# Patient Record
Sex: Male | Born: 1940 | Race: White | Hispanic: No | Marital: Married | Smoking: Never smoker
Health system: Southern US, Community
[De-identification: ages and names within clinical notes are randomized; demographics above are authoritative.]

## PROBLEM LIST (undated history)

## (undated) DIAGNOSIS — K5730 Diverticulosis of large intestine without perforation or abscess without bleeding: Secondary | ICD-10-CM

## (undated) DIAGNOSIS — I8390 Asymptomatic varicose veins of unspecified lower extremity: Secondary | ICD-10-CM

## (undated) DIAGNOSIS — Z8679 Personal history of other diseases of the circulatory system: Secondary | ICD-10-CM

## (undated) DIAGNOSIS — C61 Malignant neoplasm of prostate: Secondary | ICD-10-CM

## (undated) DIAGNOSIS — I739 Peripheral vascular disease, unspecified: Secondary | ICD-10-CM

## (undated) DIAGNOSIS — Z86010 Personal history of colonic polyps: Secondary | ICD-10-CM

## (undated) DIAGNOSIS — I498 Other specified cardiac arrhythmias: Secondary | ICD-10-CM

## (undated) DIAGNOSIS — T7840XA Allergy, unspecified, initial encounter: Secondary | ICD-10-CM

## (undated) DIAGNOSIS — I2510 Atherosclerotic heart disease of native coronary artery without angina pectoris: Secondary | ICD-10-CM

## (undated) DIAGNOSIS — E785 Hyperlipidemia, unspecified: Secondary | ICD-10-CM

## (undated) DIAGNOSIS — G459 Transient cerebral ischemic attack, unspecified: Secondary | ICD-10-CM

## (undated) HISTORY — DX: Transient cerebral ischemic attack, unspecified: G45.9

## (undated) HISTORY — DX: Diverticulosis of large intestine without perforation or abscess without bleeding: K57.30

## (undated) HISTORY — DX: Peripheral vascular disease, unspecified: I73.9

## (undated) HISTORY — DX: Malignant neoplasm of prostate: C61

## (undated) HISTORY — DX: Asymptomatic varicose veins of unspecified lower extremity: I83.90

## (undated) HISTORY — DX: Atherosclerotic heart disease of native coronary artery without angina pectoris: I25.10

## (undated) HISTORY — DX: Allergy, unspecified, initial encounter: T78.40XA

## (undated) HISTORY — DX: Hyperlipidemia, unspecified: E78.5

## (undated) HISTORY — DX: Other specified cardiac arrhythmias: I49.8

## (undated) HISTORY — DX: Personal history of other diseases of the circulatory system: Z86.79

## (undated) HISTORY — PX: POLYPECTOMY: SHX149

## (undated) HISTORY — DX: Personal history of colonic polyps: Z86.010

---

## 2003-04-08 ENCOUNTER — Ambulatory Visit (HOSPITAL_COMMUNITY): Admission: RE | Admit: 2003-04-08 | Discharge: 2003-04-08 | Payer: Self-pay | Admitting: Gastroenterology

## 2003-04-24 ENCOUNTER — Encounter: Admission: RE | Admit: 2003-04-24 | Discharge: 2003-04-24 | Payer: Self-pay | Admitting: General Surgery

## 2003-05-02 ENCOUNTER — Encounter (INDEPENDENT_AMBULATORY_CARE_PROVIDER_SITE_OTHER): Payer: Self-pay | Admitting: *Deleted

## 2003-05-02 ENCOUNTER — Ambulatory Visit (HOSPITAL_COMMUNITY): Admission: RE | Admit: 2003-05-02 | Discharge: 2003-05-02 | Payer: Self-pay | Admitting: General Surgery

## 2003-11-21 ENCOUNTER — Ambulatory Visit (HOSPITAL_COMMUNITY): Admission: RE | Admit: 2003-11-21 | Discharge: 2003-11-21 | Payer: Self-pay | Admitting: Internal Medicine

## 2003-11-21 ENCOUNTER — Ambulatory Visit: Payer: Self-pay | Admitting: Internal Medicine

## 2003-11-22 ENCOUNTER — Ambulatory Visit (HOSPITAL_COMMUNITY): Admission: RE | Admit: 2003-11-22 | Discharge: 2003-11-22 | Payer: Self-pay | Admitting: Internal Medicine

## 2003-11-26 ENCOUNTER — Ambulatory Visit: Payer: Self-pay

## 2003-11-28 ENCOUNTER — Ambulatory Visit: Payer: Self-pay | Admitting: Internal Medicine

## 2003-12-10 ENCOUNTER — Ambulatory Visit (HOSPITAL_COMMUNITY): Admission: RE | Admit: 2003-12-10 | Discharge: 2003-12-10 | Payer: Self-pay | Admitting: Neurology

## 2004-01-30 ENCOUNTER — Ambulatory Visit: Payer: Self-pay | Admitting: Internal Medicine

## 2005-11-04 ENCOUNTER — Ambulatory Visit: Payer: Self-pay | Admitting: Gastroenterology

## 2005-11-04 ENCOUNTER — Ambulatory Visit: Payer: Self-pay | Admitting: Internal Medicine

## 2005-11-04 LAB — CONVERTED CEMR LAB
ALT: 20 units/L (ref 0–40)
AST: 21 units/L (ref 0–37)
Albumin: 3.9 g/dL (ref 3.5–5.2)
Alkaline Phosphatase: 61 units/L (ref 39–117)
BUN: 7 mg/dL (ref 6–23)
Bacteria, U Microscopic: NEGATIVE /hpf
Basophils Absolute: 0 10*3/uL (ref 0.0–0.1)
Basophils Relative: 0.2 % (ref 0.0–1.0)
Bilirubin Urine: NEGATIVE
CO2: 30 meq/L (ref 19–32)
Calcium: 9.3 mg/dL (ref 8.4–10.5)
Chloride: 105 meq/L (ref 96–112)
Chol/HDL Ratio, serum: 7.1
Cholesterol: 258 mg/dL (ref 0–200)
Creatinine, Ser: 1.2 mg/dL (ref 0.4–1.5)
Crystals: NEGATIVE
Eosinophil percent: 3.1 % (ref 0.0–5.0)
GFR calc non Af Amer: 65 mL/min
Glomerular Filtration Rate, Af Am: 78 mL/min/{1.73_m2}
Glucose, Bld: 102 mg/dL — ABNORMAL HIGH (ref 70–99)
HCT: 47.8 % (ref 39.0–52.0)
HDL: 36.5 mg/dL — ABNORMAL LOW (ref >39.0)
Hemoglobin, Urine: NEGATIVE
Hemoglobin: 16.1 g/dL (ref 13.0–17.0)
Ketones, ur: NEGATIVE mg/dL
LDL DIRECT: 190.2 mg/dL
Lymphocytes Relative: 18.8 % (ref 12.0–46.0)
MCHC: 33.7 g/dL (ref 30.0–36.0)
MCV: 97.7 fL (ref 78.0–100.0)
Monocytes Absolute: 0.6 10*3/uL (ref 0.2–0.7)
Monocytes Relative: 13.1 % — ABNORMAL HIGH (ref 3.0–11.0)
Mucus, UA: NEGATIVE
Neutro Abs: 3.2 10*3/uL (ref 1.4–7.7)
Neutrophils Relative %: 64.8 % (ref 43.0–77.0)
Nitrite: NEGATIVE
PSA: 8.24 ng/mL — ABNORMAL HIGH (ref 0.10–4.00)
Platelets: 258 10*3/uL (ref 150–400)
Potassium: 4.2 meq/L (ref 3.5–5.1)
RBC / HPF: NONE SEEN
RBC: 4.89 M/uL (ref 4.22–5.81)
RDW: 11.7 % (ref 11.5–14.6)
Sodium: 140 meq/L (ref 135–145)
Specific Gravity, Urine: 1.01 (ref 1.000–1.03)
TSH: 1.99 microintl units/mL (ref 0.35–5.50)
Total Bilirubin: 0.8 mg/dL (ref 0.3–1.2)
Total Protein, Urine: NEGATIVE mg/dL
Total Protein: 6.7 g/dL (ref 6.0–8.3)
Triglyceride fasting, serum: 110 mg/dL (ref 0–149)
Urine Glucose: NEGATIVE mg/dL
Urobilinogen, UA: 0.2 (ref 0.0–1.0)
VLDL: 22 mg/dL (ref 0–40)
WBC: 4.8 10*3/uL (ref 4.5–10.5)
pH: 6 (ref 5.0–8.0)

## 2005-11-09 ENCOUNTER — Ambulatory Visit: Payer: Self-pay | Admitting: Internal Medicine

## 2005-11-16 ENCOUNTER — Ambulatory Visit: Payer: Self-pay | Admitting: Gastroenterology

## 2005-12-16 ENCOUNTER — Ambulatory Visit: Payer: Self-pay | Admitting: Internal Medicine

## 2005-12-16 LAB — CONVERTED CEMR LAB
ALT: 28 units/L (ref 0–40)
AST: 26 units/L (ref 0–37)
Chol/HDL Ratio, serum: 4.1
Cholesterol: 199 mg/dL (ref 0–200)
HDL: 48.4 mg/dL (ref >39.0)
LDL Cholesterol: 138 mg/dL — ABNORMAL HIGH (ref 0–99)
Triglyceride fasting, serum: 61 mg/dL (ref 0–149)
VLDL: 12 mg/dL (ref 0–40)

## 2006-11-29 ENCOUNTER — Encounter: Payer: Self-pay | Admitting: Internal Medicine

## 2006-12-18 DIAGNOSIS — Z860100 Personal history of colon polyps, unspecified: Secondary | ICD-10-CM

## 2006-12-18 DIAGNOSIS — E785 Hyperlipidemia, unspecified: Secondary | ICD-10-CM

## 2006-12-18 HISTORY — DX: Personal history of colon polyps, unspecified: Z86.0100

## 2006-12-18 HISTORY — DX: Personal history of colonic polyps: Z86.010

## 2006-12-18 HISTORY — DX: Hyperlipidemia, unspecified: E78.5

## 2007-03-13 ENCOUNTER — Encounter: Payer: Self-pay | Admitting: Internal Medicine

## 2007-07-24 ENCOUNTER — Encounter: Payer: Self-pay | Admitting: Internal Medicine

## 2007-08-01 ENCOUNTER — Ambulatory Visit: Admission: RE | Admit: 2007-08-01 | Discharge: 2007-09-12 | Payer: Self-pay | Admitting: Radiation Oncology

## 2007-08-01 ENCOUNTER — Encounter: Payer: Self-pay | Admitting: Internal Medicine

## 2007-09-20 ENCOUNTER — Ambulatory Visit: Payer: Self-pay | Admitting: Internal Medicine

## 2007-09-20 DIAGNOSIS — R5383 Other fatigue: Secondary | ICD-10-CM

## 2007-09-20 DIAGNOSIS — R5381 Other malaise: Secondary | ICD-10-CM

## 2007-09-20 DIAGNOSIS — I498 Other specified cardiac arrhythmias: Secondary | ICD-10-CM

## 2007-09-20 DIAGNOSIS — Z8679 Personal history of other diseases of the circulatory system: Secondary | ICD-10-CM

## 2007-09-20 DIAGNOSIS — K5730 Diverticulosis of large intestine without perforation or abscess without bleeding: Secondary | ICD-10-CM

## 2007-09-20 DIAGNOSIS — C61 Malignant neoplasm of prostate: Secondary | ICD-10-CM

## 2007-09-20 DIAGNOSIS — I739 Peripheral vascular disease, unspecified: Secondary | ICD-10-CM

## 2007-09-20 HISTORY — DX: Diverticulosis of large intestine without perforation or abscess without bleeding: K57.30

## 2007-09-20 HISTORY — DX: Malignant neoplasm of prostate: C61

## 2007-09-20 HISTORY — DX: Other specified cardiac arrhythmias: I49.8

## 2007-09-20 HISTORY — DX: Personal history of other diseases of the circulatory system: Z86.79

## 2007-09-20 HISTORY — DX: Peripheral vascular disease, unspecified: I73.9

## 2007-09-27 ENCOUNTER — Emergency Department (HOSPITAL_COMMUNITY): Admission: EM | Admit: 2007-09-27 | Discharge: 2007-09-27 | Payer: Self-pay | Admitting: Emergency Medicine

## 2007-11-16 ENCOUNTER — Ambulatory Visit: Payer: Self-pay | Admitting: Internal Medicine

## 2007-12-03 ENCOUNTER — Ambulatory Visit: Payer: Self-pay | Admitting: Internal Medicine

## 2007-12-03 DIAGNOSIS — G459 Transient cerebral ischemic attack, unspecified: Secondary | ICD-10-CM

## 2007-12-03 DIAGNOSIS — R209 Unspecified disturbances of skin sensation: Secondary | ICD-10-CM

## 2007-12-03 HISTORY — DX: Transient cerebral ischemic attack, unspecified: G45.9

## 2007-12-07 ENCOUNTER — Encounter: Payer: Self-pay | Admitting: Internal Medicine

## 2007-12-26 ENCOUNTER — Ambulatory Visit: Payer: Self-pay

## 2007-12-26 ENCOUNTER — Encounter: Payer: Self-pay | Admitting: Internal Medicine

## 2008-04-04 ENCOUNTER — Encounter: Payer: Self-pay | Admitting: Internal Medicine

## 2008-08-07 ENCOUNTER — Encounter: Payer: Self-pay | Admitting: Internal Medicine

## 2008-10-07 ENCOUNTER — Encounter (INDEPENDENT_AMBULATORY_CARE_PROVIDER_SITE_OTHER): Payer: Self-pay | Admitting: *Deleted

## 2008-12-08 ENCOUNTER — Encounter: Payer: Self-pay | Admitting: Internal Medicine

## 2009-04-15 ENCOUNTER — Ambulatory Visit: Payer: Self-pay | Admitting: Internal Medicine

## 2009-04-15 DIAGNOSIS — T783XXA Angioneurotic edema, initial encounter: Secondary | ICD-10-CM | POA: Insufficient documentation

## 2009-04-20 ENCOUNTER — Encounter (INDEPENDENT_AMBULATORY_CARE_PROVIDER_SITE_OTHER): Payer: Self-pay | Admitting: *Deleted

## 2009-05-20 ENCOUNTER — Encounter (INDEPENDENT_AMBULATORY_CARE_PROVIDER_SITE_OTHER): Payer: Self-pay | Admitting: *Deleted

## 2009-05-22 ENCOUNTER — Ambulatory Visit: Payer: Self-pay | Admitting: Gastroenterology

## 2009-06-01 ENCOUNTER — Encounter: Payer: Self-pay | Admitting: Internal Medicine

## 2009-06-05 ENCOUNTER — Ambulatory Visit: Payer: Self-pay | Admitting: Gastroenterology

## 2009-06-05 HISTORY — PX: COLONOSCOPY: SHX174

## 2009-06-09 ENCOUNTER — Encounter: Payer: Self-pay | Admitting: Gastroenterology

## 2009-10-02 ENCOUNTER — Encounter: Payer: Self-pay | Admitting: Internal Medicine

## 2009-10-23 ENCOUNTER — Encounter: Payer: Self-pay | Admitting: Internal Medicine

## 2010-01-28 ENCOUNTER — Encounter: Payer: Self-pay | Admitting: Internal Medicine

## 2010-02-06 ENCOUNTER — Encounter: Payer: Self-pay | Admitting: Neurology

## 2010-02-06 ENCOUNTER — Encounter: Payer: Self-pay | Admitting: Internal Medicine

## 2010-02-14 LAB — CONVERTED CEMR LAB
ALT: 20 units/L (ref 0–53)
ALT: 21 units/L (ref 0–53)
AST: 20 units/L (ref 0–37)
AST: 22 units/L (ref 0–37)
Albumin: 4 g/dL (ref 3.5–5.2)
Albumin: 4.2 g/dL (ref 3.5–5.2)
Alkaline Phosphatase: 58 units/L (ref 39–117)
Alkaline Phosphatase: 65 units/L (ref 39–117)
BUN: 7 mg/dL (ref 6–23)
BUN: 9 mg/dL (ref 6–23)
Basophils Absolute: 0 10*3/uL (ref 0.0–0.1)
Basophils Absolute: 0 10*3/uL (ref 0.0–0.1)
Basophils Relative: 0.2 % (ref 0.0–3.0)
Basophils Relative: 0.5 % (ref 0.0–3.0)
Bilirubin Urine: NEGATIVE
Bilirubin Urine: NEGATIVE
Bilirubin, Direct: 0.1 mg/dL (ref 0.0–0.3)
Bilirubin, Direct: 0.1 mg/dL (ref 0.0–0.3)
CO2: 29 meq/L (ref 19–32)
CO2: 31 meq/L (ref 19–32)
Calcium: 8.7 mg/dL (ref 8.4–10.5)
Calcium: 9 mg/dL (ref 8.4–10.5)
Chloride: 102 meq/L (ref 96–112)
Chloride: 104 meq/L (ref 96–112)
Cholesterol: 211 mg/dL — ABNORMAL HIGH (ref 0–200)
Cholesterol: 219 mg/dL (ref 0–200)
Creatinine, Ser: 1 mg/dL (ref 0.4–1.5)
Creatinine, Ser: 1.1 mg/dL (ref 0.4–1.5)
Direct LDL: 161.1 mg/dL
Direct LDL: 207.7 mg/dL
Eosinophils Absolute: 0.2 10*3/uL (ref 0.0–0.7)
Eosinophils Absolute: 0.2 10*3/uL (ref 0.0–0.7)
Eosinophils Relative: 3.5 % (ref 0.0–5.0)
Eosinophils Relative: 3.9 % (ref 0.0–5.0)
GFR calc Af Amer: 86 mL/min
GFR calc non Af Amer: 71 mL/min
GFR calc non Af Amer: 78.86 mL/min (ref >60)
Glucose, Bld: 91 mg/dL (ref 70–99)
Glucose, Bld: 93 mg/dL (ref 70–99)
HCT: 46.2 % (ref 39.0–52.0)
HCT: 49.5 % (ref 39.0–52.0)
HDL: 36.1 mg/dL — ABNORMAL LOW (ref >39.0)
HDL: 40.7 mg/dL (ref >39.00)
Hemoglobin, Urine: NEGATIVE
Hemoglobin, Urine: NEGATIVE
Hemoglobin: 15.4 g/dL (ref 13.0–17.0)
Hemoglobin: 17.1 g/dL — ABNORMAL HIGH (ref 13.0–17.0)
Ketones, ur: NEGATIVE mg/dL
Ketones, ur: NEGATIVE mg/dL
Leukocytes, UA: NEGATIVE
Leukocytes, UA: NEGATIVE
Lymphocytes Relative: 15.9 % (ref 12.0–46.0)
Lymphocytes Relative: 17.2 % (ref 12.0–46.0)
Lymphs Abs: 0.8 10*3/uL (ref 0.7–4.0)
MCHC: 33.4 g/dL (ref 30.0–36.0)
MCHC: 34.5 g/dL (ref 30.0–36.0)
MCV: 97.3 fL (ref 78.0–100.0)
MCV: 98.7 fL (ref 78.0–100.0)
Monocytes Absolute: 0.6 10*3/uL (ref 0.1–1.0)
Monocytes Absolute: 0.6 10*3/uL (ref 0.1–1.0)
Monocytes Relative: 11 % (ref 3.0–12.0)
Monocytes Relative: 11.2 % (ref 3.0–12.0)
Neutro Abs: 3.6 10*3/uL (ref 1.4–7.7)
Neutro Abs: 3.9 10*3/uL (ref 1.4–7.7)
Neutrophils Relative %: 67.6 % (ref 43.0–77.0)
Neutrophils Relative %: 69 % (ref 43.0–77.0)
Nitrite: NEGATIVE
Nitrite: NEGATIVE
PSA: 7.02 ng/mL — ABNORMAL HIGH (ref 0.10–4.00)
PSA: 8.7 ng/mL — ABNORMAL HIGH (ref 0.10–4.00)
Platelets: 249 10*3/uL (ref 150–400)
Platelets: 255 10*3/uL (ref 150.0–400.0)
Potassium: 4.2 meq/L (ref 3.5–5.1)
Potassium: 4.6 meq/L (ref 3.5–5.1)
RBC: 4.68 M/uL (ref 4.22–5.81)
RBC: 5.09 M/uL (ref 4.22–5.81)
RDW: 12 % (ref 11.5–14.6)
RDW: 12.2 % (ref 11.5–14.6)
Sodium: 138 meq/L (ref 135–145)
Sodium: 139 meq/L (ref 135–145)
Specific Gravity, Urine: <=1.005 (ref 1.000–1.03)
Specific Gravity, Urine: <=1.005 (ref 1.000–1.030)
TSH: 2.01 microintl units/mL (ref 0.35–5.50)
TSH: 2.72 microintl units/mL (ref 0.35–5.50)
Total Bilirubin: 0.7 mg/dL (ref 0.3–1.2)
Total Bilirubin: 1.1 mg/dL (ref 0.3–1.2)
Total CHOL/HDL Ratio: 5
Total CHOL/HDL Ratio: 6.1
Total Protein, Urine: NEGATIVE mg/dL
Total Protein, Urine: NEGATIVE mg/dL
Total Protein: 6.8 g/dL (ref 6.0–8.3)
Total Protein: 7.3 g/dL (ref 6.0–8.3)
Triglycerides: 76 mg/dL (ref 0–149)
Triglycerides: 86 mg/dL (ref 0.0–149.0)
Urine Glucose: NEGATIVE mg/dL
Urine Glucose: NEGATIVE mg/dL
Urobilinogen, UA: 0.2 (ref 0.0–1.0)
Urobilinogen, UA: 0.2 (ref 0.0–1.0)
VLDL: 15 mg/dL (ref 0–40)
VLDL: 17.2 mg/dL (ref 0.0–40.0)
WBC: 5.2 10*3/uL (ref 4.5–10.5)
WBC: 5.7 10*3/uL (ref 4.5–10.5)
pH: 6 (ref 5.0–8.0)
pH: 6.5 (ref 5.0–8.0)

## 2010-02-18 NOTE — Miscellaneous (Signed)
Summary: Immunization Entry   Immunization History:  Influenza Immunization History:    Influenza:  historical (10/23/2009)  [facility] [address] [place]  Vaccine, Fluvirin Site, Left Deltoid, IM Lot # [id number] Exp. Date, July 07, 2010 Date Administered, 10/23/2009

## 2010-02-18 NOTE — Procedures (Signed)
Summary: Colonoscopy  Patient: [name] [name] Note: All result statuses are Final unless otherwise noted.  Tests: (1) Colonoscopy (COL)   COL Colonoscopy           DONE     [facility]     [address]     [place], [place]  [address]           COLONOSCOPY PROCEDURE REPORT           PATIENT:  [name] [name], [name] [name]  MR#:  [id number]     BIRTHDATE:  11/16/1940, 68 yrs. old  GENDER:  male     ENDOSCOPIST:  Vania Rea. Jarold Motto, MD, [facility]     REF. BY:     PROCEDURE DATE:  06/05/2009     PROCEDURE:  Colonoscopy with biopsy and snare polypectomy     ASA CLASS:  Class II     INDICATIONS:  history of colon cancer, family history of colon     cancer, history of polyps     MEDICATIONS:   Fentanyl 50 mcg IV, Versed 7 mg IV           DESCRIPTION OF PROCEDURE:   After the risks benefits and     alternatives of the procedure were thoroughly explained, informed     consent was obtained.  Digital rectal exam was performed and     revealed no abnormalities.   The LB [id number] endoscope     was introduced through the anus and advanced to the cecum, which     was identified by both the appendix and ileocecal valve, without     limitations.  The quality of the prep was excellent, using     MoviPrep.  The instrument was then slowly withdrawn as the colon     was fully examined.     <<PROCEDUREIMAGES>>           FINDINGS:  A sessile polyp was found. 1.5 cm sessile serrated     adenoma in cecal base hot snare excised.  Nodular mucosa was     found. rectal nodularity with focal erosions biopsied.SEE PICTURE.     Retroflexed views in the rectum revealed no abnormalities.    The     scope was then withdrawn from the patient and the procedure     completed.           COMPLICATIONS:  None     ENDOSCOPIC IMPRESSION:     1) Sessile polyp     2) Nodular mucosa     R/O RECURRENCE IN RECTUM WHERE PRIOR TUMOR TRANS -ANALLY     REMOVED.     RECOMMENDATIONS:     1) Await biopsy results  REPEAT EXAM:  No           ______________________________     Vania Rea. Jarold Motto, MD, Clementeen Graham           CC:  Corwin Levins, MD           n.     Rosalie DoctorMarland Kitchen   Vania Rea. Candis Kabel at 06/05/2009 02:12 PM           Jetty Duhamel, [id number]  Note: An exclamation mark (!) indicates a result that was not dispersed into the flowsheet. Document Creation Date: 06/05/2009 2:13 PM _______________________________________________________________________  (1) Order result status: Final Collection or observation date-time: 06/05/2009 14:01 Requested date-time:  Receipt date-time:  Reported date-time:  Referring Physician:   Ordering Physician: Sheryn Bison [phone]) Specimen Source:  Source: Kem Parkinson  Filler Order Number: [id number] Lab site:   Appended Document: Colonoscopy     Procedures Next Due Date:    Colonoscopy: 05/2010

## 2010-02-18 NOTE — Letter (Signed)
Summary: [facility] Instructions  [facility]  [address] [place], [place] [address]   Phone: [phone]  Fax: [phone]       ONA RATHERT    12-19-1940    MRN: [id number]        Procedure Day /Date:  Friday 06/05/2009     Arrival Time: 12:30 pm      Procedure Time: 1:30 pm     Location of Procedure:                    _x _   Endoscopy Center (4th Floor)                        PREPARATION FOR COLONOSCOPY WITH MOVIPREP   Starting 5 days prior to your procedure Sunday 5/15 do not eat nuts, seeds, popcorn, corn, beans, peas,  salads, or any raw vegetables.  Do not take any fiber supplements (e.g. Metamucil, Citrucel, and Benefiber).  THE DAY BEFORE YOUR PROCEDURE         DATE: Thursday 5/19  1.  Drink clear liquids the entire day-NO SOLID FOOD  2.  Do not drink anything colored red or purple.  Avoid juices with pulp.  No orange juice.  3.  Drink at least 64 oz. (8 glasses) of fluid/clear liquids during the day to prevent dehydration and help the prep work efficiently.  CLEAR LIQUIDS INCLUDE: Water Jello Ice Popsicles Tea (sugar ok, no milk/cream) Powdered fruit flavored drinks Coffee (sugar ok, no milk/cream) Gatorade Juice: apple, white grape, white cranberry  Lemonade Clear bullion, consomm, broth Carbonated beverages (any kind) Strained chicken noodle soup Hard Candy                             4.  In the morning, mix first dose of MoviPrep solution:    Empty 1 Pouch A and 1 Pouch B into the disposable container    Add lukewarm drinking water to the top line of the container. Mix to dissolve    Refrigerate (mixed solution should be used within 24 hrs)  5.  Begin drinking the prep at 5:00 p.m. The MoviPrep container is divided by 4 marks.   Every 15 minutes drink the solution down to the next mark (approximately 8 oz) until the full liter is complete.   6.  Follow completed prep with 16 oz of clear liquid of your choice (Nothing  red or purple).  Continue to drink clear liquids until bedtime.  7.  Before going to bed, mix second dose of MoviPrep solution:    Empty 1 Pouch A and 1 Pouch B into the disposable container    Add lukewarm drinking water to the top line of the container. Mix to dissolve    Refrigerate  THE DAY OF YOUR PROCEDURE      DATE: Friday 5/20  Beginning at 8:30 a.m. (5 hours before procedure):         1. Every 15 minutes, drink the solution down to the next mark (approx 8 oz) until the full liter is complete.  2. Follow completed prep with 16 oz. of clear liquid of your choice.    3. You may drink clear liquids until 11:30 am (2 HOURS BEFORE PROCEDURE).   MEDICATION INSTRUCTIONS  Unless otherwise instructed, you should take regular prescription medications with a small sip of water   as early as possible the morning of  your procedure.           OTHER INSTRUCTIONS  You will need a responsible adult at least 70 years of age to accompany you and drive you home.   This person must remain in the waiting room during your procedure.  Wear loose fitting clothing that is easily removed.  Leave jewelry and other valuables at home.  However, you may wish to bring a book to read or  an iPod/MP3 player to listen to music as you wait for your procedure to start.  Remove all body piercing jewelry and leave at home.  Total time from sign-in until discharge is approximately 2-3 hours.  You should go home directly after your procedure and rest.  You can resume normal activities the  day after your procedure.  The day of your procedure you should not:   Drive   Make legal decisions   Operate machinery   Drink alcohol   Return to work  You will receive specific instructions about eating, activities and medications before you leave.    The above instructions have been reviewed and explained to me by  Ezra Sites RN  May 22, 2009 9:19 AM      I fully understand and can  verbalize these instructions _____________________________ Date _________

## 2010-02-18 NOTE — Assessment & Plan Note (Signed)
Summary: YEARLY-PER PT INSUR WILL PAY B4 LABS STC   Vital Signs:  Patient profile:   70 year old male Height:      70 inches Weight:      191.50 pounds BMI:     27.58 O2 Sat:      98 % on Room air Temp:     97 degrees F oral Pulse rate:   57 / minute BP sitting:   132 / 80  (left arm) Cuff size:   regular  Vitals Entered ByZella Ball Ewing (April 15, 2009 8:46 AM)  O2 Flow:  Room air  Preventive Care Screening  Last Tetanus Booster:    Date:  01/18/2004    Results:  Tdap   CC: Yearly/RE   CC:  Yearly/RE.  History of Present Illness: hs new  blistex new last night, today with asymmetric swelling to the the lower lip; Pt denies CP, sob, doe, wheezing, orthopnea, pnd, worsening LE edema, palps, dizziness or syncope   Pt denies new neuro symptoms such as headache, facial or extremity weakness   overall good med compliance.  good tolerance.    Preventive Screening-Counseling & Management      Drug Use:  no.    Problems Prior to Update: 1)  Preventive Health Care  (ICD-V70.0) 2)  Angioedema  (ICD-995.1) 3)  Tia  (ICD-435.9) 4)  Paresthesia  (ICD-782.0) 5)  Bradycardia  (ICD-427.89) 6)  Special Screening Malig Neoplasms Other Sites  (ICD-V76.49) 7)  Fatigue  (ICD-780.79) 8)  Peripheral Vascular Disease  (ICD-443.9) 9)  Diverticulosis, Colon  (ICD-562.10) 10)  Cerebrovascular Accident, Hx of  (ICD-V12.50) 11)  Adenocarcinoma, Prostate  (ICD-185) 12)  Hyperlipidemia  (ICD-272.4) 13)  Colonic Polyps, Hx of  (ICD-V12.72)  Medications Prior to Update: 1)  Ecotrin 325 Mg Tbec (Aspirin) .Marland Kitchen.. 1po Once Daily 2)  Simvastatin 40 Mg Tabs (Simvastatin) .Marland Kitchen.. 1 By Mouth Once Daily  Current Medications (verified): 1)  Ecotrin 325 Mg Tbec (Aspirin) .Marland Kitchen.. 1po Once Daily 2)  Simvastatin 40 Mg Tabs (Simvastatin) .Marland Kitchen.. 1 By Mouth Once Daily  Allergies (verified): 1)  ! 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 Diverticulosis, colon prostate cancer hx of left ulnar neuropathy  Past Surgical History: Last updated: 09/20/2007 Colonoscopy-11/16/2005 EUS-04/08/2003   Family History: Last updated: 09/20/2007 brother with colon cancer father with MI, died 72 yo mother with stroke  Social History: Last updated: 04/15/2009 Never Smoked Alcohol use-no Married 4 children work - Community education officer Drug use-no  Risk Factors: Smoking Status: never (09/20/2007)  Social History: Never Smoked Alcohol use-no Married 4 children work - Brewing technologist use-no Drug Use:  no  Review of Systems  The patient denies anorexia, fever, weight gain, vision loss, decreased hearing, hoarseness, chest pain, syncope, dyspnea on exertion, peripheral edema, prolonged cough, headaches, hemoptysis, abdominal pain, melena, hematochezia, severe indigestion/heartburn, hematuria, muscle weakness, suspicious skin lesions, transient blindness, difficulty walking, depression, unusual weight change, abnormal bleeding, enlarged lymph nodes, and angioedema.         all otherwise negative per pt -    Physical Exam  General:  alert and overweight-appearing.   Head:  normocephalic and atraumatic.   Eyes:  vision grossly intact, pupils equal, and pupils round.   Ears:  R ear normal and L ear normal.   Nose:  no external deformity and  no nasal discharge.   Mouth:  no gingival abnormalities and pharynx pink and moist.   Neck:  supple and no masses.   Lungs:  normal respiratory effort and normal breath sounds.   Heart:  normal rate and regular rhythm.   Abdomen:  soft, non-tender, and normal bowel sounds.   Msk:  no joint tenderness and no joint swelling.   Extremities:  no edema, no erythema  Neurologic:  cranial nerves II-XII intact, strength normal in all extremities, and sensation intact  to light touch.   Skin:  lower right lip 1+ swollen, nontender Psych:  not anxious appearing and not depressed appearing.     Impression & Recommendations:  Problem # 1:  Preventive Health Care (ICD-V70.0)  Overall doing well, age appropriate education and counseling updated and referral for appropriate preventive services done unless declined, immunizations up to date or declined, diet counseling done if overweight, urged to quit smoking if smokes , most recent labs reviewed and current ordered if appropriate, ecg reviewed or declined (interpretation per ECG scanned in the EMR if done); information regarding Medicare Prevention requirements given if appropriate   Orders: EKG w/ Interpretation (93000) TLB-BMP (Basic Metabolic Panel-BMET) (80048-METABOL) TLB-CBC Platelet - w/Differential (85025-CBCD) TLB-Hepatic/Liver Function Pnl (80076-HEPATIC) TLB-Lipid Panel (80061-LIPID) TLB-TSH (Thyroid Stimulating Hormone) (84443-TSH) TLB-PSA (Prostate Specific Antigen) (84153-PSA) TLB-Udip ONLY (81003-UDIP)  Problem # 2:  HYPERLIPIDEMIA (ICD-272.4)  His updated medication list for this problem includes:    Simvastatin 40 Mg Tabs (Simvastatin) .Marland Kitchen... 1 by mouth once daily  Labs Reviewed: SGOT: 20 (09/20/2007)   SGPT: 20 (09/20/2007)   HDL:36.1 (09/20/2007), 48.4 (12/16/2005)  LDL:DEL (09/20/2007), 138 (12/16/2005)  Chol:219 (09/20/2007), 199 (12/16/2005)  Trig:76 (09/20/2007), 61 (12/16/2005) not taking med;  to re-start med, f/u lab in 4 wks  Problem # 3:  COLONIC POLYPS, HX OF (ICD-V12.72)  due for f/u - will refer for colonoscopy  Orders: Gastroenterology Referral (GI)  Problem # 4:  ADENOCARCINOMA, PROSTATE (ICD-185) due for  f/u PSA now, and plans to see urology in May 2011 for f/u biopsy  Problem # 5:  ANGIOEDEMA (ICD-995.1) mild, for OTC antihist, no further bistex  Complete Medication List: 1)  Ecotrin 325 Mg Tbec (Aspirin) .Marland Kitchen.. 1po once daily 2)  Simvastatin 40 Mg Tabs  (Simvastatin) .Marland Kitchen.. 1 by mouth once daily  Patient Instructions: 1)  Please take all new medications as prescribed 2)  Continue all previous medications as before this visit 3)  please return in 4 wks for LAB only: 4)  Hepatic Panel prior to visit, ICD-9: v58.69 5)  Lipid Panel prior to visit, ICD-9: 272.0 6)  You will be contacted about the referral(s) to: colononscopy 7)  please do not use the blistex or chapstick in the future 8)  please take claritin for the lip swelling as needed  9)  Please schedule a follow-up appointment in 1 year or sooner if needed Prescriptions: SIMVASTATIN 40 MG TABS (SIMVASTATIN) 1 by mouth once daily  #90 x 3   Entered and Authorized by:   Corwin Levins MD   Signed by:   Corwin Levins MD on 04/15/2009   Method used:   Print then Give to Patient   RxID:   [id number] SIMVASTATIN 40 MG TABS (SIMVASTATIN) 1 by mouth once daily  #90 x 3   Entered and Authorized by:   Corwin Levins MD   Signed by:   Corwin Levins MD on 04/15/2009   Method used:   Electronically to  [facility]  [address] # [id number]* (retail)       [address]       [place], [place]  [address]       Ph: [phone] or [phone]       Fax: [phone]   RxID:   [id number]

## 2010-02-18 NOTE — Miscellaneous (Signed)
Summary: [facility] PV  Clinical Lists Changes  Medications: Added new medication of MOVIPREP 100 GM  SOLR (PEG-KCL-NACL-NASULF-NA ASC-C) As per prep instructions. - Signed Rx of MOVIPREP 100 GM  SOLR (PEG-KCL-NACL-NASULF-NA ASC-C) As per prep instructions.;  #1 x 0;  Signed;  Entered by: Ezra Sites RN;  Authorized by: Mardella Layman MD [facility];  Method used: Electronically to [facility] [address] # [id number]*, [address] [place], [place], [place]  [address], Ph: [phone] or [phone], Fax: [phone] Allergies: Changed allergy or adverse reaction from NIACIN to NIACIN    Prescriptions: MOVIPREP 100 GM  SOLR (PEG-KCL-NACL-NASULF-NA ASC-C) As per prep instructions.  #1 x 0   Entered by:   Ezra Sites RN   Authorized by:   Mardella Layman MD [facility]   Signed by:   Ezra Sites RN on 05/22/2009   Method used:   Electronically to        [facility] [address] [id number]* (retail)       [address]       [place], [place]  [address]       Ph: [phone] or [phone]       Fax: [phone]   RxID:   [id number]   Appended Document: [facility] PV Pt called back after PV to say that he was scheduled for a prostate bx 5 days before colonoscopy.  I talked with Dr. Jarold Motto, ok to proceed with colonoscopy.  Pt. notified.

## 2010-02-18 NOTE — Letter (Signed)
[facility]: [facility]   Imported By: Sherian Rein 06/05/2009 15:13:03  _____________________________________________________________________  External Attachment:    Type:   Image     Comment:   External Document

## 2010-02-18 NOTE — Letter (Signed)
Summary: [facility]   Imported By: Sherian Rein 10/08/2009 09:23:05  _____________________________________________________________________  External Attachment:    Type:   Image     Comment:   External Document

## 2010-02-18 NOTE — Letter (Signed)
Summary: Patient Notice- Polyp Results  [name] [name]  [address] [place], [place] [address]   Phone: [phone]  Fax: [phone]        Jun 09, 2009 MRN: [id number]    [name] [name] [address] [place], [place]  [address]    Dear [name],  I am pleased to inform you that the colon polyp(s) removed during your recent colonoscopy was (were) found to be benign (no cancer detected) upon pathologic examination.  I recommend you have a repeat colonoscopy examination in 1_ years to look for recurrent polyps, as having colon polyps increases your risk for having recurrent polyps or even colon cancer in the future.  Should you develop new or worsening symptoms of abdominal pain, bowel habit changes or bleeding from the rectum or bowels, please schedule an evaluation with either your primary care physician or with me.  Additional information/recommendations:  x__ No further action with [name] is needed at this time. Please      follow-up with your primary care physician for your other healthcare      needs.  __ Please call [phone] to schedule a return visit to review your      situation.  __ Please keep your follow-up visit as already scheduled.  __ Continue treatment plan as outlined the day of your exam.  Please call us if you are having persistent problems or have questions about your condition that have not been fully answered at this time.  Sincerely,  Mardella Layman MD [facility]  This letter has been electronically signed by your physician.  Appended Document: Patient Notice- Polyp Results letter mailed

## 2010-02-18 NOTE — Letter (Signed)
Summary: Previsit letter  [facility]  [address] [place], [place] [address]   Phone: [phone]  Fax: [phone]       04/20/2009 MRN: [id number]  [name] [name] [address] [place], [place]  [address]  Dear [name] [name],  Welcome to the Gastroenterology Division at [facility].    You are scheduled to see a nurse for your pre-procedure visit on 05-22-09 at 9:00a.m. on the 3rd floor at [facility], [address]  We ask that you try to arrive at our office 15 minutes prior to your appointment time to allow for check-in.  Your nurse visit will consist of discussing your medical and surgical history, your immediate family medical history, and your medications.    Please bring a complete list of all your medications or, if you prefer, bring the medication bottles and we will list them.  We will need to be aware of both prescribed and over the counter drugs.  We will need to know exact dosage information as well.  If you are on blood thinners (Coumadin, Plavix, Aggrenox, Ticlid, etc.) please call our office today/prior to your appointment, as we need to consult with your physician about holding your medication.   Please be prepared to read and sign documents such as consent forms, a financial agreement, and acknowledgement forms.  If necessary, and with your consent, a friend or relative is welcome to sit-in on the nurse visit with you.  Please bring your insurance card so that we may make a copy of it.  If your insurance requires a referral to see a specialist, please bring your referral form from your primary care physician.  No co-pay is required for this nurse visit.     If you cannot keep your appointment, please call [phone] to cancel or reschedule prior to your appointment date.  This allows [place] the opportunity to schedule an appointment for another patient in need of care.    Thank you for choosing [facility] for your medical  needs.  We appreciate the opportunity to care for you.  Please visit [place] at our website  to learn more about our practice.                     Sincerely.                                                                                                                   The Gastroenterology Division

## 2010-02-22 ENCOUNTER — Encounter (INDEPENDENT_AMBULATORY_CARE_PROVIDER_SITE_OTHER): Payer: Medicare Other | Admitting: Vascular Surgery

## 2010-02-22 DIAGNOSIS — R609 Edema, unspecified: Secondary | ICD-10-CM

## 2010-02-22 DIAGNOSIS — I83893 Varicose veins of bilateral lower extremities with other complications: Secondary | ICD-10-CM

## 2010-02-26 NOTE — Consult Note (Signed)
NEW PATIENT CONSULTATION  [name] [name], [name] [name] DOB:  March 04, 1940                                       02/22/2010 BJYNW#:[id number]  The patient is a 70 year old male patient referred for severe venous insufficiency of both legs, left greater than right.  He has noted bulging varicosities in the left lateral calf and medial calf area for the last several years, which have become more prominent and are becoming increasingly symptomatic.  He develops aching throbbing and burning discomfort, particularly in the lateral calf area, as the day progresses.  He has no history of deep vein thrombosis, thrombophlebitis, bleeding or ulceration.  His right leg has increasing symptoms as well, which involve a large patch of prominent veins in the anterior thigh and some varicosities in the medial thigh.  He has not worn elastic compression stockings nor tried elevation of his legs on a regular basis nor take pain medication.  It is beginning to affect his daily living with the symptoms becoming more prominent.  CHRONIC MEDICAL PROBLEMS: 1. Hyperlipidemia. 2. A history of remote TIAs, not recently. 3. Negative for diabetes, hypertension, coronary artery disease, COPD     or stroke.  SOCIAL HISTORY:  He is married.  He is an Nutritional therapist.  Does not use tobacco or alcohol.  FAMILY HISTORY:  Positive for coronary artery disease as in his father. Negative for diabetes and stroke.  REVIEW OF SYSTEMS:  Entirely negative, a complete review of systems. Denies any chest pain, dyspnea on exertion, PND, orthopnea, chronic bronchitis or pneumonia.  Did have the one episode of TIAs in the past with blurred vision, but that has resolved.  All other systems in complete review of systems are negative.  PHYSICAL EXAMINATION:  Blood pressure 149/83, heart rate 65, respirations 20.  General:  He is a well-developed, well-nourished male who is no apparent distress.  He is alert and  oriented x3.  HEENT:  Exam is normal for age.  EOMs intact.  Lungs:  Clear to auscultation.  No rhonchi or wheezing.  Cardiovascular:  Regular rhythm.  No murmurs. Carotid pulses 3-plus.  No audible bruits.  Abdomen:  Soft, nontender, with no masses.  Musculoskeletal:  Free of major deformities. Neurologic:  Normal.  Skin:  Free of rashes.  Lower extremity exam reveals 3+ femoral popliteal, dorsalis pedis pulses bilaterally.  His left leg has bulging varicosities in the lateral calf and in the medial calf at the knee level over the greater saphenous system with prominent veins throughout.  He has no hyperpigmentation or ulceration.  Right leg has large patch of reticular veins in the anterior thigh communicating with the great saphenous system with some varicosities in the medial thigh and no hyperpigmentation or ulceration.  Today I ordered bilateral venous duplex studies, which I reviewed and interpreted.  There is no DVT.  He does have gross reflux throughout both great saphenous systems from the knee to the saphenofemoral junction supplying these varicosities.  There is reflux in the small saphenous system.  I think this patient is having increasing symptoms related to his venous disease which are beginning to affect his daily living.  We will treat him with long-leg elastic compression stockings (20 mm-30 mm gradient) as well as elevation daily and ibuprofen.  He will return in 3 months and if there has been no improvement in his symptoms, I think  he should have the following: 1. Laser ablation of the left great saphenous vein. 2. Laser ablation of the right great saphenous vein, to be followed by     2 courses of sclerotherapy in the right leg.  He will return in 3 months.    Quita Skye Hart [place], M.D. Electronically Signed  JDL/MEDQ  D:  02/22/2010  T:  02/23/2010  Job:  [id number]

## 2010-02-26 NOTE — Procedures (Unsigned)
LOWER EXTREMITY VENOUS REFLUX EXAM  INDICATION:  Bilateral edema and varicose veins.  EXAM:  Using color-flow imaging and pulse Doppler spectral analysis, the right and left common femoral, superficial femoral, popliteal, posterior tibial, greater and lesser saphenous veins are evaluated.  There is evidence suggesting deep venous insufficiency in the right and left lower extremities.  The right and left saphenofemoral junction is not competent with Reflux of >556milliseconds. The right and left GSV is not competent with Reflux of >566milliseconds with the caliber as described below.  The right and left proximal short saphenous vein demonstrates competency.  GSV Diameter (used if found to be incompetent only)                                           Right          Left Proximal Greater Saphenous Vein           0.42 cm        1.30 cm Proximal-to-mid-thigh                     0.30 cm        0.51 cm Mid thigh                                 0.40 cm        0.49 cm Mid-distal thigh                          cm             cm Distal thigh                              0.35 cm        0.43 cm Knee                                      0.35 cm        0.26 cm  IMPRESSION: 1. The right and left greater saphenous veins are not competent with     reflux >545milliseconds. 2. The left greater saphenous vein is tortuous in the distal thigh and     calf. 3. The right greater saphenous vein is not tortuous. 4. The deep venous system is not competent with Reflux of     >591milliseconds. 5. The right and left short saphenous vein is competent.  ___________________________________________ Quita Skye. Hart [place], M.D.  EM/MEDQ  D:  02/22/2010  T:  02/22/2010  Job:  [id number]

## 2010-03-10 NOTE — Letter (Signed)
Summary: [facility] MD  [facility] MD   Imported By: Lester Ladera 03/01/2010 10:50:34  _____________________________________________________________________  External Attachment:    Type:   Image     Comment:   External Document

## 2010-05-31 ENCOUNTER — Ambulatory Visit (INDEPENDENT_AMBULATORY_CARE_PROVIDER_SITE_OTHER): Payer: Medicare Other | Admitting: Vascular Surgery

## 2010-05-31 DIAGNOSIS — I83893 Varicose veins of bilateral lower extremities with other complications: Secondary | ICD-10-CM

## 2010-06-01 NOTE — Assessment & Plan Note (Signed)
OFFICE VISIT  [name] [name], [name] [name] DOB:  1940-08-15                                       05/31/2010 [id number]#:[id number]  Patient returns for further follow-up regarding his severe venous insufficiency of both lower extremities.  He has painful varicosities in both legs, which we have treated for last 3 months with long-leg elastic compression stockings, elevation, and ibuprofen.  He has continued to have throbbing and burning discomfort, particularly in the lateral calf area as the day progresses.  He has had no history of DVT, thrombophlebitis, bleeding, or ulceration.  He is having increasing patches of superficial varicosities in the right medial anterior thigh area.  He states that the bulges are actually worsened by the long-leg stockings, and he removes them.  On examination today, he has 3+ femoral, popliteal, and dorsalis pedis pulses palpable bilaterally.  The extensive superficial reticular pattern on the right medial thigh area is unchanged, and the prominent bulges in the left leg at the distal thigh and calf are unchanged.  He has no distal edema.  He does have gross reflux in both great saphenous systems from the knee to the saphenofemoral junction.  I think the best plan would be:  1. Laser ablation of the left great saphenous vein.  2.  Laser ablation of the right great saphenous vein number.  3.  Two courses of sclerotherapy on the right leg.  We will proceed with precertification to perform this in the near future to this nice gentleman.    Quita Skye Hart [place], M.D. Electronically Signed  JDL/MEDQ  D:  05/31/2010  T:  06/01/2010  Job:  [id number]

## 2010-06-04 NOTE — Op Note (Signed)
NAMENOVA, [name] [name]                          ACCOUNT NO.:  [id number]   MEDICAL RECORD NO.:  [id number]                   PATIENT TYPE:  AMB   LOCATION:  DAY                                  FACILITY:  [facility]   PHYSICIAN:  Timothy E. Earlene Plater, M.D.              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   OPERATION/PROCEDURE:  Rectal ultrasound and transanal excision of tumor.   SURGEON:  Timothy E. Earlene Plater, M.D.   ANESTHESIA:  General.   INDICATIONS:  Mr. Decesare is a perfectly healthy 70 year old male.  His only  medical diagnosis history of colon polyps.  Recently because of bright red  bleeding, he was seen by Dr. Cheryln Manly and colonoscopy carried out.  A  villous adenoma was found in the rectum.  Fragments were removed and  pathological report showed high-grade dysplasia and focal adenocarcinoma.  The CT scan has been done and is negative.  His laboratory data are all  normal.  He has been carefully counseled in the office and wishes to proceed  with this procedure.   The patient was seen and evaluated by anesthesia, identified and permit  signed.   DESCRIPTION OF PROCEDURE:  He was taken to the operating room, placed  supine, general endotracheal anesthesia was administered and he was  carefully placed in the lithotomy position.  The table was adjusted.  The  perianal area was prepped.  The rectal ultrasound probe was introduced with  the balloon and various techniques and distances and volumes of bulb fluid  were used and various pictures made.  This is a T1, N0 lesion, being within  the mucosa and submucosa.  Copies are included in the chart.  The ultrasound  probe was removed.  The perianal layer was then again  prepped and draped in  the usual fashion.  I used 4 mL of Marcaine, epinephrine around the  anorectum, massaging that in, gently dilated the anorectum to accept the  operating anoscope.   The tumor was located in the left anterior position at 6 cm and was clearly  visible.  This area of the rectal mucosa was prepped with Betadine and  irrigated.  Stay sutures of 0 Vicryl were placed proximal and distal to the  tumor and then the mucosa around the tumor was marked with the cautery.  I  tried to allow a total of 1 cm of normal mucosa from the edge of the tumor.  This included the cauterized rim.  This was carried out until we reached the  perirectal fat and then beneath the tumor, I used sharp dissection, not  cautery, to completely remove the disc of tissue including the  tumor.  This  was removed from the rectum, taken out, put on a cork board and carefully  marked for pathology.   We then returned our attention to the rectal area.  Bleeding was minor.  A  few areas were cauterized and then the defect was closed with interrupted 2-  0 and 0 Vicryl sutures.  This was accomplished without difficulty or  complications and bleeding was minimal.  The wound was inspected on three  different occasions to be sure there was no bleeding and none was found.  Irrigation was clear.  A soft pack of Gelfoam wrapped with Surgicel was  placed in the rectal vault.  The patient tolerated it well and was awakened,  extubated and taken to the recovery room in good condition.  Ultrasound  pictures are on the chart.  The pathology will be pending and the patient  will be followed closely next week with the final pathology report.                                               Timothy E. Earlene Plater, M.D.    TED/MEDQ  D:  05/02/2003  T:  05/02/2003  Job:  [id number]   cc:   Vania Rea. Jarold Motto, M.D. [facility]

## 2010-07-01 ENCOUNTER — Encounter: Payer: Self-pay | Admitting: *Deleted

## 2010-07-05 ENCOUNTER — Other Ambulatory Visit (INDEPENDENT_AMBULATORY_CARE_PROVIDER_SITE_OTHER): Payer: Medicare Other | Admitting: Vascular Surgery

## 2010-07-05 DIAGNOSIS — I83893 Varicose veins of bilateral lower extremities with other complications: Secondary | ICD-10-CM

## 2010-07-05 NOTE — Assessment & Plan Note (Signed)
OFFICE VISIT  [name] [name], [name] [name] DOB:  02-18-1940                                       07/05/2010 [id number]#:[id number]  This is an office visit.  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  He tolerated the procedure well.  It was done under local tumescent anesthesia.  He will return on June 25 for venous duplex exam to confirm closure of the left great saphenous vein.    Quita Skye Hart Rochester, M.D. Electronically Signed  JDL/MEDQ  D:  07/05/2010  T:  07/05/2010  Job:  [id number]

## 2010-07-12 ENCOUNTER — Encounter (INDEPENDENT_AMBULATORY_CARE_PROVIDER_SITE_OTHER): Payer: Medicare Other

## 2010-07-12 ENCOUNTER — Ambulatory Visit (INDEPENDENT_AMBULATORY_CARE_PROVIDER_SITE_OTHER): Payer: Medicare Other | Admitting: Vascular Surgery

## 2010-07-12 DIAGNOSIS — M79609 Pain in unspecified limb: Secondary | ICD-10-CM

## 2010-07-12 DIAGNOSIS — I83893 Varicose veins of bilateral lower extremities with other complications: Secondary | ICD-10-CM

## 2010-07-12 NOTE — Assessment & Plan Note (Signed)
OFFICE VISIT  [name] [name], [name] [name] DOB:  06-18-1940                                       07/12/2010 [id number]#:[id number]  The patient had laser ablation of the left great saphenous vein for venous hypertension and painful varicosities performed 1 week ago.  He tolerated the procedure well and returns today with some mild to moderate discomfort along the course of the great saphenous vein from the mid thigh to the saphenofemoral junction.  He has had no distal edema and has had no change in the symptomatology in the left leg.  He has been wearing his long-leg elastic compression stockings and taking ibuprofen as instructed and elevating the legs when not ambulating.  He continues to have aching and throbbing discomfort in the contralateral right leg secondary to a similar problem.  PHYSICAL EXAMINATION:  On examination today blood pressure 152/94, heart rate 63, respirations 24.  There is mild tenderness along the course of the great saphenous vein with no erythema or evidence of distal thrombophlebitis.  He has 3+ dorsalis pedis pulse in the left leg with no ankle edema.  Venous duplex exam was ordered by me today, reviewed and interpreted.  There is no DVT and the great saphenous vein in the left leg is totally occluded from the saphenofemoral junction to the distal thigh.  I reassured him regarding these findings.  He will return on July 9 for similar procedure on the contralateral right leg.    Quita Skye Hart Rochester, M.D. Electronically Signed  JDL/MEDQ  D:  07/12/2010  T:  07/12/2010  Job:  [id number]

## 2010-07-22 NOTE — Procedures (Unsigned)
DUPLEX DEEP VENOUS EXAM - LOWER EXTREMITY  INDICATION:  Followup varicose veins, pain of the left lower extremity.  HISTORY:  Edema:  Yes. Trauma/Surgery:  Left lower extremity endovenous laser ablation 07/05/2010. Pain:  Yes. PE:  No. Previous DVT:  No. Anticoagulants:  No. Other:  DUPLEX EXAM:               CFV   SFV   PopV  PTV    GSV               R  L  R  L  R  L  R   L  R  L Thrombosis    o  o     o     o      o     + Spontaneous   +  +     +     +      +     o Phasic        +  +     +     +      +     o Augmentation  +  +     +     +      +     o Compressible  +  +     +     +      +     o Competent     +  +     o     +      +  Legend:  + - yes  o - no  p - partial  D - decreased   IMPRESSION: 1. No evidence of deep vein thrombosis of the left lower extremity is     identified. 2. Good post ablation results the length of the left greater saphenous     vein ablated. 3. An incompetent perforator is noted in the mid calf measuring 0.24     cm in diameter. 4. Contralateral common femoral vein appears patent with normal,     spontaneous, and phasic flow.        _____________________________ Quita Skye Hart Rochester, M.D.  SH/MEDQ  D:  07/12/2010  T:  07/12/2010  Job:  [id number]

## 2010-07-26 ENCOUNTER — Other Ambulatory Visit: Payer: Medicare Other | Admitting: Vascular Surgery

## 2010-08-02 ENCOUNTER — Ambulatory Visit: Payer: Medicare Other | Admitting: Vascular Surgery

## 2010-08-04 ENCOUNTER — Encounter: Payer: Self-pay | Admitting: Gastroenterology

## 2010-08-30 ENCOUNTER — Encounter: Payer: Self-pay | Admitting: Vascular Surgery

## 2010-09-02 ENCOUNTER — Encounter: Payer: Self-pay | Admitting: Vascular Surgery

## 2010-09-24 ENCOUNTER — Encounter: Payer: Self-pay | Admitting: Vascular Surgery

## 2010-09-27 ENCOUNTER — Ambulatory Visit (INDEPENDENT_AMBULATORY_CARE_PROVIDER_SITE_OTHER): Payer: Medicare Other | Admitting: Vascular Surgery

## 2010-09-27 ENCOUNTER — Encounter: Payer: Self-pay | Admitting: Vascular Surgery

## 2010-09-27 ENCOUNTER — Other Ambulatory Visit: Payer: Medicare Other | Admitting: Vascular Surgery

## 2010-09-27 VITALS — BP 142/71 | HR 60 | Resp 20 | Ht 70.0 in | Wt 185.0 lb

## 2010-09-27 DIAGNOSIS — I83893 Varicose veins of bilateral lower extremities with other complications: Secondary | ICD-10-CM

## 2010-09-27 NOTE — Progress Notes (Signed)
Subjective:     Patient ID: [name] [name], male   DOB: 1940-10-11, 70 y.o.   MRN: [id number]  HPI  Laser Ablation Procedure      Date: 09/27/2010    [name] [name] DOB:1940-10-04  Consent signed: yes}  Surgeon:James D. Hart Rochester  Procedure: Laser Ablation: Right Great Saphenous Vein  BP 142/71  Pulse 60  Resp 20  Ht 5\' 10"  (1.778 m)  Wt 185 lb (83.915 kg)  BMI 26.54 kg/m2  Start time: 3:00   End time:4:10  Tumescent Anesthesia: 400 cc 0.9% NaCl with 50 cc Lidocaine HCL with 1% Epi and 15 cc 8.4% NaHCO3  Local Anesthesia: 6 cc Lidocaine HCL and NaHCO3 (ratio 2:1)  Pulsed mode: 15 watts, 1 sec, 1 pulse   Sclerotherapy: .3% %Sotradecol. Patient received a total of 6 cc foam    Patient tolerated procedure well: yes}    Description of Procedure:  After marking the course of the saphenous vein and the secondary varicosities in the standing position, the patient was placed on the operating table in the supine position, and  The right leg was prepped and draped in sterile fashion. Local anesthetic was administered, and under ultrasound guidance the saphenous vein was accessed with a micro needle and guide wire; then the micro puncture sheath was placed. A guide wire was inserted to the saphenofemoral junction, followed by a 5 french sheath.  The position of the sheath and then the laser fiber below the junction was confirmed using the ultrasound and visualization of the aiming beam.  Tumescent anesthesia was administered along the course of the saphenous vein using ultrasound guidance. Protective laser glasses were placed on the patient, and the laser was fired at 15 watts.  for a total of 2476 joules.  A steri strip was applied to the puncture site.  The patient was then put into Trendelenburg position.  Sclerotherapy was performed to several perforator vessels using 6  cc .3% Sotradecol foam via a 27g butterfly needle.  ABD pads and thigh high compression stockings were applied as  well as ace wraps.  Blood loss was less than 15 cc.  The patient ambulated out of the operating room having tolerated the procedure well.   Review of Systems     Objective:   Physical Exam     Assessment:         Plan:

## 2010-09-27 NOTE — Progress Notes (Signed)
Subjective:     Patient ID: [name] [name], male   DOB: 05-21-1940, 70 y.o.   MRN: [id number]  HPI this a 70 year old male patient had laser ablation right great saphenous vein from the proximal calf to the saphenofemoral junction performed under local tumescent anesthesia. He tolerated the procedure well. This was done for venous insufficiency due to valvular incompetence and reflux in the right great saphenous system.   Review of Systems     Objective:   Physical Exam Blood pressure 142/71 heart rate 60 respirations 20    Assessment:     Well tolerated laser ablation right great saphenous vein for venous insufficiency and valvular incompetence    Plan:     Return in one week for venous duplex exam of right leg to confirm closure of right great saphenous vein

## 2010-09-28 ENCOUNTER — Telehealth: Payer: Self-pay | Admitting: *Deleted

## 2010-09-28 NOTE — Telephone Encounter (Signed)
Post op call made the morning after his laser ablation of his Right GSV and sclero to right thigh.

## 2010-10-04 ENCOUNTER — Encounter: Payer: Self-pay | Admitting: Vascular Surgery

## 2010-10-05 ENCOUNTER — Encounter (INDEPENDENT_AMBULATORY_CARE_PROVIDER_SITE_OTHER): Payer: Medicare Other | Admitting: *Deleted

## 2010-10-05 ENCOUNTER — Ambulatory Visit (INDEPENDENT_AMBULATORY_CARE_PROVIDER_SITE_OTHER): Payer: Medicare Other | Admitting: Vascular Surgery

## 2010-10-05 ENCOUNTER — Ambulatory Visit: Payer: Medicare Other | Admitting: Vascular Surgery

## 2010-10-05 VITALS — BP 181/81 | HR 60 | Resp 20 | Ht 70.0 in | Wt 185.0 lb

## 2010-10-05 DIAGNOSIS — I8310 Varicose veins of unspecified lower extremity with inflammation: Secondary | ICD-10-CM

## 2010-10-05 DIAGNOSIS — I83893 Varicose veins of bilateral lower extremities with other complications: Secondary | ICD-10-CM

## 2010-10-05 NOTE — Progress Notes (Signed)
One week fu laser ablation and sclero.

## 2010-10-05 NOTE — Progress Notes (Signed)
Subjective:     Patient ID: [name] [name], male   DOB: Mar 20, 1940, 70 y.o.   MRN: [id number]  HPI this 70 year old male patient returns 1 week post laser ablation of right great saphenous vein for venous hypertension secondary to valvular reflux in the right great saphenous system. He has worn elastic compression stockings and taking ibuprofen as instructed. He has had no distal edema and has had only mild discomfort in the right thigh area. He continues to get along well in regards to his left leg which was done in June of this year.  No past medical history on file.  History  Substance Use Topics  . Smoking status: Not on file  . Smokeless tobacco: Not on file  . Alcohol Use: Not on file    No family history on file.  Allergies  Allergen Reactions  . Niacin     REACTION: hives    No current outpatient prescriptions on file.  BP 181/81  Pulse 60  Resp 20  Ht 5\' 10"  (1.778 m)  Wt 185 lb (83.915 kg)  BMI 26.54 kg/m2  Body mass index is 26.54 kg/(m^2).         Review of Systems denies chest pain dyspnea on exertion PND orthopnea or claudication symptoms    Objective:   Physical Exam blood pressure 181/81 heart rate 60 respirations 20 General he is well-developed well-nourished male no apparent stress Lower skin exam reveals 3+ femoral dorsalis pedis pulses bilaterally. There is mild discomfort along the course of the right great saphenous vein. There is no erythema or blistering. No distal edema is noted. There is no active ulceration next  Today I ordered a venous duplex exam of the right leg which are viewed and interpreted. There is no DVT. The greater saphenous vein is totally occluded from the distal insertion site to the saphenofemoral junction     Assessment:    doing well post laser ablation right great saphenous vein    Plan:     #1 lasted compression stockings for one more week #2 resume normal activities in one week #3 return to see [place] on when necessary  basis

## 2010-10-13 NOTE — Procedures (Unsigned)
DUPLEX DEEP VENOUS EXAM - LOWER EXTREMITY  INDICATION:  Right GSV ablation.  HISTORY:  Edema:  No. Trauma/Surgery:  Right GSV ablation 09/27/2010. Pain:  Tenderness. PE:  No. Previous DVT:  No. Anticoagulants:  No. Other:  DUPLEX EXAM:               CFV   SFV   PopV  PTV    GSV               R  L  R  L  R  L  R   L  R  L Thrombosis    o  o  o     o     o      + Spontaneous   +  +  +     +     +      o Phasic        +  +  +     +     +      o Augmentation  +  +  +     +     +      o Compressible  +  +  +     +     +      o Competent     o  +  o     +     +      +  Legend:  + - yes  o - no  p - partial  D - decreased  IMPRESSION: 1. No evidence of right lower extremity deep venous thrombosis was     noted. 2. The great saphenous vein appears thrombosed from the distal     insertion site to the saphenofemoral junction.   _____________________________ Quita Skye Hart Rochester, M.D.  EM/MEDQ  D:  10/05/2010  T:  10/05/2010  Job:  [id number]

## 2010-12-18 ENCOUNTER — Encounter: Payer: Self-pay | Admitting: Internal Medicine

## 2010-12-18 DIAGNOSIS — Z0001 Encounter for general adult medical examination with abnormal findings: Secondary | ICD-10-CM | POA: Insufficient documentation

## 2010-12-18 DIAGNOSIS — Z0000 Encounter for general adult medical examination without abnormal findings: Secondary | ICD-10-CM | POA: Insufficient documentation

## 2010-12-23 ENCOUNTER — Other Ambulatory Visit (INDEPENDENT_AMBULATORY_CARE_PROVIDER_SITE_OTHER): Payer: Medicare Other

## 2010-12-23 ENCOUNTER — Ambulatory Visit (INDEPENDENT_AMBULATORY_CARE_PROVIDER_SITE_OTHER): Payer: Medicare Other | Admitting: Internal Medicine

## 2010-12-23 ENCOUNTER — Encounter: Payer: Self-pay | Admitting: Internal Medicine

## 2010-12-23 ENCOUNTER — Other Ambulatory Visit: Payer: Self-pay | Admitting: Internal Medicine

## 2010-12-23 VITALS — BP 132/70 | HR 51 | Temp 97.4°F | Ht 70.0 in | Wt 188.0 lb

## 2010-12-23 DIAGNOSIS — Z23 Encounter for immunization: Secondary | ICD-10-CM

## 2010-12-23 DIAGNOSIS — Z0000 Encounter for general adult medical examination without abnormal findings: Secondary | ICD-10-CM

## 2010-12-23 DIAGNOSIS — Z125 Encounter for screening for malignant neoplasm of prostate: Secondary | ICD-10-CM

## 2010-12-23 DIAGNOSIS — Z79899 Other long term (current) drug therapy: Secondary | ICD-10-CM

## 2010-12-23 LAB — HEPATIC FUNCTION PANEL
ALT: 22 U/L (ref 0–53)
AST: 29 U/L (ref 0–37)
Albumin: 4.1 g/dL (ref 3.5–5.2)
Alkaline Phosphatase: 57 U/L (ref 39–117)
Bilirubin, Direct: 0.1 mg/dL (ref 0.0–0.3)
Total Bilirubin: 0.7 mg/dL (ref 0.3–1.2)
Total Protein: 6.8 g/dL (ref 6.0–8.3)

## 2010-12-23 LAB — CBC WITH DIFFERENTIAL/PLATELET
Basophils Absolute: 0 10*3/uL (ref 0.0–0.1)
Basophils Relative: 0.2 % (ref 0.0–3.0)
Eosinophils Absolute: 0.1 10*3/uL (ref 0.0–0.7)
Eosinophils Relative: 2.4 % (ref 0.0–5.0)
HCT: 47.9 % (ref 39.0–52.0)
Hemoglobin: 16.5 g/dL (ref 13.0–17.0)
Lymphocytes Relative: 13.6 % (ref 12.0–46.0)
Lymphs Abs: 0.8 10*3/uL (ref 0.7–4.0)
MCHC: 34.5 g/dL (ref 30.0–36.0)
MCV: 97.5 fl (ref 78.0–100.0)
Monocytes Absolute: 0.7 10*3/uL (ref 0.1–1.0)
Monocytes Relative: 12.3 % — ABNORMAL HIGH (ref 3.0–12.0)
Neutro Abs: 4.2 10*3/uL (ref 1.4–7.7)
Neutrophils Relative %: 71.5 % (ref 43.0–77.0)
Platelets: 234 10*3/uL (ref 150.0–400.0)
RBC: 4.91 Mil/uL (ref 4.22–5.81)
RDW: 12.4 % (ref 11.5–14.6)
WBC: 5.9 10*3/uL (ref 4.5–10.5)

## 2010-12-23 LAB — URINALYSIS, ROUTINE W REFLEX MICROSCOPIC
Bilirubin Urine: NEGATIVE
Hgb urine dipstick: NEGATIVE
Ketones, ur: NEGATIVE
Leukocytes, UA: NEGATIVE
Nitrite: NEGATIVE
Specific Gravity, Urine: <=1.005 (ref 1.000–1.030)
Total Protein, Urine: NEGATIVE
Urine Glucose: NEGATIVE
Urobilinogen, UA: 0.2 (ref 0.0–1.0)
pH: 6.5 (ref 5.0–8.0)

## 2010-12-23 LAB — LIPID PANEL
Cholesterol: 242 mg/dL — ABNORMAL HIGH (ref 0–200)
HDL: 45.1 mg/dL (ref >39.00)
Total CHOL/HDL Ratio: 5
Triglycerides: 72 mg/dL (ref 0.0–149.0)
VLDL: 14.4 mg/dL (ref 0.0–40.0)

## 2010-12-23 LAB — TSH: TSH: 1.99 u[IU]/mL (ref 0.35–5.50)

## 2010-12-23 LAB — BASIC METABOLIC PANEL
BUN: 11 mg/dL (ref 6–23)
CO2: 30 mEq/L (ref 19–32)
Calcium: 8.9 mg/dL (ref 8.4–10.5)
Chloride: 101 mEq/L (ref 96–112)
Creatinine, Ser: 1.2 mg/dL (ref 0.4–1.5)
GFR: 65.47 mL/min (ref >60.00)
Glucose, Bld: 91 mg/dL (ref 70–99)
Potassium: 4.6 mEq/L (ref 3.5–5.1)
Sodium: 137 mEq/L (ref 135–145)

## 2010-12-23 LAB — PSA: PSA: 8.61 ng/mL — ABNORMAL HIGH (ref 0.10–4.00)

## 2010-12-23 LAB — LDL CHOLESTEROL, DIRECT: Direct LDL: 179.4 mg/dL

## 2010-12-23 MED ORDER — ATORVASTATIN CALCIUM 20 MG PO TABS: 20.0000 mg | ORAL_TABLET | Freq: Every day | ORAL | Status: DC

## 2010-12-23 NOTE — Patient Instructions (Signed)
You had the flu shot today Continue all other medications as before Please go to LAB in the Basement for the blood and/or urine tests to be done today Please call the phone number [phone] (the PhoneTree System) for results of testing in 2-3 days;  When calling, simply dial the number, and when prompted enter the MRN number above (the Medical Record Number) and the # key, then the message should start. Please return in 1 year for your yearly visit, or sooner if needed, with Lab testing done 3-5 days before

## 2010-12-23 NOTE — Assessment & Plan Note (Addendum)
Overall doing well, age appropriate education and counseling updated, referrals for preventative services and immunizations addressed, dietary and smoking counseling addressed, most recent labs and ECG reviewed.  I have personally reviewed and have noted: 1) the patient's medical and social history 2) The pt's use of alcohol, tobacco, and illicit drugs 3) The patient's current medications and supplements 4) Functional ability including ADL's, fall risk, home safety risk, hearing and visual impairment 5) Diet and physical activities 6) Evidence for depression or mood disorder 7) The patient's height, weight, and BMI have been recorded in the chart I have made referrals, and provided counseling and education based on review of the above ECG reviewed as per emr pneumovasx declined today

## 2010-12-23 NOTE — Progress Notes (Signed)
Subjective:    Patient ID: [name] [name], male    DOB: 05-27-1940, 70 y.o.   MRN: [id number]  HPI  Here for wellness and f/u;  Overall doing ok;  Pt denies CP, worsening SOB, DOE, wheezing, orthopnea, PND, worsening LE edema, palpitations, dizziness or syncope.  Pt denies neurological change such as new Headache, facial or extremity weakness.  Pt denies polydipsia, polyuria, or low sugar symptoms. Pt states overall good compliance with treatment and medications, good tolerability, and trying to follow lower cholesterol diet.  Pt denies worsening depressive symptoms, suicidal ideation or panic. No fever, wt loss, night sweats, loss of appetite, or other constitutional symptoms.  Pt states good ability with ADL's, low fall risk, home safety reviewed and adequate, no significant changes in hearing or vision, and occasionally active with exercise.  No acute complaints Past Medical History  Diagnosis Date  . 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 TIA 12/03/2007  . CEREBROVASCULAR ACCIDENT, HX OF 09/20/2007  . HYPERLIPIDEMIA 12/18/2006  . PERIPHERAL VASCULAR DISEASE 09/20/2007  . BRADYCARDIA 09/20/2007  . DIVERTICULOSIS, COLON 09/20/2007  . COLONIC POLYPS, HX OF 12/18/2006   No past surgical history on file.  reports that he has never smoked. He does not have any smokeless tobacco history on file. He reports that he does not drink alcohol or use illicit drugs. family history is not on file. Allergies  Allergen Reactions  . Niacin     REACTION: hives  . Simvastatin Other (See Comments)    myalgias   No current outpatient prescriptions on file prior to visit.   Review of Systems Review of Systems  Constitutional: Negative for diaphoresis, activity change, appetite change and unexpected weight change.  HENT: Negative for hearing loss, ear pain, facial swelling, mouth sores and neck stiffness.   Eyes: Negative for pain, redness and visual disturbance.  Respiratory: Negative for shortness of breath and  wheezing.   Cardiovascular: Negative for chest pain and palpitations.  Gastrointestinal: Negative for diarrhea, blood in stool, abdominal distention and rectal pain.  Genitourinary: Negative for hematuria, flank pain and decreased urine volume.  Musculoskeletal: Negative for myalgias and joint swelling.  Skin: Negative for color change and wound.  Neurological: Negative for syncope and numbness.  Hematological: Negative for adenopathy.  Psychiatric/Behavioral: Negative for hallucinations, self-injury, decreased concentration and agitation.      Objective:   Physical Exam BP 132/70  Pulse 51  Temp(Src) 97.4 F (36.3 C) (Oral)  Ht 5\' 10"  (1.778 m)  Wt 188 lb (85.276 kg)  BMI 26.98 kg/m2  SpO2 97% Physical Exam  VS noted Constitutional: Pt is oriented to person, place, and time. Appears well-developed and well-nourished.  HENT:  Head: Normocephalic and atraumatic.  Right Ear: External ear normal.  Left Ear: External ear normal.  Nose: Nose normal.  Mouth/Throat: Oropharynx is clear and moist.  Eyes: Conjunctivae and EOM are normal. Pupils are equal, round, and reactive to light.  Neck: Normal range of motion. Neck supple. No JVD present. No tracheal deviation present.  Cardiovascular: Normal rate, regular rhythm, normal heart sounds and intact distal pulses.   Pulmonary/Chest: Effort normal and breath sounds normal.  Abdominal: Soft. Bowel sounds are normal. There is no tenderness.  Musculoskeletal: Normal range of motion. Exhibits no edema.  Lymphadenopathy:  Has no cervical adenopathy.  Neurological: Pt is alert and oriented to person, place, and time. Pt has normal reflexes. No cranial nerve deficit.  Skin: Skin is warm and dry. No rash noted.  Psychiatric:  Has  normal mood  and affect. Behavior is normal.     Assessment & Plan:

## 2010-12-24 ENCOUNTER — Encounter: Payer: Self-pay | Admitting: Vascular Surgery

## 2010-12-26 ENCOUNTER — Encounter: Payer: Self-pay | Admitting: Internal Medicine

## 2010-12-27 ENCOUNTER — Encounter: Payer: Self-pay | Admitting: Vascular Surgery

## 2012-02-03 ENCOUNTER — Ambulatory Visit (INDEPENDENT_AMBULATORY_CARE_PROVIDER_SITE_OTHER): Payer: Medicare Other | Admitting: Internal Medicine

## 2012-02-03 ENCOUNTER — Other Ambulatory Visit (INDEPENDENT_AMBULATORY_CARE_PROVIDER_SITE_OTHER): Payer: Medicare Other

## 2012-02-03 ENCOUNTER — Encounter: Payer: Self-pay | Admitting: Internal Medicine

## 2012-02-03 ENCOUNTER — Ambulatory Visit (INDEPENDENT_AMBULATORY_CARE_PROVIDER_SITE_OTHER)
Admission: RE | Admit: 2012-02-03 | Discharge: 2012-02-03 | Disposition: A | Discharge status: Home / Self Care | Payer: Medicare Other | Source: Ambulatory Visit | Attending: Internal Medicine | Admitting: Internal Medicine

## 2012-02-03 VITALS — BP 160/82 | HR 52 | Wt 192.0 lb

## 2012-02-03 DIAGNOSIS — Z0000 Encounter for general adult medical examination without abnormal findings: Secondary | ICD-10-CM

## 2012-02-03 DIAGNOSIS — R030 Elevated blood-pressure reading, without diagnosis of hypertension: Secondary | ICD-10-CM | POA: Insufficient documentation

## 2012-02-03 DIAGNOSIS — E785 Hyperlipidemia, unspecified: Secondary | ICD-10-CM

## 2012-02-03 DIAGNOSIS — R0602 Shortness of breath: Secondary | ICD-10-CM

## 2012-02-03 DIAGNOSIS — R079 Chest pain, unspecified: Secondary | ICD-10-CM | POA: Insufficient documentation

## 2012-02-03 LAB — URINALYSIS, ROUTINE W REFLEX MICROSCOPIC
Bilirubin Urine: NEGATIVE
Hgb urine dipstick: NEGATIVE
Leukocytes, UA: NEGATIVE
Nitrite: NEGATIVE
Total Protein, Urine: NEGATIVE
Urobilinogen, UA: 0.2 (ref 0.0–1.0)

## 2012-02-03 LAB — CBC WITH DIFFERENTIAL/PLATELET
Basophils Absolute: 0 10*3/uL (ref 0.0–0.1)
Eosinophils Absolute: 0.1 10*3/uL (ref 0.0–0.7)
Lymphocytes Relative: 12.3 % (ref 12.0–46.0)
MCHC: 33.4 g/dL (ref 30.0–36.0)
MCV: 97.8 fl (ref 78.0–100.0)
Monocytes Absolute: 0.8 10*3/uL (ref 0.1–1.0)
Neutrophils Relative %: 77 % (ref 43.0–77.0)
Platelets: 227 10*3/uL (ref 150.0–400.0)
RDW: 12.5 % (ref 11.5–14.6)

## 2012-02-03 LAB — HEPATIC FUNCTION PANEL
ALT: 29 U/L (ref 0–53)
AST: 30 U/L (ref 0–37)
Bilirubin, Direct: 0.1 mg/dL (ref 0.0–0.3)
Total Bilirubin: 0.7 mg/dL (ref 0.3–1.2)
Total Protein: 7.3 g/dL (ref 6.0–8.3)

## 2012-02-03 LAB — LIPID PANEL
Cholesterol: 189 mg/dL (ref 0–200)
LDL Cholesterol: 123 mg/dL — ABNORMAL HIGH (ref 0–99)
Total CHOL/HDL Ratio: 4
Triglycerides: 97 mg/dL (ref 0.0–149.0)

## 2012-02-03 LAB — BASIC METABOLIC PANEL
CO2: 33 mEq/L — ABNORMAL HIGH (ref 19–32)
Chloride: 98 mEq/L (ref 96–112)
Creatinine, Ser: 1 mg/dL (ref 0.4–1.5)
Potassium: 4.3 mEq/L (ref 3.5–5.1)

## 2012-02-03 LAB — TSH: TSH: 1.8 u[IU]/mL (ref 0.35–5.50)

## 2012-02-03 MED ORDER — ASPIRIN 81 MG PO TBEC: 81.0000 mg | DELAYED_RELEASE_TABLET | Freq: Every day | ORAL | Status: DC

## 2012-02-03 NOTE — Patient Instructions (Addendum)
Take the 81 mg aspirin every day Please continue all other medications as before, and refills have been done if requested. Please go to the XRAY Department in the Basement (go straight as you get off the elevator) for the x-ray testing Please go to the LAB in the Basement (turn left off the elevator) for the tests to be done today You will be contacted by phone if any changes need to be made immediately.  Otherwise, you will receive a letter about your results with an explanation Please do not do heavier exertion over the weekend We will try to call cardiology, and you should expect a call about the referral very soon

## 2012-02-03 NOTE — Assessment & Plan Note (Signed)
Not clear from hx that pt has essential HTN, cont to follow

## 2012-02-03 NOTE — Assessment & Plan Note (Addendum)
ECG reviewed as per emr, o/w see CP discussion

## 2012-02-03 NOTE — Assessment & Plan Note (Signed)

## 2012-02-03 NOTE — Assessment & Plan Note (Signed)
ECG reviewed as per emr, By hx c/w typical new exertional angina, no CP now, to start ASA 81 mg regular daily use, advised to avoid any exertion that would tend to cause it to recur, will hold on beta blocker for now or other anti-anginal, to cont statin regular daily use, have d/w Dr Antoine Poche oncall tonight/[place] cardiology who agrees pt needs OV with cardiology, possibly Monday jan 20 and will arrange;  I think pt may be candidate for direct scheduling for cath

## 2012-02-03 NOTE — Progress Notes (Signed)
Subjective:    Patient ID: [name] [name], male    DOB: 03/15/1940, 72 y.o.   MRN: [id number]  HPI  Here for wellness and f/u; pt considers himself an active retiree, and Pt denies wheezing, orthopnea, PND, worsening LE edema, palpitations, dizziness or syncope, but has noticed unusual DOE with mowing the grass last fall without pain and was able to finish, and has been less active with his usual walks in the colder weather of the past 2-3 mo, but has noticed with a few cold weather walks the same DOEassoc with burning SSCP (places right hand to center mid chest to demonstrate) that improves with rest.  Became concerned as is reproducible.   No radiation, diaphoresis, n/v, palp's.  Has occas reflux symptoms but the exertional discomfort seems different to him.  Takes ASA occasionally, and may have missed a few lipitor doses.  Does check BP occasionally at drug store and states SBP in the 120's and 130's.  Only seems high in the office like today    Pt denies neurological change such as new headache, facial or extremity weakness.  Pt denies polydipsia, polyuria, or low sugar symptoms. Pt states overall good compliance with treatment and medications, good tolerability, and has been trying to follow lower cholesterol diet.  Pt denies worsening depressive symptoms, suicidal ideation or panic. No fever, night sweats, wt loss, loss of appetite, or other constitutional symptoms.  Pt has good ability with ADL's, has low fall risk, home safety reviewed and adequate, no other significant changes in hearing or vision. Past Medical History  Diagnosis Date  . 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 TIA 12/03/2007  . CEREBROVASCULAR ACCIDENT, HX OF 09/20/2007  . HYPERLIPIDEMIA 12/18/2006  . PERIPHERAL VASCULAR DISEASE 09/20/2007  . BRADYCARDIA 09/20/2007  . DIVERTICULOSIS, COLON 09/20/2007  . COLONIC POLYPS, HX OF 12/18/2006   No past surgical history on file.  reports that he has never smoked. He has never used smokeless  tobacco. He reports that he does not drink alcohol or use illicit drugs. family history includes Cancer in his brother and Heart disease in his father. Allergies  Allergen Reactions  . Niacin     REACTION: hives  . Simvastatin Other (See Comments)    myalgias   Current Outpatient Prescriptions on File Prior to Visit  Medication Sig Dispense Refill  . atorvastatin (LIPITOR) 20 MG tablet Take 20 mg by mouth daily.       Review of Systems Constitutional: Negative for appetite change or unexpected weight change.  HENT: Negative for hearing loss, ear pain, facial swelling, mouth sores and neck stiffness.   Eyes: Negative for pain, redness and visual disturbance.  Respiratory: Negative for wheezing.   Cardiovascular: Negative for palpitations.  Gastrointestinal: Negative for diarrhea, blood in stool, abdominal distention or other pain Genitourinary: Negative for hematuria, flank pain or change in urine volume.  Musculoskeletal: Negative for myalgias and joint swelling.  Skin: Negative for color change and wound.  Neurological: Negative for syncope and numbness. other than noted Hematological: Negative for adenopathy.  Psychiatric/Behavioral: Negative for hallucinations, self-injury, decreased concentration and agitation.     Objective:   Physical Exam BP 160/82  Pulse 52  Wt 192 lb (87.091 kg) VS noted,  Constitutional: Pt is oriented to person, place, and time. Appears well-developed and well-nourished.  Head: Normocephalic and atraumatic.  Right Ear: External ear normal.  Left Ear: External ear normal.  Nose: Nose normal.  Mouth/Throat: Oropharynx is clear and moist.  Eyes: Conjunctivae and EOM are normal.  Pupils are equal, round, and reactive to light.  Neck: Normal range of motion. Neck supple. No JVD present. No tracheal deviation present.  Cardiovascular: Normal rate, regular rhythm, normal heart sounds and intact distal pulses.   Pulmonary/Chest: Effort normal and breath  sounds normal.  Abdominal: Soft. Bowel sounds are normal. There is no tenderness. No HSM  Musculoskeletal: Normal range of motion. Exhibits no edema.  Lymphadenopathy:  Has no cervical adenopathy.  Neurological: Pt is alert and oriented to person, place, and time. Pt has normal reflexes. No cranial nerve deficit.  Skin: Skin is warm and dry. No rash noted.  Psychiatric:  Has  normal mood and affect. Behavior is normal.     Assessment & Plan:

## 2012-02-06 ENCOUNTER — Ambulatory Visit (INDEPENDENT_AMBULATORY_CARE_PROVIDER_SITE_OTHER): Payer: Medicare Other | Admitting: Cardiology

## 2012-02-06 ENCOUNTER — Other Ambulatory Visit: Payer: Self-pay

## 2012-02-06 ENCOUNTER — Encounter: Payer: Self-pay | Admitting: Cardiology

## 2012-02-06 VITALS — BP 178/81 | HR 61 | Ht 70.0 in | Wt 190.0 lb

## 2012-02-06 DIAGNOSIS — C61 Malignant neoplasm of prostate: Secondary | ICD-10-CM

## 2012-02-06 DIAGNOSIS — E785 Hyperlipidemia, unspecified: Secondary | ICD-10-CM

## 2012-02-06 DIAGNOSIS — R0602 Shortness of breath: Secondary | ICD-10-CM

## 2012-02-06 MED ORDER — ATORVASTATIN CALCIUM 20 MG PO TABS: 20.0000 mg | ORAL_TABLET | Freq: Every day | ORAL | Status: DC

## 2012-02-06 NOTE — Progress Notes (Addendum)
   HPI:  This very nice gentleman is referred through the courtesy of Dr. Jonny Ruiz. 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 He has noticed since the early fall some shortness of breath with exertion. It seems to be more prominent when it is warm and cold when he is walking  he will notice a little bit of burning in the chest. He says now he stops walking in cold weather. He did walk 2 flights of stairs up yesterday, and did not have a problem. He denies any rest symptoms. There is no significant radiation of the complaints. His blood pressure is elevated in the office today, but he says this is unusual, and is normal.  The patient has no prior cardiac history, although his father did die of a myocardial infarction at age 47.  He has had a prior stroke----had workup in 2005 with MRA MRI and some vertebral stenosis.    Current Outpatient Prescriptions  Medication Sig Dispense Refill  . aspirin 81 MG EC tablet Take 1 tablet (81 mg total) by mouth daily. Swallow whole.  30 tablet  12  . atorvastatin (LIPITOR) 20 MG tablet Take 20 mg by mouth daily.        Allergies  Allergen Reactions  . Niacin     REACTION: hives  . Simvastatin Other (See Comments)    myalgias    Past Medical History  Diagnosis Date  . 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 TIA 12/03/2007  . CEREBROVASCULAR ACCIDENT, HX OF 09/20/2007  . HYPERLIPIDEMIA 12/18/2006  . PERIPHERAL VASCULAR DISEASE 09/20/2007  . BRADYCARDIA 09/20/2007  . DIVERTICULOSIS, COLON 09/20/2007  . COLONIC POLYPS, HX OF 12/18/2006    No past surgical history on file.  Family History  Problem Relation Age of Onset  . Heart disease Father   . Cancer Brother     History   Social History  . Marital Status: Married    Spouse Name: N/A    Number of Children: N/A  . Years of Education: N/A   Occupational History  . 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   Social History Main Topics  . Smoking status: Never Smoker   . Smokeless tobacco: Never Used  .  Alcohol Use: No  . Drug Use: No  . Sexually Active: Not on file   Other Topics Concern  . Not on file   Social History Narrative  . Owns a Hydrologist.      ROS: Please see the HPI.  All other systems reviewed and negative.  No history of bleeding or other issues.    PHYSICAL EXAM:  BP 178/81  Pulse 61  Ht 5\' 10"  (1.778 m)  Wt 190 lb (86.183 kg)  BMI 27.26 kg/m2  SpO2 99%  General: Well developed, well nourished, in no acute distress. Head:  Normocephalic and atraumatic. Neck: no JVD Lungs: Clear to auscultation and percussion. Heart: Normal S1 and S2.  No murmur, rubs or gallops.  Abdomen:  Normal bowel sounds; soft; non tender; no organomegaly Pulses: Pulses normal in all 4 extremities. Extremities: No clubbing or cyanosis. No edema. Neurologic: Alert and oriented x 3.  EKG:  See prior recording  ASSESSMENT AND PLAN:

## 2012-02-06 NOTE — Assessment & Plan Note (Signed)
PSA is elevated, so he will be seeing the urologist.

## 2012-02-06 NOTE — Patient Instructions (Addendum)
Your physician has requested that you have an exercise tolerance test. For further information please visit [email or web address]. Please also follow instruction sheet, as given.   

## 2012-02-06 NOTE — Assessment & Plan Note (Signed)
The patient has moderate shortness of breath with exertion which is new in onset, and also associated with some tightness. The findings suggest the possibility of exertional angina. We will recommend an exercise tolerance test to further evaluate his symptomatology. The patient has had a prior stroke, and does have evidence for an MRI of possible vertebral disease. Placing all of this in the context, his pretest probability of disease is high.  We would have low threshold for recommending cardiac catheterization.

## 2012-02-06 NOTE — Assessment & Plan Note (Signed)
Currently on a statin. 

## 2012-02-08 ENCOUNTER — Ambulatory Visit (INDEPENDENT_AMBULATORY_CARE_PROVIDER_SITE_OTHER): Payer: Medicare Other | Admitting: Cardiology

## 2012-02-08 DIAGNOSIS — Z8679 Personal history of other diseases of the circulatory system: Secondary | ICD-10-CM

## 2012-02-08 DIAGNOSIS — R079 Chest pain, unspecified: Secondary | ICD-10-CM

## 2012-02-08 DIAGNOSIS — R9439 Abnormal result of other cardiovascular function study: Secondary | ICD-10-CM

## 2012-02-08 NOTE — Progress Notes (Signed)
Exercise Treadmill Test  Pre-Exercise Testing Evaluation Rhythm: normal sinus  Rate: 64   PR:  .18 QRS:  .09  QT:  .40 QTc: .41            Test  Exercise Tolerance Test Ordering MD: Shawnie Pons, MD  Interpreting MD: Shawnie Pons, MD  Unique Test No: 1  Treadmill:  1  Indication for ETT: chest pain - rule out ischemia  Contraindication to ETT: Yes   Stress Modality: exercise - treadmill  Cardiac Imaging Performed: non   Protocol: standard Bruce - maximal  Max BP:  159//82  Max MPHR (bpm):  149 85% MPR (bpm):  115  MPHR obtained (bpm):  123 % MPHR obtained:  89%  Reached 85% MPHR (min:sec):  3:00 Total Exercise Time (min-sec):  3:50  Workload in METS:  6 Borg Scale: 14  Reason ETT Terminated:  angina chest pain    ST Segment Analysis At Rest: normal ST segments - no evidence of significant ST depression With Exercise: significant ischemic ST depression  Other Information Arrhythmia:  No Angina during ETT:  test stopped due to (2) Quality of ETT:  diagnostic  ETT Interpretation:  abnormal - evidence of ST depression consistent with ischemia  Comments: The patient exercised on the Bruce protocol for just over three minutes.  He had onset of typical substernal chest pain associated with horizontal ST depression, the T inversion.  The ST depression was between 2-9mm of characteristic change.  Interpreted as early positive stress.   Recommendations: I reviewed the findings with the patient and recommended cardiac catheterization.  Risks, benefits and alternatives were discussed in detail with the patient.  I suggested he be done this week although he wanted to defer to next week, but did agree to Friday.  Study will be set up for Dr. Clifton James.  Patient agreeble. Risks discussed.

## 2012-02-08 NOTE — Patient Instructions (Signed)
Your physician has requested that you have a cardiac catheterization. Cardiac catheterization is used to diagnose and/or treat various heart conditions. Doctors may recommend this procedure for a number of different reasons. The most common reason is to evaluate chest pain. Chest pain can be a symptom of coronary artery disease (CAD), and cardiac catheterization can show whether plaque is narrowing or blocking your heart's arteries. This procedure is also used to evaluate the valves, as well as measure the blood flow and oxygen levels in different parts of your heart. For further information please visit [email or web address]. Please follow instruction sheet, as given.  Your physician recommends that you continue on your current medications as directed. Please refer to the Current Medication list given to you today.  Your physician recommends that you have lab work today: BMP, CBC and PT/INR  

## 2012-02-09 ENCOUNTER — Encounter (HOSPITAL_COMMUNITY): Payer: Self-pay | Admitting: Pharmacy Technician

## 2012-02-09 LAB — BASIC METABOLIC PANEL
CO2: 29 mEq/L (ref 19–32)
Calcium: 9.1 mg/dL (ref 8.4–10.5)
Chloride: 102 mEq/L (ref 96–112)
Creatinine, Ser: 1 mg/dL (ref 0.4–1.5)
Glucose, Bld: 100 mg/dL — ABNORMAL HIGH (ref 70–99)

## 2012-02-09 LAB — PROTIME-INR: INR: 1.1 ratio — ABNORMAL HIGH (ref 0.8–1.0)

## 2012-02-09 LAB — CBC WITH DIFFERENTIAL/PLATELET
Basophils Absolute: 0 10*3/uL (ref 0.0–0.1)
Basophils Relative: 0.1 % (ref 0.0–3.0)
Eosinophils Absolute: 0.1 10*3/uL (ref 0.0–0.7)
Lymphocytes Relative: 12.5 % (ref 12.0–46.0)
MCHC: 34.1 g/dL (ref 30.0–36.0)
MCV: 98.3 fl (ref 78.0–100.0)
Monocytes Absolute: 0.6 10*3/uL (ref 0.1–1.0)
Neutro Abs: 5.9 10*3/uL (ref 1.4–7.7)
Neutrophils Relative %: 78.7 % — ABNORMAL HIGH (ref 43.0–77.0)
RBC: 4.93 Mil/uL (ref 4.22–5.81)
RDW: 12.3 % (ref 11.5–14.6)

## 2012-02-10 ENCOUNTER — Encounter (HOSPITAL_COMMUNITY): Payer: Self-pay | Admitting: *Deleted

## 2012-02-10 ENCOUNTER — Inpatient Hospital Stay (HOSPITAL_COMMUNITY): Payer: Medicare Other | Admitting: *Deleted

## 2012-02-10 ENCOUNTER — Other Ambulatory Visit: Payer: Self-pay | Admitting: *Deleted

## 2012-02-10 ENCOUNTER — Encounter (HOSPITAL_COMMUNITY)
Admission: RE | Disposition: A | Discharge status: Home / Self Care | Payer: Self-pay | Source: Ambulatory Visit | Attending: Cardiovascular Disease

## 2012-02-10 ENCOUNTER — Inpatient Hospital Stay (HOSPITAL_COMMUNITY)
Admission: RE | Admit: 2012-02-10 | Discharge: 2012-02-15 | DRG: 234 | Disposition: A | Discharge status: Home / Self Care | Payer: Medicare Other | Source: Ambulatory Visit | Attending: Cardiovascular Disease | Admitting: Cardiovascular Disease

## 2012-02-10 ENCOUNTER — Ambulatory Visit (HOSPITAL_COMMUNITY): Payer: Medicare Other

## 2012-02-10 ENCOUNTER — Inpatient Hospital Stay (HOSPITAL_COMMUNITY): Payer: Medicare Other

## 2012-02-10 DIAGNOSIS — J9819 Other pulmonary collapse: Secondary | ICD-10-CM | POA: Diagnosis not present

## 2012-02-10 DIAGNOSIS — I2510 Atherosclerotic heart disease of native coronary artery without angina pectoris: Principal | ICD-10-CM

## 2012-02-10 DIAGNOSIS — Z01810 Encounter for preprocedural cardiovascular examination: Secondary | ICD-10-CM

## 2012-02-10 DIAGNOSIS — Z8673 Personal history of transient ischemic attack (TIA), and cerebral infarction without residual deficits: Secondary | ICD-10-CM

## 2012-02-10 DIAGNOSIS — Z7982 Long term (current) use of aspirin: Secondary | ICD-10-CM

## 2012-02-10 DIAGNOSIS — D62 Acute posthemorrhagic anemia: Secondary | ICD-10-CM | POA: Diagnosis not present

## 2012-02-10 DIAGNOSIS — Y832 Surgical operation with anastomosis, bypass or graft as the cause of abnormal reaction of the patient, or of later complication, without mention of misadventure at the time of the procedure: Secondary | ICD-10-CM | POA: Diagnosis present

## 2012-02-10 DIAGNOSIS — I480 Paroxysmal atrial fibrillation: Secondary | ICD-10-CM

## 2012-02-10 DIAGNOSIS — Z951 Presence of aortocoronary bypass graft: Secondary | ICD-10-CM

## 2012-02-10 DIAGNOSIS — I519 Heart disease, unspecified: Secondary | ICD-10-CM | POA: Diagnosis not present

## 2012-02-10 DIAGNOSIS — I359 Nonrheumatic aortic valve disorder, unspecified: Secondary | ICD-10-CM

## 2012-02-10 DIAGNOSIS — I739 Peripheral vascular disease, unspecified: Secondary | ICD-10-CM | POA: Diagnosis present

## 2012-02-10 DIAGNOSIS — Y9210 Unspecified residential institution as the place of occurrence of the external cause: Secondary | ICD-10-CM | POA: Diagnosis present

## 2012-02-10 DIAGNOSIS — D696 Thrombocytopenia, unspecified: Secondary | ICD-10-CM | POA: Diagnosis not present

## 2012-02-10 DIAGNOSIS — I4891 Unspecified atrial fibrillation: Secondary | ICD-10-CM | POA: Diagnosis not present

## 2012-02-10 DIAGNOSIS — Z8546 Personal history of malignant neoplasm of prostate: Secondary | ICD-10-CM

## 2012-02-10 DIAGNOSIS — E785 Hyperlipidemia, unspecified: Secondary | ICD-10-CM | POA: Diagnosis present

## 2012-02-10 DIAGNOSIS — Z79899 Other long term (current) drug therapy: Secondary | ICD-10-CM

## 2012-02-10 DIAGNOSIS — Z8249 Family history of ischemic heart disease and other diseases of the circulatory system: Secondary | ICD-10-CM

## 2012-02-10 DIAGNOSIS — E8779 Other fluid overload: Secondary | ICD-10-CM | POA: Diagnosis not present

## 2012-02-10 DIAGNOSIS — Z23 Encounter for immunization: Secondary | ICD-10-CM

## 2012-02-10 HISTORY — PX: LEFT HEART CATHETERIZATION WITH CORONARY ANGIOGRAM: SHX5451

## 2012-02-10 HISTORY — PX: RADIAL ARTERY HARVEST: SHX5067

## 2012-02-10 HISTORY — PX: CORONARY ARTERY BYPASS GRAFT: SHX141

## 2012-02-10 HISTORY — PX: INTRAOPERATIVE TRANSESOPHAGEAL ECHOCARDIOGRAM: SHX5062

## 2012-02-10 LAB — PLATELET COUNT: Platelets: 159 10*3/uL (ref 150–400)

## 2012-02-10 LAB — HEMOGLOBIN AND HEMATOCRIT, BLOOD
HCT: 37 % — ABNORMAL LOW (ref 39.0–52.0)
Hemoglobin: 13.2 g/dL (ref 13.0–17.0)

## 2012-02-10 LAB — MRSA PCR SCREENING: MRSA by PCR: NEGATIVE

## 2012-02-10 LAB — ABO/RH: ABO/RH(D): A POS

## 2012-02-10 SURGERY — CORONARY ARTERY BYPASS GRAFTING (CABG)
Anesthesia: General | Site: Chest | Wound class: Clean

## 2012-02-10 SURGERY — LEFT HEART CATHETERIZATION WITH CORONARY ANGIOGRAM
Anesthesia: LOCAL

## 2012-02-10 MED ORDER — MIDAZOLAM HCL 5 MG/5ML IJ SOLN
INTRAMUSCULAR | Status: DC | PRN
  Administered 2012-02-10 (×2): 2 mg via INTRAVENOUS
  Administered 2012-02-10 (×2): 3 mg via INTRAVENOUS
  Administered 2012-02-11: 2 mg via INTRAVENOUS

## 2012-02-10 MED ORDER — LACTATED RINGERS IV SOLN
INTRAVENOUS | Status: DC | PRN
  Administered 2012-02-10: 17:00:00 via INTRAVENOUS

## 2012-02-10 MED ORDER — VERAPAMIL HCL 2.5 MG/ML IV SOLN
INTRAVENOUS | Status: AC
  Filled 2012-02-10: qty 2

## 2012-02-10 MED ORDER — SODIUM CHLORIDE 0.9 % IJ SOLN: 3.0000 mL | INTRAMUSCULAR | Status: DC | PRN

## 2012-02-10 MED ORDER — OXYCODONE-ACETAMINOPHEN 5-325 MG PO TABS: 1.0000 | ORAL_TABLET | ORAL | Status: DC | PRN

## 2012-02-10 MED ORDER — NITROGLYCERIN IN D5W 200-5 MCG/ML-% IV SOLN
2.0000 ug/min | INTRAVENOUS | Status: AC
  Administered 2012-02-10: 5 ug/min via INTRAVENOUS
  Filled 2012-02-10: qty 250

## 2012-02-10 MED ORDER — LIDOCAINE HCL (PF) 1 % IJ SOLN
INTRAMUSCULAR | Status: AC
  Filled 2012-02-10: qty 30

## 2012-02-10 MED ORDER — SODIUM CHLORIDE 0.9 % IV SOLN
INTRAVENOUS | Status: DC
  Administered 2012-02-10: 1000 mL via INTRAVENOUS

## 2012-02-10 MED ORDER — DEXTROSE 5 % IV SOLN
750.0000 mg | INTRAVENOUS | Status: DC
  Filled 2012-02-10: qty 750

## 2012-02-10 MED ORDER — BISACODYL 5 MG PO TBEC: 5.0000 mg | DELAYED_RELEASE_TABLET | Freq: Once | ORAL | Status: DC

## 2012-02-10 MED ORDER — SODIUM CHLORIDE 0.9 % IV SOLN
INTRAVENOUS | Status: AC
  Administered 2012-02-10: 69.8 mL/h via INTRAVENOUS
  Filled 2012-02-10: qty 40

## 2012-02-10 MED ORDER — DEXMEDETOMIDINE HCL IN NACL 400 MCG/100ML IV SOLN
0.1000 ug/kg/h | INTRAVENOUS | Status: AC
  Administered 2012-02-10: 0.2 ug/kg/h via INTRAVENOUS
  Filled 2012-02-10: qty 100

## 2012-02-10 MED ORDER — HEPARIN SODIUM (PORCINE) 1000 UNIT/ML IJ SOLN
INTRAMUSCULAR | Status: DC | PRN
  Administered 2012-02-10: 20000 [IU] via INTRAVENOUS
  Administered 2012-02-10: 3000 [IU] via INTRAVENOUS
  Administered 2012-02-10: 4000 [IU] via INTRAVENOUS
  Administered 2012-02-10: 3000 [IU] via INTRAVENOUS

## 2012-02-10 MED ORDER — POTASSIUM CHLORIDE 2 MEQ/ML IV SOLN
80.0000 meq | INTRAVENOUS | Status: DC
  Filled 2012-02-10: qty 40

## 2012-02-10 MED ORDER — FENTANYL CITRATE 0.05 MG/ML IJ SOLN
INTRAMUSCULAR | Status: DC | PRN
  Administered 2012-02-10: 250 ug via INTRAVENOUS
  Administered 2012-02-10: 850 ug via INTRAVENOUS
  Administered 2012-02-10: 50 ug via INTRAVENOUS
  Administered 2012-02-10: 100 ug via INTRAVENOUS
  Administered 2012-02-10 (×2): 250 ug via INTRAVENOUS

## 2012-02-10 MED ORDER — PHENYLEPHRINE HCL 10 MG/ML IJ SOLN
30.0000 ug/min | INTRAVENOUS | Status: AC
  Administered 2012-02-10: 20 ug/min via INTRAVENOUS
  Filled 2012-02-10: qty 2

## 2012-02-10 MED ORDER — DEXTROSE 5 % IV SOLN
1.5000 g | INTRAVENOUS | Status: AC
  Administered 2012-02-10: 1.5 g via INTRAVENOUS
  Administered 2012-02-10: .75 g via INTRAVENOUS
  Filled 2012-02-10: qty 1.5

## 2012-02-10 MED ORDER — SODIUM CHLORIDE 0.9 % IJ SOLN: 3.0000 mL | Freq: Two times a day (BID) | INTRAMUSCULAR | Status: DC

## 2012-02-10 MED ORDER — ONDANSETRON HCL 4 MG/2ML IJ SOLN: 4.0000 mg | Freq: Four times a day (QID) | INTRAMUSCULAR | Status: DC | PRN

## 2012-02-10 MED ORDER — ATORVASTATIN CALCIUM 20 MG PO TABS
20.0000 mg | ORAL_TABLET | Freq: Every day | ORAL | Status: DC
  Administered 2012-02-11 – 2012-02-14 (×4): 20 mg via ORAL
  Filled 2012-02-10 (×6): qty 1

## 2012-02-10 MED ORDER — SODIUM CHLORIDE 0.9 % IV SOLN: 250.0000 mL | INTRAVENOUS | Status: DC | PRN

## 2012-02-10 MED ORDER — HEPARIN SODIUM (PORCINE) 1000 UNIT/ML IJ SOLN
INTRAMUSCULAR | Status: AC
  Filled 2012-02-10: qty 1

## 2012-02-10 MED ORDER — VANCOMYCIN HCL 10 G IV SOLR
1250.0000 mg | INTRAVENOUS | Status: AC
  Administered 2012-02-10: 1250 mg via INTRAVENOUS
  Filled 2012-02-10: qty 1250

## 2012-02-10 MED ORDER — ASPIRIN EC 81 MG PO TBEC
81.0000 mg | DELAYED_RELEASE_TABLET | Freq: Every day | ORAL | Status: DC
  Filled 2012-02-10: qty 1

## 2012-02-10 MED ORDER — ARTIFICIAL TEARS OP OINT
TOPICAL_OINTMENT | OPHTHALMIC | Status: DC | PRN
  Administered 2012-02-10: 1 via OPHTHALMIC

## 2012-02-10 MED ORDER — NITROGLYCERIN 0.2 MG/ML ON CALL CATH LAB
INTRAVENOUS | Status: AC
  Filled 2012-02-10: qty 1

## 2012-02-10 MED ORDER — FENTANYL CITRATE 0.05 MG/ML IJ SOLN
INTRAMUSCULAR | Status: AC
  Filled 2012-02-10: qty 2

## 2012-02-10 MED ORDER — HEPARIN (PORCINE) IN NACL 2-0.9 UNIT/ML-% IJ SOLN
INTRAMUSCULAR | Status: AC
  Filled 2012-02-10: qty 1000

## 2012-02-10 MED ORDER — MORPHINE SULFATE 2 MG/ML IJ SOLN: 2.0000 mg | INTRAMUSCULAR | Status: DC | PRN

## 2012-02-10 MED ORDER — ALBUMIN HUMAN 5 % IV SOLN
INTRAVENOUS | Status: DC | PRN
  Administered 2012-02-10 – 2012-02-11 (×2): via INTRAVENOUS

## 2012-02-10 MED ORDER — METOPROLOL TARTRATE 12.5 MG HALF TABLET
12.5000 mg | ORAL_TABLET | Freq: Once | ORAL | Status: AC
  Administered 2012-02-10: 12.5 mg via ORAL
  Filled 2012-02-10: qty 1

## 2012-02-10 MED ORDER — HEMOSTATIC AGENTS (NO CHARGE) OPTIME
TOPICAL | Status: DC | PRN
  Administered 2012-02-10: 1 via TOPICAL

## 2012-02-10 MED ORDER — TEMAZEPAM 15 MG PO CAPS: 15.0000 mg | ORAL_CAPSULE | Freq: Once | ORAL | Status: AC | PRN

## 2012-02-10 MED ORDER — VERAPAMIL HCL 2.5 MG/ML IV SOLN
INTRAVENOUS | Status: AC
  Administered 2012-02-10: 22:00:00
  Filled 2012-02-10: qty 2.5

## 2012-02-10 MED ORDER — DIAZEPAM 5 MG PO TABS: 5.0000 mg | ORAL_TABLET | ORAL | Status: DC | PRN

## 2012-02-10 MED ORDER — PROPOFOL 10 MG/ML IV BOLUS
INTRAVENOUS | Status: DC | PRN
  Administered 2012-02-10: 50 mg via INTRAVENOUS

## 2012-02-10 MED ORDER — MIDAZOLAM HCL 2 MG/2ML IJ SOLN
INTRAMUSCULAR | Status: AC
  Administered 2012-02-11: 2 mg via INTRAVENOUS
  Filled 2012-02-10: qty 2

## 2012-02-10 MED ORDER — PROTAMINE SULFATE 10 MG/ML IV SOLN
INTRAVENOUS | Status: DC | PRN
  Administered 2012-02-10: 250 mg via INTRAVENOUS

## 2012-02-10 MED ORDER — ACETAMINOPHEN 325 MG PO TABS: 650.0000 mg | ORAL_TABLET | ORAL | Status: DC | PRN

## 2012-02-10 MED ORDER — CHLORHEXIDINE GLUCONATE 4 % EX LIQD
60.0000 mL | Freq: Once | CUTANEOUS | Status: DC
  Filled 2012-02-10: qty 60

## 2012-02-10 MED ORDER — ASPIRIN 81 MG PO CHEW
324.0000 mg | CHEWABLE_TABLET | ORAL | Status: AC
  Administered 2012-02-10: 324 mg via ORAL

## 2012-02-10 MED ORDER — DOPAMINE-DEXTROSE 3.2-5 MG/ML-% IV SOLN
2.0000 ug/kg/min | INTRAVENOUS | Status: AC
  Administered 2012-02-10: 3 ug/kg/min via INTRAVENOUS
  Filled 2012-02-10: qty 250

## 2012-02-10 MED ORDER — MAGNESIUM SULFATE 50 % IJ SOLN
40.0000 meq | INTRAMUSCULAR | Status: DC
  Filled 2012-02-10: qty 10

## 2012-02-10 MED ORDER — VECURONIUM BROMIDE 10 MG IV SOLR
INTRAVENOUS | Status: DC | PRN
  Administered 2012-02-10 (×3): 5 mg via INTRAVENOUS

## 2012-02-10 MED ORDER — SODIUM CHLORIDE 0.9 % IV SOLN
5.0000 g | INTRAVENOUS | Status: DC
  Filled 2012-02-10: qty 20

## 2012-02-10 MED ORDER — 0.9 % SODIUM CHLORIDE (POUR BTL) OPTIME
TOPICAL | Status: DC | PRN
  Administered 2012-02-10: 6000 mL

## 2012-02-10 MED ORDER — SODIUM CHLORIDE 0.9 % IJ SOLN
OROMUCOSAL | Status: DC | PRN
  Administered 2012-02-10 (×3): via TOPICAL

## 2012-02-10 MED ORDER — ROCURONIUM BROMIDE 100 MG/10ML IV SOLN
INTRAVENOUS | Status: DC | PRN
  Administered 2012-02-10: 100 mg via INTRAVENOUS

## 2012-02-10 MED ORDER — SODIUM CHLORIDE 0.9 % IV SOLN
INTRAVENOUS | Status: AC
  Administered 2012-02-10: .5 [IU]/h via INTRAVENOUS
  Filled 2012-02-10: qty 1

## 2012-02-10 MED ORDER — ASPIRIN 81 MG PO CHEW
CHEWABLE_TABLET | ORAL | Status: AC
  Filled 2012-02-10: qty 4

## 2012-02-10 MED ORDER — EPINEPHRINE HCL 1 MG/ML IJ SOLN
0.5000 ug/min | INTRAVENOUS | Status: DC
  Filled 2012-02-10: qty 4

## 2012-02-10 MED ORDER — SODIUM CHLORIDE 0.9 % IV SOLN: INTRAVENOUS | Status: AC

## 2012-02-10 SURGICAL SUPPLY — 123 items
ADAPTER CARDIO PERF ANTE/RETRO (ADAPTER) ×5 IMPLANT
ADH SKN CLS APL DERMABOND .7 (GAUZE/BANDAGES/DRESSINGS) ×3
ADPR PRFSN 84XANTGRD RTRGD (ADAPTER) ×3
ATTRACTOMAT 16X20 MAGNETIC DRP (DRAPES) ×5 IMPLANT
BAG DECANTER FOR FLEXI CONT (MISCELLANEOUS) ×5 IMPLANT
BANDAGE ELASTIC 4 VELCRO ST LF (GAUZE/BANDAGES/DRESSINGS) ×5 IMPLANT
BANDAGE ELASTIC 6 VELCRO ST LF (GAUZE/BANDAGES/DRESSINGS) ×5 IMPLANT
BANDAGE GAUZE ELAST BULKY 4 IN (GAUZE/BANDAGES/DRESSINGS) ×5 IMPLANT
BASKET HEART  (ORDER IN 25'S) (MISCELLANEOUS) ×1
BASKET HEART (ORDER IN 25'S) (MISCELLANEOUS) ×1
BASKET HEART (ORDER IN 25S) (MISCELLANEOUS) ×3 IMPLANT
BLADE STERNUM SYSTEM 6 (BLADE) ×5 IMPLANT
BLADE SURG 11 STRL SS (BLADE) ×2 IMPLANT
BLADE SURG 12 STRL SS (BLADE) ×5 IMPLANT
BLADE SURG ROTATE 9660 (MISCELLANEOUS) IMPLANT
CANISTER SUCTION 2500CC (MISCELLANEOUS) ×5 IMPLANT
CANNULA AORTIC HI-FLOW 6.5M20F (CANNULA) ×5 IMPLANT
CANNULA GUNDRY RCSP 15FR (MISCELLANEOUS) ×5 IMPLANT
CANNULA VENOUS MAL SGL STG 40 (MISCELLANEOUS) IMPLANT
CANNULAE VENOUS MAL SGL STG 40 (MISCELLANEOUS)
CATH CPB KIT VANTRIGT (MISCELLANEOUS) ×5 IMPLANT
CATH ROBINSON RED A/P 18FR (CATHETERS) ×15 IMPLANT
CATH THORACIC 28FR (CATHETERS) IMPLANT
CATH THORACIC 28FR RT ANG (CATHETERS) IMPLANT
CATH THORACIC 36FR (CATHETERS) ×2 IMPLANT
CATH THORACIC 36FR RT ANG (CATHETERS) ×10 IMPLANT
CLIP RETRACTION 3.0MM CORONARY (MISCELLANEOUS) ×2 IMPLANT
CLIP TI WIDE RED SMALL 24 (CLIP) IMPLANT
CLOTH BEACON ORANGE TIMEOUT ST (SAFETY) ×5 IMPLANT
COVER MAYO STAND STRL (DRAPES) ×2 IMPLANT
COVER SURGICAL LIGHT HANDLE (MISCELLANEOUS) ×5 IMPLANT
CRADLE DONUT ADULT HEAD (MISCELLANEOUS) ×5 IMPLANT
DERMABOND ADVANCED (GAUZE/BANDAGES/DRESSINGS) ×2
DERMABOND ADVANCED .7 DNX12 (GAUZE/BANDAGES/DRESSINGS) IMPLANT
DRAIN CHANNEL 32F RND 10.7 FF (WOUND CARE) ×7 IMPLANT
DRAPE CARDIOVASCULAR INCISE (DRAPES) ×5
DRAPE EXTREMITY T 121X128X90 (DRAPE) ×2 IMPLANT
DRAPE PROXIMA HALF (DRAPES) ×2 IMPLANT
DRAPE SLUSH MACHINE 52X66 (DRAPES) IMPLANT
DRAPE SLUSH/WARMER DISC (DRAPES) ×2 IMPLANT
DRAPE SRG 135X102X78XABS (DRAPES) ×3 IMPLANT
DRSG COVADERM 4X14 (GAUZE/BANDAGES/DRESSINGS) ×5 IMPLANT
ELECT BLADE 4.0 EZ CLEAN MEGAD (MISCELLANEOUS) ×5
ELECT BLADE 6.5 EXT (BLADE) ×5 IMPLANT
ELECT CAUTERY BLADE 6.4 (BLADE) ×7 IMPLANT
ELECT REM PT RETURN 9FT ADLT (ELECTROSURGICAL) ×10
ELECTRODE BLDE 4.0 EZ CLN MEGD (MISCELLANEOUS) ×3 IMPLANT
ELECTRODE REM PT RTRN 9FT ADLT (ELECTROSURGICAL) ×6 IMPLANT
GAUZE XEROFORM 5X9 LF (GAUZE/BANDAGES/DRESSINGS) ×2 IMPLANT
GEL ULTRASOUND 20GR AQUASONIC (MISCELLANEOUS) ×2 IMPLANT
GLOVE BIO SURGEON STRL SZ 6.5 (GLOVE) ×3 IMPLANT
GLOVE BIO SURGEON STRL SZ7 (GLOVE) ×4 IMPLANT
GLOVE BIO SURGEON STRL SZ7.5 (GLOVE) ×16 IMPLANT
GLOVE BIO SURGEONS STRL SZ 6.5 (GLOVE) ×3
GLOVE BIOGEL PI IND STRL 7.0 (GLOVE) IMPLANT
GLOVE BIOGEL PI INDICATOR 7.0 (GLOVE) ×16
GOWN STRL NON-REIN LRG LVL3 (GOWN DISPOSABLE) ×30 IMPLANT
HARMONIC SHEARS 14CM COAG (MISCELLANEOUS) ×2 IMPLANT
HEMOSTAT POWDER SURGIFOAM 1G (HEMOSTASIS) ×15 IMPLANT
HEMOSTAT SURGICEL 2X14 (HEMOSTASIS) ×5 IMPLANT
INSERT FOGARTY XLG (MISCELLANEOUS) IMPLANT
KIT BASIN OR (CUSTOM PROCEDURE TRAY) ×5 IMPLANT
KIT ROOM TURNOVER OR (KITS) ×5 IMPLANT
KIT SUCTION CATH 14FR (SUCTIONS) ×7 IMPLANT
KIT VASOVIEW W/TROCAR VH 2000 (KITS) ×5 IMPLANT
LEAD PACING MYOCARDI (MISCELLANEOUS) ×5 IMPLANT
MARKER GRAFT CORONARY BYPASS (MISCELLANEOUS) ×15 IMPLANT
NS IRRIG 1000ML POUR BTL (IV SOLUTION) ×27 IMPLANT
PACK OPEN HEART (CUSTOM PROCEDURE TRAY) ×5 IMPLANT
PAD ARMBOARD 7.5X6 YLW CONV (MISCELLANEOUS) ×10 IMPLANT
PENCIL BUTTON HOLSTER BLD 10FT (ELECTRODE) ×9 IMPLANT
PUNCH AORTIC ROTATE 4.0MM (MISCELLANEOUS) ×2 IMPLANT
PUNCH AORTIC ROTATE 4.5MM 8IN (MISCELLANEOUS) IMPLANT
PUNCH AORTIC ROTATE 5MM 8IN (MISCELLANEOUS) IMPLANT
SET CARDIOPLEGIA MPS 5001102 (MISCELLANEOUS) ×2 IMPLANT
SPONGE GAUZE 4X4 12PLY (GAUZE/BANDAGES/DRESSINGS) ×10 IMPLANT
SPONGE LAP 18X18 X RAY DECT (DISPOSABLE) ×4 IMPLANT
STAPLER VISISTAT 35W (STAPLE) ×2 IMPLANT
STOPCOCK MORSE 400PSI 3WAY (MISCELLANEOUS) ×2 IMPLANT
SURGIFLO TRUKIT (HEMOSTASIS) ×2 IMPLANT
SURGILUBE 3G PEEL PACK STRL (MISCELLANEOUS) ×2 IMPLANT
SUT BONE WAX W31G (SUTURE) ×5 IMPLANT
SUT MNCRL AB 4-0 PS2 18 (SUTURE) ×2 IMPLANT
SUT PROLENE 3 0 SH DA (SUTURE) IMPLANT
SUT PROLENE 3 0 SH1 36 (SUTURE) IMPLANT
SUT PROLENE 4 0 RB 1 (SUTURE) ×5
SUT PROLENE 4 0 SH DA (SUTURE) ×5 IMPLANT
SUT PROLENE 4-0 RB1 .5 CRCL 36 (SUTURE) ×3 IMPLANT
SUT PROLENE 5 0 C 1 36 (SUTURE) IMPLANT
SUT PROLENE 6 0 C 1 30 (SUTURE) ×4 IMPLANT
SUT PROLENE 6 0 CC (SUTURE) ×4 IMPLANT
SUT PROLENE 7 0 DA (SUTURE) IMPLANT
SUT PROLENE 7.0 RB 3 (SUTURE) ×15 IMPLANT
SUT PROLENE 8 0 BV175 6 (SUTURE) ×8 IMPLANT
SUT PROLENE BLUE 7 0 (SUTURE) ×5 IMPLANT
SUT SILK  1 MH (SUTURE)
SUT SILK 1 MH (SUTURE) IMPLANT
SUT SILK 2 0 SH CR/8 (SUTURE) ×2 IMPLANT
SUT SILK 3 0 SH CR/8 (SUTURE) IMPLANT
SUT STEEL 6MS V (SUTURE) ×10 IMPLANT
SUT STEEL STERNAL CCS#1 18IN (SUTURE) IMPLANT
SUT STEEL SZ 6 DBL 3X14 BALL (SUTURE) ×5 IMPLANT
SUT VIC AB 1 CTX 36 (SUTURE) ×10
SUT VIC AB 1 CTX36XBRD ANBCTR (SUTURE) ×6 IMPLANT
SUT VIC AB 2-0 CT1 27 (SUTURE) ×5
SUT VIC AB 2-0 CT1 TAPERPNT 27 (SUTURE) IMPLANT
SUT VIC AB 2-0 CTX 27 (SUTURE) IMPLANT
SUT VIC AB 3-0 SH 27 (SUTURE)
SUT VIC AB 3-0 SH 27X BRD (SUTURE) IMPLANT
SUT VIC AB 3-0 X1 27 (SUTURE) IMPLANT
SUT VICRYL 4-0 PS2 18IN ABS (SUTURE) IMPLANT
SUTURE E-PAK OPEN HEART (SUTURE) ×5 IMPLANT
SYSTEM SAHARA CHEST DRAIN ATS (WOUND CARE) ×5 IMPLANT
TAPE CLOTH SURG 4X10 WHT LF (GAUZE/BANDAGES/DRESSINGS) ×2 IMPLANT
TAPE PAPER 2X10 WHT MICROPORE (GAUZE/BANDAGES/DRESSINGS) ×2 IMPLANT
TOWEL OR 17X24 6PK STRL BLUE (TOWEL DISPOSABLE) ×12 IMPLANT
TOWEL OR 17X26 10 PK STRL BLUE (TOWEL DISPOSABLE) ×10 IMPLANT
TRAY CATH LUMEN 1 20CM STRL (SET/KITS/TRAYS/PACK) ×2 IMPLANT
TRAY FOLEY IC TEMP SENS 14FR (CATHETERS) ×5 IMPLANT
TUBING ART PRESS 48 MALE/FEM (TUBING) ×4 IMPLANT
TUBING INSUFFLATION 10FT LAP (TUBING) ×5 IMPLANT
UNDERPAD 30X30 INCONTINENT (UNDERPADS AND DIAPERS) ×7 IMPLANT
WATER STERILE IRR 1000ML POUR (IV SOLUTION) ×10 IMPLANT

## 2012-02-10 NOTE — OR Nursing (Signed)
23:45pm - called SICU charge nurse and ask that the patient family be informed that we are off pump.

## 2012-02-10 NOTE — Progress Notes (Addendum)
VASCULAR LAB PRELIMINARY  PRELIMINARY  PRELIMINARY  PRELIMINARY  Pre-op Cardiac Surgery  Carotid Findings:  Bilateral:  No evidence of hemodynamically significant internal carotid artery stenosis.   Vertebral artery flow is antegrade.      Upper Extremity Right Left  Brachial Pressures 143-Triphasic Triphasic  Radial Waveforms Triphasic Triphasic  Ulnar Waveforms Triphasic Triphasic  Palmar Arch (Allen's Test)     Findings:  Unable to obtain left brachial pressure, and left palmar arch due to restricted limb. Unable to obtain right palmar arch due to recent cath.   Lower  Extremity Right Left  Dorsalis Pedis    Anterior Tibial    Posterior Tibial    Ankle/Brachial Indices      Findings:  Bilateral palpable pedal pulses.   CESTONE, HELENE, RVT 02/10/2012, 9:56 AM    02/10/2012 12:49 PM Gertie Fey, RDMS, RDCS

## 2012-02-10 NOTE — Interval H&P Note (Signed)
History and Physical Interval Note:  02/10/2012 7:23 AM  [name] [name]  has presented today for cardiac cath with positive stress test, chest pain c/w unstable angina.   The various methods of treatment have been discussed with the patient and family. After consideration of risks, benefits and other options for treatment, the patient has consented to  Procedure(s) (LRB) with comments: LEFT HEART CATHETERIZATION WITH CORONARY ANGIOGRAM (N/A) as a surgical intervention .  The patient's history has been reviewed, patient examined, no change in status, stable for surgery.  I have reviewed the patient's chart and labs.  Questions were answered to the patient's satisfaction.     MCALHANY,CHRISTOPHER

## 2012-02-10 NOTE — Progress Notes (Signed)
The patient was examined and preop studies reviewed. There has been no change from the prior exam and the patient is ready for surgery.  Plan urgent CABG today on [name] [name] will plan on using L radial artery if conduit a problem- discussed with patient

## 2012-02-10 NOTE — Progress Notes (Signed)
Utilization Review Completed.   Jelani Trueba, RN, BSN Nurse Case Manager  [phone]  

## 2012-02-10 NOTE — Preoperative (Addendum)
Beta Blockers   Reason not to administer Beta Blockers:Not Applicable, last dose 02/10/12 at 16:40

## 2012-02-10 NOTE — Brief Op Note (Signed)
02/10/2012  11:16 PM  PATIENT:  [name] [name]  72 y.o. male  PRE-OPERATIVE DIAGNOSIS:  CAD  POST-OPERATIVE DIAGNOSIS:  CAD  PROCEDURE:  INTRAOPERATIVE TRANSESOPHAGEAL ECHOCARDIOGRAM, URGENT MEDIAN STERNOTOMY for CORONARY ARTERY BYPASS GRAFTING (CABG) x 3 (LIMA to LAD, LEFT RADIAL ARTERY to CIRCUMFLEX, and RIMA to RCA)  using Bilateral mammary artery harvest and left open radial  harvest   SURGEON:  Surgeon(s) and Role:    * Kerin Perna, MD - Primary  PHYSICIAN ASSISTANT: Doree Fudge PA-C   ANESTHESIA:   general  EBL:  Total I/O In: 1050 [I.V.:1050] Out: 1080 [Urine:1080]   DRAINS:  Chest Tube(s) in the Mediastinal and pleural spaces    COUNTS CORRECT:  YES  DICTATION: .Dragon Dictation  PLAN OF CARE: Admit to inpatient   PATIENT DISPOSITION:  ICU - intubated and hemodynamically stable.   Delay start of Pharmacological VTE agent (>24hrs) due to surgical blood loss or risk of bleeding: yes  PRE OP WEIGHT: 84 kg

## 2012-02-10 NOTE — CV Procedure (Signed)
    Cardiac Catheterization Operative Report  Ramello Cordial [id number] 1/24/20148:06 AM Oliver Barre, MD  Procedure Performed:  1. Left Heart Catheterization 2. Selective Coronary Angiography 3. Left ventricular angiogram  Operator: Verne Carrow, MD  Arterial access site:  Right radial artery.   Indication:  72 yo male with history of  Hyperlipidemia here today for cardiac cath. Recent chest pain c/w unstable angina. Treadmill stress test positive.                                   Procedure Details: The risks, benefits, complications, treatment options, and expected outcomes were discussed with the patient. The patient and/or family concurred with the proposed plan, giving informed consent. The patient was brought to the cath lab after IV hydration was begun and oral premedication was given. The patient was further sedated with Versed and Fentanyl. The right wrist was assessed with an Allens test which was positive. The right wrist was prepped and draped in a sterile fashion. 1% lidocaine was used for local anesthesia. Using the modified Seldinger access technique, a 5 French sheath was placed in the right radial artery. 3 mg Verapamil was given through the sheath. 4000 units IV heparin was given. Standard diagnostic catheters were used to perform selective coronary angiography. A pigtail catheter was used to perform a left ventricular angiogram. The sheath was removed from the right radial artery and a Terumo hemostasis band was applied at the arteriotomy site on the right wrist.    There were no immediate complications. The patient was taken to the recovery area in stable condition.   Hemodynamic Findings: Central aortic pressure: 139/66 Left ventricular pressure: 135/7/13  Angiographic Findings:  Left main: Distal 99% stenosis.   Left Anterior Descending Artery: Large caliber vessel that courses to the apex. There is calcification in the proximal vessel. The proximal vessel  has a 90% stenosis. The mid vessel has serial 50% stenoses. The distal vessel has mild plaque disease. The diagonal branch is small to moderate sized and has mild plaque disease.   Circumflex Artery: Large caliber, calcified vessel with 30% stenosis in the proximal vessel. The obtuse marginal is moderate in caliber and has 30% serial stenoses.   Right Coronary Artery: Large, dominant vessel with 99% ostial stenosis. The mid vessel has serial 30% stenoses with moderate calcification. The distal vessel has a 40-50% stenosis just before the bifurcation. The PDA and posterolateral branches are moderate in caliber with diffuse plaque but no focally obstructive lesions.   Left Ventricular Angiogram: LVEF=60-65%.   Impression: 1. 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  2. Unstable angina.  3. Preserved LV systolic function  Recommendations: Will consult CT surgery for CABG.        Complications:  None. The patient tolerated the procedure well.

## 2012-02-10 NOTE — Anesthesia Preprocedure Evaluation (Addendum)
Anesthesia Evaluation  Patient identified by MRN, date of birth, ID band Patient awake    Reviewed: Allergy & Precautions, H&P , NPO status , Patient's Chart, lab work & pertinent test results, reviewed documented beta blocker date and time   Airway Mallampati: I TM Distance: >3 FB Neck ROM: Full    Dental No notable dental hx. (+) Teeth Intact and Dental Advisory Given   Pulmonary shortness of breath,  breath sounds clear to auscultation  Pulmonary exam normal       Cardiovascular hypertension, On Medications and On Home Beta Blockers + CAD and + Peripheral Vascular Disease + dysrhythmias Rhythm:Regular Rate:Normal     Neuro/Psych TIANo Residual Symptoms negative psych ROS   GI/Hepatic negative GI ROS, Neg liver ROS,   Endo/Other  negative endocrine ROS  Renal/GU negative Renal ROS  negative genitourinary   Musculoskeletal   Abdominal   Peds  Hematology negative hematology ROS (+)   Anesthesia Other Findings   Reproductive/Obstetrics negative OB ROS                          Anesthesia Physical Anesthesia Plan  ASA: IV  Anesthesia Plan: General   Post-op Pain Management:    Induction: Intravenous  Airway Management Planned: Oral ETT  Additional Equipment: Arterial line, CVP, PA Cath, Ultrasound Guidance Line Placement and TEE  Intra-op Plan:   Post-operative Plan: Post-operative intubation/ventilation  Informed Consent: I have reviewed the patients History and Physical, chart, labs and discussed the procedure including the risks, benefits and alternatives for the proposed anesthesia with the patient or authorized representative who has indicated his/her understanding and acceptance.   Dental advisory given  Plan Discussed with: CRNA  Anesthesia Plan Comments:         Anesthesia Quick Evaluation

## 2012-02-10 NOTE — H&P (View-Only) (Signed)
   HPI:  This very nice gentleman is recurred through the courtesy of Dr. Jonny Ruiz. 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 It seems to be more prominent when it is warm and cold when he is walking he'll. When he is doing a ladder, he will notice a little bit of burning in the chest. He says now he stops drinking cold weather. He did walk 2 flights of stairs up. They, and did not have a problem. He denies any rest symptoms. There is no significant radiation of the complaints. His blood pressure is elevated in the office today, but he says this is unusual, and is normal in okay patient has no prior cardiac history, although his father did die of a myocardial infarction at age 81.  He has had a prior stroke----had workup in 2005 with MRA MRI and some vertebral stenosis.    Current Outpatient Prescriptions  Medication Sig Dispense Refill  . aspirin 81 MG EC tablet Take 1 tablet (81 mg total) by mouth daily. Swallow whole.  30 tablet  12  . atorvastatin (LIPITOR) 20 MG tablet Take 20 mg by mouth daily.        Allergies  Allergen Reactions  . Niacin     REACTION: hives  . Simvastatin Other (See Comments)    myalgias    Past Medical History  Diagnosis Date  . 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 TIA 12/03/2007  . CEREBROVASCULAR ACCIDENT, HX OF 09/20/2007  . HYPERLIPIDEMIA 12/18/2006  . PERIPHERAL VASCULAR DISEASE 09/20/2007  . BRADYCARDIA 09/20/2007  . DIVERTICULOSIS, COLON 09/20/2007  . COLONIC POLYPS, HX OF 12/18/2006    No past surgical history on file.  Family History  Problem Relation Age of Onset  . Heart disease Father   . Cancer Brother     History   Social History  . Marital Status: Married    Spouse Name: N/A    Number of Children: N/A  . Years of Education: N/A   Occupational History  . 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   Social History Main Topics  . Smoking status: Never Smoker   . Smokeless  tobacco: Never Used  . Alcohol Use: No  . Drug Use: No  . Sexually Active: Not on file   Other Topics Concern  . Not on file   Social History Narrative  . Owns a Hydrologist.      ROS: Please see the HPI.  All other systems reviewed and negative.  No history of bleeding or other issues.    PHYSICAL EXAM:  BP 178/81  Pulse 61  Ht 5\' 10"  (1.778 m)  Wt 190 lb (86.183 kg)  BMI 27.26 kg/m2  SpO2 99%  General: Well developed, well nourished, in no acute distress. Head:  Normocephalic and atraumatic. Neck: no JVD Lungs: Clear to auscultation and percussion. Heart: Normal S1 and S2.  No murmur, rubs or gallops.  Abdomen:  Normal bowel sounds; soft; non tender; no organomegaly Pulses: Pulses normal in all 4 extremities. Extremities: No clubbing or cyanosis. No edema. Neurologic: Alert and oriented x 3.  EKG:  See prior recording  ASSESSMENT AND PLAN:

## 2012-02-10 NOTE — Progress Notes (Signed)
  Echocardiogram 2D Echocardiogram has been performed.  [name] [name] 02/10/2012, 11:23 AM

## 2012-02-10 NOTE — Progress Notes (Signed)
PFT completed at bedside. Unconfirmed results sent to Cath Lab.

## 2012-02-10 NOTE — H&P (Signed)
[name] Pons, MD  02/06/2012  2:27 PM  Signed    HPI:  This very nice gentleman is referred through the courtesy of Dr. Jonny Ruiz. 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 He has noticed since the early fall some shortness of breath with exertion. It seems to be more prominent when it is warm and cold when he is walking he'll notice it. When he is going up steps, he will notice a little bit of burning in the chest. He says now he stops walking in cold weather. He did walk 2 flights of stairs up the other day and did not have a problem. He denies any rest symptoms. There is no significant radiation of the complaints.His father did die of a myocardial infarction at age 47.  He has had a prior stroke----had workup in 2005 with MRA MRI and some vertebral stenosis.  He underwent exercise testing and this was positive toward the end of stage 1 with symptoms, and I rcommended cath study.  Risk, benefits and alternatives were discussed with the patient, and he consented to proceed.  Dr. Clifton James will perform.      Current Outpatient Prescriptions   Medication  Sig  Dispense  Refill   .  aspirin 81 MG EC tablet  Take 1 tablet (81 mg total) by mouth daily. Swallow whole.   30 tablet   12   .  atorvastatin (LIPITOR) 20 MG tablet  Take 20 mg by mouth daily.             Allergies   Allergen  Reactions   .  Niacin         REACTION: hives   .  Simvastatin  Other (See Comments)       myalgias       Past Medical History   Diagnosis  Date   .  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  TIA  12/03/2007   .  CEREBROVASCULAR ACCIDENT, HX OF  09/20/2007   .  HYPERLIPIDEMIA  12/18/2006   .  PERIPHERAL VASCULAR DISEASE  09/20/2007   .  BRADYCARDIA  09/20/2007   .  DIVERTICULOSIS, COLON  09/20/2007   .  COLONIC POLYPS, HX OF  12/18/2006      No past surgical history on file.    Family History   Problem  Relation  Age of Onset   .  Heart disease  Father     .  Cancer  Brother         History       Social History   .   Marital Status:  Married       Spouse Name:  N/A       Number of Children:  N/A   .  Years of Education:  N/A       Occupational History   .  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       Social History Main Topics   .  Smoking status:  Never Smoker    .  Smokeless tobacco:  Never Used   .  Alcohol Use:  No   .  Drug Use:  No   .  Sexually Active:  Not on file       Other Topics  Concern   .  Not on file       Social History Narrative   .  Owns a Hydrologist.          ROS: Please see the HPI.  All other systems reviewed and negative.  No history of bleeding or other issues.     PHYSICAL EXAM:   BP 178/81  Pulse 61  Ht 5\' 10"  (1.778 m)  Wt 190 lb (86.183 kg)  BMI 27.26 kg/m2  SpO2 99%   General: Well developed, well nourished, in no acute distress. Head:  Normocephalic and atraumatic. Neck: no JVD Lungs: Clear to auscultation and percussion. Heart: Normal S1 and S2.  No murmur, rubs or gallops.   Abdomen:  Normal bowel sounds; soft; non tender; no organomegaly Pulses: Pulses normal in all 4 extremities. Extremities: No clubbing or cyanosis. No edema. Neurologic: Alert and oriented x 3.   EKG:  See prior recording GXT:  See report Labs: checked   ASSESSMENT AND PLAN:     SOB (shortness of breath) - [name] Pons, MD  02/06/2012  2:25 PM  Signed The patient has moderate shortness of breath with exertion which is new in onset, and also associated with some tightness. The findings suggest the possibility of exertional angina. Exercise testing is abnormal, so we will proceed with cardiac cath to identify his anatomy.   ADENOCARCINOMA, PROSTATE - [name] Pons, MD  02/06/2012  2:26 PM  Signed PSA is elevated, so he will be seeing the urologist.    [name], MD  02/06/2012  2:26 PM  Signed Currently on a statin.       Electronic signature on 02/06/2012

## 2012-02-10 NOTE — Consult Note (Signed)
[address]            [place] [address]          [phone]       Iann Rodier [facility] Medical Record #[id number] Date of Birth: February 15, 1940  No ref. provider found Oliver Barre, MD  Chief Complaint:   No chief complaint on file.  shortness of breath with exertion increasing intensity and frequency Cardiac catheterization today showing left main and severe three-vessel coronary disease 2-D echocardiogram shows good LV function  History of Present Illness:     72 year old Caucasian male nonsmoker with progressive dyspnea on exertion and decreasing exercise tolerance. Stress test was positive for ST segment depression. Outpatient cardiac catheterization done via the right radial artery shows 90% left main stenosis an ostial 90% RCA stenosis. Patient has not been on Plavix. He has been on a lipid agent and aspirin but not a beta blocker.  Urgent surgical coronary pressurization has been recommended by his cardiologist due to his coronary anatomy   Current Activity/ Functional Status: Full functional activity lives with his wife independently   Past Medical History  Diagnosis Date  . 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 TIA 12/03/2007  . CEREBROVASCULAR ACCIDENT, HX OF 09/20/2007  . HYPERLIPIDEMIA 12/18/2006  . PERIPHERAL VASCULAR DISEASE 09/20/2007  . BRADYCARDIA 09/20/2007  . DIVERTICULOSIS, COLON 09/20/2007  . COLONIC POLYPS, HX OF 12/18/2006    No past surgical history on file.  History  Smoking status  . Never Smoker   Smokeless tobacco  . Never Used    History  Alcohol Use No    History   Social History  . Marital Status: Married    Spouse Name: N/A    Number of Children: N/A  . Years of Education: N/A   Occupational History  . Not on file.   Social History Main Topics  . Smoking status: Never Smoker   . Smokeless tobacco: Never Used  . Alcohol Use: No  . Drug Use: No  . Sexually Active: Not on file   Other  Topics Concern  . Not on file   Social History Narrative  . No narrative on file    Allergies  Allergen Reactions  . Niacin     REACTION: hives  . Simvastatin Other (See Comments)    myalgias    Current Facility-Administered Medications  Medication Dose Route Frequency Provider Last Rate Last Dose  . 0.9 %  sodium chloride infusion   Intravenous Continuous Kathleene Hazel, MD 75 mL/hr at 02/10/12 1214 75 mL/hr at 02/10/12 1214  . acetaminophen (TYLENOL) tablet 650 mg  650 mg Oral Q4H PRN Kathleene Hazel, MD      . aminocaproic acid (AMICAR) 10 g in sodium chloride 0.9 % 100 mL infusion   Intravenous To OR Kathleene Hazel, MD      . aspirin EC tablet 81 mg  81 mg Oral Daily Kathleene Hazel, MD      . atorvastatin (LIPITOR) tablet 20 mg  20 mg Oral q1800 Kathleene Hazel, MD      . bisacodyl (DULCOLAX) EC tablet 5 mg  5 mg Oral Once Kerin Perna, MD      . cefUROXime (ZINACEF) 1.5 g in dextrose 5 % 50 mL IVPB  1.5 g Intravenous To OR Kathleene Hazel, MD      . cefUROXime (ZINACEF) 750  mg in dextrose 5 % 50 mL IVPB  750 mg Intravenous To OR Kathleene Hazel, MD      . chlorhexidine (HIBICLENS) 4 % liquid 4 application  60 mL Topical Once Kerin Perna, MD      . dexmedetomidine (PRECEDEX) 400 MCG/100ML infusion  0.1-0.7 mcg/kg/hr Intravenous To OR Kathleene Hazel, MD      . diazepam (VALIUM) tablet 5-10 mg  5-10 mg Oral Q4H PRN Kerin Perna, MD      . DOPamine (INTROPIN) 800 mg in dextrose 5 % 250 mL infusion  2-20 mcg/kg/min Intravenous To OR Kathleene Hazel, MD      . EPINEPHrine (ADRENALIN) 4,000 mcg in dextrose 5 % 250 mL infusion  0.5-20 mcg/min Intravenous To OR Kathleene Hazel, MD      . insulin regular (NOVOLIN R,HUMULIN R) 1 Units/mL in sodium chloride 0.9 % 100 mL infusion   Intravenous To OR Kathleene Hazel, MD      . magnesium sulfate (IV Push/IM) injection 40 mEq  40 mEq Other To OR Kathleene Hazel, MD      . metoprolol tartrate (LOPRESSOR) tablet 12.5 mg  12.5 mg Oral Once Kerin Perna, MD      . morphine 2 MG/ML injection 2 mg  2 mg Intravenous Q1H PRN Kathleene Hazel, MD      . nitroGLYCERIN 0.2 mg/mL in dextrose 5 % infusion  2-200 mcg/min Intravenous To OR Kathleene Hazel, MD      . nitroglycerin-verapamil-HEPARIN-sodium bicarbonate irrigation for artery spasm   Irrigation To OR Kathleene Hazel, MD      . ondansetron [facility]) injection 4 mg  4 mg Intravenous Q6H PRN Kathleene Hazel, MD      . oxyCODONE-acetaminophen (PERCOCET/ROXICET) 5-325 MG per tablet 1-2 tablet  1-2 tablet Oral Q4H PRN Kathleene Hazel, MD      . phenylephrine (NEO-SYNEPHRINE) 20,000 mcg in dextrose 5 % 250 mL infusion  30-200 mcg/min Intravenous To OR Kathleene Hazel, MD      . potassium chloride injection 80 mEq  80 mEq Other To OR Kathleene Hazel, MD      . temazepam (RESTORIL) capsule 15 mg  15 mg Oral Once PRN Kerin Perna, MD      . vancomycin (VANCOCIN) 1,250 mg in sodium chloride 0.9 % 250 mL IVPB  1,250 mg Intravenous To OR Kathleene Hazel, MD         Family History  Problem Relation Age of Onset  . Heart disease Father   . Cancer Brother      Review of Systems:  Patient has had a previous posterior circulation stroke. Preop carotid Doppler studies are pending. The patient is right-hand dominant. The patient has had bilateral greater saphenous vein laser ablation for varicose veins and venous insufficiency at [facility] The patient has known diagnosis of prostate cancer followed by Dr. Hubert Azure., the last biopsies were negative but his PSA is elevated 11.1 he denies any frequency nocturia or troubles with his urine stream The patient has had no previous thoracic trauma The patient denies history diabetes His had no classic chest pain mainly symptoms consist of shortness of breath and decreasing exercise tolerance   Cardiac Review of  Systems: Y or N  Chest Pain [  n  ]  Resting SOB [ n  ] Exertional SOB  Cove.Etienne  ]  Pollyann Kennedy Milo.Brash  ]   Pedal Edema [  n ]  Palpitations n[  ] Syncope  [ n ]   Presyncope [   ]  General Review of Systems: [Y] = yes [  ]=no Constitional: recent weight change [  ]; anorexia [  ]; fatigue [  ]; nausea [  ]; night sweats [  ]; fever [  ]; or chills [  ];                                                                                                                                          Dental: poor dentition[  ]; Last Dentist visit:yearly  Eye : blurred vision [  ]; diplopia [   ]; vision changes [  ];  Amaurosis fugax[  ]; Resp: cough [  ];  wheezing[  ];  hemoptysis[  ]; shortness of breath[  ]; paroxysmal nocturnal dyspnea[  ]; dyspnea on exertion[ y ]; or orthopnea[  ];  GI:  gallstones[  ], vomiting[  ];  dysphagia[  ]; melena[  ];  hematochezia [  ]; heartburn[  ];   Hx of  Colonoscopy[  ]; GU: kidney stones [  ]; hematuria[  ];   dysuria [  ];  nocturia[  ];  history of     obstruction [  ];                 Skin: rash, swelling[  ];, hair loss[  ];  peripheral edema[  ];  or itching[  ]; Musculosketetal: myalgias[  ];  joint swelling[  ];  joint erythema[  ];  joint pain[  ];  back pain[  ];  Heme/Lymph: bruising[  ];  bleeding[  ];  anemia[  ];  Neuro: TIA[  ];  headaches[  ];  stroke[  ];  vertigo[  ];  seizures[  ];   paresthesias[  ];  difficulty walking[  ];  Psych:depression[  ]; anxiety[  ];  Endocrine: diabetes[  ];  thyroid dysfunction[  ];  Immunizations: Flu [  ]; Pneumococcal[  ];  Other:  Physical Exam: BP 136/69  Pulse 55  Temp 97.3 F (36.3 C) (Oral)  Resp 18  Ht 5\' 10"  (1.778 m)  Wt 185 lb (83.915 kg)  BMI 26.54 kg/m2  SpO2 100%  General appearance well-developed middle-aged Caucasian male no acute distress HEENT normocephalic pupils equal dentition good Neck no JVD mass or carotid bruit Thorax no deformity or tenderness, breath sounds clear and equal, chest x-ray  clear Abdomen soft nontender without pulsatile mass Extremities no clubbing cyanosis or edema compression dressing over her right wrist for radial artery catheterization site Vascular palpable pulses in all extremities left palm R. arch circulation appears intact using the pulse oximeter Bilateral varicose veins right greater than left Neurologic no focal motor deficit   Diagnostic Studies & Laboratory data:   Chest x-ray 2-D echo and cardiac catheterization results reviewed with patient and  family  Recent Radiology Findings:   No results found.    Recent Lab Findings: Lab Results  Component Value Date   WBC 7.5 02/08/2012   HGB 16.5 02/08/2012   HCT 48.5 02/08/2012   PLT 212.0 02/08/2012   GLUCOSE 100* 02/08/2012   CHOL 189 02/03/2012   TRIG 97.0 02/03/2012   HDL 47.10 02/03/2012   LDLDIRECT 179.4 12/23/2010   LDLCALC 123* 02/03/2012   ALT 29 02/03/2012   AST 30 02/03/2012   NA 138 02/08/2012   K 4.1 02/08/2012   CL 102 02/08/2012   CREATININE 1.0 02/08/2012   BUN 10 02/08/2012   CO2 29 02/08/2012   TSH 1.80 02/03/2012   INR 1.1* 02/08/2012      Assessment / Plan:     Severe left main and multivessel disease with good LV function Plan surgical coronary graduation Conduit will consist of intramammary artery and left radial artery free graft We will examine the left greater saphenous vein but it may have been ablated by previous laser therapy  Procedure indications risks and alternatives discussed with patient and he agrees to proceed.    Dyspnea on shortness of breath

## 2012-02-11 ENCOUNTER — Inpatient Hospital Stay (HOSPITAL_COMMUNITY): Payer: Medicare Other

## 2012-02-11 DIAGNOSIS — I2510 Atherosclerotic heart disease of native coronary artery without angina pectoris: Principal | ICD-10-CM

## 2012-02-11 LAB — POCT I-STAT 3, ART BLOOD GAS (G3+)
Acid-base deficit: 6 mmol/L — ABNORMAL HIGH (ref 0.0–2.0)
Acid-base deficit: 1 mmol/L (ref 0.0–2.0)
Bicarbonate: 26.3 mEq/L — ABNORMAL HIGH (ref 20.0–24.0)
Bicarbonate: 19.1 mEq/L — ABNORMAL LOW (ref 20.0–24.0)
Bicarbonate: 21.9 mEq/L (ref 20.0–24.0)
O2 Saturation: 100 %
O2 Saturation: 99 %
Patient temperature: 37.6
Patient temperature: 37.7
TCO2: 28 mmol/L (ref 0–100)
TCO2: 28 mmol/L (ref 0–100)
TCO2: 20 mmol/L (ref 0–100)
TCO2: 23 mmol/L (ref 0–100)
pCO2 arterial: 28.7 mmHg — ABNORMAL LOW (ref 35.0–45.0)
pCO2 arterial: 29.5 mmHg — ABNORMAL LOW (ref 35.0–45.0)
pCO2 arterial: 37.1 mmHg (ref 35.0–45.0)
pCO2 arterial: 41.7 mmHg (ref 35.0–45.0)
pCO2 arterial: 42.5 mmHg (ref 35.0–45.0)
pCO2 arterial: 45.7 mmHg — ABNORMAL HIGH (ref 35.0–45.0)
pH, Arterial: 7.349 — ABNORMAL LOW (ref 7.350–7.450)
pH, Arterial: 7.368 (ref 7.350–7.450)
pH, Arterial: 7.387 (ref 7.350–7.450)
pH, Arterial: 7.419 (ref 7.350–7.450)
pH, Arterial: 7.425 (ref 7.350–7.450)
pH, Arterial: 7.473 — ABNORMAL HIGH (ref 7.350–7.450)
pO2, Arterial: 524 mmHg — ABNORMAL HIGH (ref 80.0–100.0)
pO2, Arterial: 343 mmHg — ABNORMAL HIGH (ref 80.0–100.0)
pO2, Arterial: 71 mmHg — ABNORMAL LOW (ref 80.0–100.0)
pO2, Arterial: 156 mmHg — ABNORMAL HIGH (ref 80.0–100.0)

## 2012-02-11 LAB — CBC
HCT: 39.1 % (ref 39.0–52.0)
HCT: 33 % — ABNORMAL LOW (ref 39.0–52.0)
Hemoglobin: 14 g/dL (ref 13.0–17.0)
Hemoglobin: 11.9 g/dL — ABNORMAL LOW (ref 13.0–17.0)
Hemoglobin: 11.6 g/dL — ABNORMAL LOW (ref 13.0–17.0)
MCHC: 35.8 g/dL (ref 30.0–36.0)
Platelets: 108 10*3/uL — ABNORMAL LOW (ref 150–400)
RBC: 4.17 MIL/uL — ABNORMAL LOW (ref 4.22–5.81)
RBC: 3.66 MIL/uL — ABNORMAL LOW (ref 4.22–5.81)
RDW: 12.4 % (ref 11.5–15.5)
WBC: 19.6 10*3/uL — ABNORMAL HIGH (ref 4.0–10.5)
WBC: 12.9 10*3/uL — ABNORMAL HIGH (ref 4.0–10.5)
WBC: 16.9 10*3/uL — ABNORMAL HIGH (ref 4.0–10.5)

## 2012-02-11 LAB — POCT I-STAT 4, (NA,K, GLUC, HGB,HCT)
Glucose, Bld: 120 mg/dL — ABNORMAL HIGH (ref 70–99)
Glucose, Bld: 123 mg/dL — ABNORMAL HIGH (ref 70–99)
Glucose, Bld: 75 mg/dL (ref 70–99)
HCT: 41 % (ref 39.0–52.0)
HCT: 34 % — ABNORMAL LOW (ref 39.0–52.0)
HCT: 37 % — ABNORMAL LOW (ref 39.0–52.0)
HCT: 39 % (ref 39.0–52.0)
Hemoglobin: 11.2 g/dL — ABNORMAL LOW (ref 13.0–17.0)
Hemoglobin: 11.6 g/dL — ABNORMAL LOW (ref 13.0–17.0)
Hemoglobin: 13.3 g/dL (ref 13.0–17.0)
Hemoglobin: 13.9 g/dL (ref 13.0–17.0)
Hemoglobin: 15.3 g/dL (ref 13.0–17.0)
Potassium: 3.8 mEq/L (ref 3.5–5.1)
Potassium: 3.9 mEq/L (ref 3.5–5.1)
Potassium: 3.9 mEq/L (ref 3.5–5.1)
Potassium: 4.1 mEq/L (ref 3.5–5.1)
Sodium: 141 mEq/L (ref 135–145)
Sodium: 140 mEq/L (ref 135–145)
Sodium: 140 mEq/L (ref 135–145)
Sodium: 142 mEq/L (ref 135–145)

## 2012-02-11 LAB — PREPARE RBC (CROSSMATCH)

## 2012-02-11 LAB — POCT I-STAT, CHEM 8
BUN: 13 mg/dL (ref 6–23)
Calcium, Ion: 1.11 mmol/L — ABNORMAL LOW (ref 1.13–1.30)
Chloride: 108 mEq/L (ref 96–112)
HCT: 32 % — ABNORMAL LOW (ref 39.0–52.0)
Potassium: 4.1 mEq/L (ref 3.5–5.1)
Sodium: 143 mEq/L (ref 135–145)

## 2012-02-11 LAB — POCT I-STAT GLUCOSE: Glucose, Bld: 86 mg/dL (ref 70–99)

## 2012-02-11 LAB — BASIC METABOLIC PANEL
CO2: 19 mEq/L (ref 19–32)
Glucose, Bld: 177 mg/dL — ABNORMAL HIGH (ref 70–99)
Potassium: 4.2 mEq/L (ref 3.5–5.1)
Sodium: 138 mEq/L (ref 135–145)

## 2012-02-11 LAB — GLUCOSE, CAPILLARY
Glucose-Capillary: 115 mg/dL — ABNORMAL HIGH (ref 70–99)
Glucose-Capillary: 110 mg/dL — ABNORMAL HIGH (ref 70–99)
Glucose-Capillary: 125 mg/dL — ABNORMAL HIGH (ref 70–99)

## 2012-02-11 LAB — CREATININE, SERUM
Creatinine, Ser: 0.97 mg/dL (ref 0.50–1.35)
GFR calc Af Amer: >90 mL/min (ref >90)
GFR calc non Af Amer: 81 mL/min — ABNORMAL LOW (ref >90)

## 2012-02-11 LAB — MAGNESIUM
Magnesium: 2.8 mg/dL — ABNORMAL HIGH (ref 1.5–2.5)
Magnesium: 2.4 mg/dL (ref 1.5–2.5)

## 2012-02-11 LAB — APTT: aPTT: 36 seconds (ref 24–37)

## 2012-02-11 LAB — PROTIME-INR: INR: 1.4 (ref 0.00–1.49)

## 2012-02-11 MED ORDER — ACETAMINOPHEN 10 MG/ML IV SOLN
1000.0000 mg | Freq: Once | INTRAVENOUS | Status: AC
  Administered 2012-02-11: 1000 mg via INTRAVENOUS

## 2012-02-11 MED ORDER — SODIUM CHLORIDE 0.9 % IJ SOLN: 3.0000 mL | INTRAMUSCULAR | Status: DC | PRN

## 2012-02-11 MED ORDER — LACTATED RINGERS IV SOLN: 500.0000 mL | Freq: Once | INTRAVENOUS | Status: DC | PRN

## 2012-02-11 MED ORDER — METOPROLOL TARTRATE 25 MG/10 ML ORAL SUSPENSION
12.5000 mg | Freq: Two times a day (BID) | ORAL | Status: DC
  Filled 2012-02-11 (×4): qty 5

## 2012-02-11 MED ORDER — ACETAMINOPHEN 160 MG/5ML PO SOLN: 975.0000 mg | Freq: Four times a day (QID) | ORAL | Status: DC

## 2012-02-11 MED ORDER — DEXTROSE 5 % IV SOLN
1.5000 g | Freq: Two times a day (BID) | INTRAVENOUS | Status: DC
  Administered 2012-02-11 – 2012-02-12 (×3): 1.5 g via INTRAVENOUS
  Filled 2012-02-11 (×4): qty 1.5

## 2012-02-11 MED ORDER — SODIUM CHLORIDE 0.9 % IV SOLN
INTRAVENOUS | Status: DC
  Administered 2012-02-11: 10 mL/h via INTRAVENOUS

## 2012-02-11 MED ORDER — PANTOPRAZOLE SODIUM 40 MG PO TBEC
40.0000 mg | DELAYED_RELEASE_TABLET | Freq: Every day | ORAL | Status: DC
  Administered 2012-02-12 – 2012-02-15 (×4): 40 mg via ORAL
  Filled 2012-02-11 (×4): qty 1

## 2012-02-11 MED ORDER — ASPIRIN 81 MG PO CHEW: 324.0000 mg | CHEWABLE_TABLET | Freq: Every day | ORAL | Status: DC

## 2012-02-11 MED ORDER — MORPHINE SULFATE 2 MG/ML IJ SOLN
1.0000 mg | INTRAMUSCULAR | Status: AC | PRN
  Administered 2012-02-11 (×3): 2 mg via INTRAVENOUS

## 2012-02-11 MED ORDER — SODIUM CHLORIDE 0.45 % IV SOLN
INTRAVENOUS | Status: DC
  Administered 2012-02-11: 20 mL/h via INTRAVENOUS

## 2012-02-11 MED ORDER — SODIUM CHLORIDE 0.9 % IV SOLN: INTRAVENOUS | Status: DC

## 2012-02-11 MED ORDER — INSULIN ASPART 100 UNIT/ML ~~LOC~~ SOLN: 0.0000 [IU] | SUBCUTANEOUS | Status: DC

## 2012-02-11 MED ORDER — DOPAMINE-DEXTROSE 3.2-5 MG/ML-% IV SOLN
3.0000 ug/kg/min | INTRAVENOUS | Status: DC
  Administered 2012-02-11: 2 ug/kg/min via INTRAVENOUS

## 2012-02-11 MED ORDER — PHENYLEPHRINE HCL 10 MG/ML IJ SOLN: 0.0000 ug/min | INTRAVENOUS | Status: DC

## 2012-02-11 MED ORDER — MAGNESIUM SULFATE 40 MG/ML IJ SOLN
INTRAMUSCULAR | Status: AC
  Administered 2012-02-11: 4 g via INTRAVENOUS
  Filled 2012-02-11: qty 100

## 2012-02-11 MED ORDER — ALBUMIN HUMAN 5 % IV SOLN
250.0000 mL | INTRAVENOUS | Status: AC | PRN
  Filled 2012-02-11 (×2): qty 250

## 2012-02-11 MED ORDER — LACTATED RINGERS IV SOLN
INTRAVENOUS | Status: DC
  Administered 2012-02-11: 20 mL/h via INTRAVENOUS

## 2012-02-11 MED ORDER — VANCOMYCIN HCL IN DEXTROSE 1-5 GM/200ML-% IV SOLN
1000.0000 mg | Freq: Once | INTRAVENOUS | Status: AC
  Administered 2012-02-11: 1000 mg via INTRAVENOUS
  Filled 2012-02-11: qty 200

## 2012-02-11 MED ORDER — TRAMADOL HCL 50 MG PO TABS
50.0000 mg | ORAL_TABLET | Freq: Four times a day (QID) | ORAL | Status: DC | PRN
  Administered 2012-02-12 – 2012-02-13 (×3): 50 mg via ORAL
  Filled 2012-02-11 (×3): qty 1

## 2012-02-11 MED ORDER — INSULIN REGULAR BOLUS VIA INFUSION
0.0000 [IU] | Freq: Three times a day (TID) | INTRAVENOUS | Status: DC
Start: 2012-02-11 — End: 2012-02-12
  Filled 2012-02-11: qty 10

## 2012-02-11 MED ORDER — NITROGLYCERIN IN D5W 200-5 MCG/ML-% IV SOLN
0.0000 ug/min | INTRAVENOUS | Status: DC
  Administered 2012-02-11: 5 ug/min via INTRAVENOUS

## 2012-02-11 MED ORDER — OXYCODONE HCL 5 MG PO TABS
5.0000 mg | ORAL_TABLET | ORAL | Status: DC | PRN
  Administered 2012-02-11 (×3): 5 mg via ORAL
  Filled 2012-02-11 (×3): qty 1

## 2012-02-11 MED ORDER — POTASSIUM CHLORIDE 10 MEQ/50ML IV SOLN
10.0000 meq | INTRAVENOUS | Status: AC
  Administered 2012-02-11 (×3): 10 meq via INTRAVENOUS

## 2012-02-11 MED ORDER — METOCLOPRAMIDE HCL 5 MG/ML IJ SOLN
10.0000 mg | Freq: Four times a day (QID) | INTRAMUSCULAR | Status: AC
  Administered 2012-02-11 – 2012-02-12 (×4): 10 mg via INTRAVENOUS
  Filled 2012-02-11 (×4): qty 2

## 2012-02-11 MED ORDER — MORPHINE SULFATE 2 MG/ML IJ SOLN
2.0000 mg | INTRAMUSCULAR | Status: DC | PRN
  Administered 2012-02-11: 4 mg via INTRAVENOUS
  Administered 2012-02-12: 2 mg via INTRAVENOUS
  Filled 2012-02-11 (×2): qty 2
  Filled 2012-02-11 (×2): qty 1

## 2012-02-11 MED ORDER — MIDAZOLAM HCL 2 MG/2ML IJ SOLN
2.0000 mg | INTRAMUSCULAR | Status: DC | PRN
  Administered 2012-02-11 (×2): 2 mg via INTRAVENOUS
  Filled 2012-02-11 (×3): qty 2

## 2012-02-11 MED ORDER — ASPIRIN EC 325 MG PO TBEC
325.0000 mg | DELAYED_RELEASE_TABLET | Freq: Every day | ORAL | Status: DC
  Administered 2012-02-12 – 2012-02-15 (×4): 325 mg via ORAL
  Filled 2012-02-11 (×5): qty 1

## 2012-02-11 MED ORDER — FAMOTIDINE IN NACL 20-0.9 MG/50ML-% IV SOLN
20.0000 mg | Freq: Two times a day (BID) | INTRAVENOUS | Status: AC
  Administered 2012-02-11 (×2): 20 mg via INTRAVENOUS
  Filled 2012-02-11 (×2): qty 50

## 2012-02-11 MED ORDER — SODIUM CHLORIDE 0.9 % IJ SOLN
3.0000 mL | Freq: Two times a day (BID) | INTRAMUSCULAR | Status: DC
  Administered 2012-02-11 – 2012-02-12 (×3): 3 mL via INTRAVENOUS

## 2012-02-11 MED ORDER — MILRINONE IN DEXTROSE 20 MG/100ML IV SOLN: 0.3000 ug/kg/min | INTRAVENOUS | Status: DC

## 2012-02-11 MED ORDER — MAGNESIUM SULFATE 40 MG/ML IJ SOLN
4.0000 g | Freq: Once | INTRAMUSCULAR | Status: AC
  Administered 2012-02-11: 4 g via INTRAVENOUS

## 2012-02-11 MED ORDER — METOPROLOL TARTRATE 1 MG/ML IV SOLN
2.5000 mg | INTRAVENOUS | Status: DC | PRN
  Administered 2012-02-13: 5 mg via INTRAVENOUS
  Administered 2012-02-14: 2.5 mg via INTRAVENOUS
  Filled 2012-02-11 (×2): qty 5

## 2012-02-11 MED ORDER — DEXMEDETOMIDINE HCL IN NACL 200 MCG/50ML IV SOLN
0.1000 ug/kg/h | INTRAVENOUS | Status: DC
  Administered 2012-02-11 (×2): 0.7 ug/kg/h via INTRAVENOUS
  Filled 2012-02-11 (×2): qty 50

## 2012-02-11 MED ORDER — METOPROLOL TARTRATE 12.5 MG HALF TABLET
12.5000 mg | ORAL_TABLET | Freq: Two times a day (BID) | ORAL | Status: DC
  Administered 2012-02-11 – 2012-02-13 (×5): 12.5 mg via ORAL
  Filled 2012-02-11 (×8): qty 1

## 2012-02-11 MED ORDER — BISACODYL 5 MG PO TBEC
10.0000 mg | DELAYED_RELEASE_TABLET | Freq: Every day | ORAL | Status: DC
  Administered 2012-02-12 – 2012-02-13 (×2): 10 mg via ORAL
  Filled 2012-02-11 (×2): qty 2

## 2012-02-11 MED ORDER — DIPHENHYDRAMINE HCL 50 MG/ML IJ SOLN
INTRAMUSCULAR | Status: AC
  Administered 2012-02-11: 12.5 mg via INTRAVENOUS
  Filled 2012-02-11: qty 1

## 2012-02-11 MED ORDER — ISOSORBIDE MONONITRATE ER 30 MG PO TB24
30.0000 mg | ORAL_TABLET | Freq: Every day | ORAL | Status: DC
  Administered 2012-02-11 – 2012-02-15 (×5): 30 mg via ORAL
  Filled 2012-02-11 (×5): qty 1

## 2012-02-11 MED ORDER — SODIUM CHLORIDE 0.9 % IV SOLN: 250.0000 mL | INTRAVENOUS | Status: DC | PRN

## 2012-02-11 MED ORDER — DIPHENHYDRAMINE HCL 50 MG/ML IJ SOLN
12.5000 mg | Freq: Four times a day (QID) | INTRAMUSCULAR | Status: DC | PRN
  Administered 2012-02-11: 12.5 mg via INTRAVENOUS

## 2012-02-11 MED ORDER — ACETAMINOPHEN 500 MG PO TABS
1000.0000 mg | ORAL_TABLET | Freq: Four times a day (QID) | ORAL | Status: DC
  Administered 2012-02-11 – 2012-02-15 (×15): 1000 mg via ORAL
  Filled 2012-02-11 (×20): qty 2

## 2012-02-11 MED ORDER — INSULIN ASPART 100 UNIT/ML ~~LOC~~ SOLN
0.0000 [IU] | SUBCUTANEOUS | Status: AC
  Administered 2012-02-11: 4 [IU] via SUBCUTANEOUS

## 2012-02-11 MED ORDER — BISACODYL 10 MG RE SUPP: 10.0000 mg | Freq: Every day | RECTAL | Status: DC

## 2012-02-11 MED ORDER — ONDANSETRON HCL 4 MG/2ML IJ SOLN
4.0000 mg | Freq: Four times a day (QID) | INTRAMUSCULAR | Status: DC | PRN
  Administered 2012-02-11: 4 mg via INTRAVENOUS
  Filled 2012-02-11: qty 2

## 2012-02-11 MED ORDER — INSULIN ASPART 100 UNIT/ML ~~LOC~~ SOLN
0.0000 [IU] | SUBCUTANEOUS | Status: DC
  Administered 2012-02-11 – 2012-02-12 (×3): 2 [IU] via SUBCUTANEOUS

## 2012-02-11 MED ORDER — DOCUSATE SODIUM 100 MG PO CAPS
200.0000 mg | ORAL_CAPSULE | Freq: Every day | ORAL | Status: DC
  Administered 2012-02-12 – 2012-02-14 (×3): 200 mg via ORAL
  Filled 2012-02-11 (×4): qty 2

## 2012-02-11 NOTE — Transfer of Care (Signed)
Immediate Anesthesia Transfer of Care Note  Patient: [name] [name]  Procedure(s) Performed: Procedure(s) (LRB) with comments: CORONARY ARTERY BYPASS GRAFTING (CABG) (N/A) - CABG x three, using Bilateral mammary artery harvest and left arm radial harvest INTRAOPERATIVE TRANSESOPHAGEAL ECHOCARDIOGRAM (N/A) RADIAL ARTERY HARVEST (Left)  Patient Location: SICU  Anesthesia Type:General  Level of Consciousness: Patient remains intubated per anesthesia plan  Airway & Oxygen Therapy: Patient remains intubated per anesthesia plan  Post-op Assessment: Report given to PACU RN and Post -op Vital signs reviewed and stable  Post vital signs: Reviewed and stable  Complications: No apparent anesthesia complications

## 2012-02-11 NOTE — Anesthesia Postprocedure Evaluation (Signed)
Anesthesia Post Note  Patient: [name] [name]  Procedure(s) Performed: Procedure(s) (LRB): CORONARY ARTERY BYPASS GRAFTING (CABG) (N/A) INTRAOPERATIVE TRANSESOPHAGEAL ECHOCARDIOGRAM (N/A) RADIAL ARTERY HARVEST (Left)  Anesthesia type: General  Patient location: ICU  Post pain: Pain level controlled  Post assessment: Post-op Vital signs reviewed  Last Vitals:  Filed Vitals:   02/11/12 0123  BP:   Pulse: 60  Temp:   Resp: 18    Post vital signs: stable  Level of consciousness: Patient remains intubated per anesthesia plan  Complications: No apparent anesthesia complications

## 2012-02-11 NOTE — Progress Notes (Signed)
1 Day Post-Op Procedure(s) (LRB): CORONARY ARTERY BYPASS GRAFTING (CABG) (N/A) INTRAOPERATIVE TRANSESOPHAGEAL ECHOCARDIOGRAM (N/A) RADIAL ARTERY HARVEST (Left) Subjec Status post emergency CABG x3 for 99% left main with all arterial grafts because of prior bilateral vein stripping Extubated with stable hemodynamics, left arm and hand warm with good grip Objective: Vital signs in last 24 hours: Temp:  [95.7 F (35.4 C)-99.9 F (37.7 C)] 99.2 F (37.3 C) (01/25 1601) Pulse Rate:  [60-90] 90  (01/25 1400) Cardiac Rhythm:  [-] Atrial paced (01/25 0800) Resp:  [12-29] 19  (01/25 1400) BP: (83-101)/(34-74) 100/57 mmHg (01/25 1100) SpO2:  [97 %-100 %] 99 % (01/25 1400) Arterial Line BP: (86-139)/(46-61) 123/51 mmHg (01/25 1400) FiO2 (%):  [39.8 %-50.9 %] 40 % (01/25 1100)  Hemodynamic parameters for last 24 hours: PAP: (20-29)/(8-17) 25/12 mmHg CO:  [4.5 L/min-5.8 L/min] 4.7 L/min CI:  [2.2 L/min/m2-2.8 L/min/m2] 2.3 L/min/m2  Intake/Output from previous day: 01/24 0701 - 01/25 0700 In: 4659.5 [I.V.:2645.5; IV Piggyback:2014] Out: 4165 [Urine:2735; Blood:1100; Chest Tube:330] Intake/Output this shift: Total I/O In: 501.8 [P.O.:75; I.V.:376.8; IV Piggyback:50] Out: 675 [Urine:525; Chest Tube:150]  Alert responsive, Thursday Lungs clear Extremities warm Neuro intact  Lab Results:  Basename 02/11/12 0630 02/11/12 0151  WBC 12.9* 19.6*  HGB 11.9* 14.0  HCT 34.0* 39.1  PLT 108* 123*   BMET:  Basename 02/11/12 0630 02/11/12 0128  NA 138 142  K 4.2 3.9  CL 106 --  CO2 19 --  GLUCOSE 177* 123*  BUN 10 --  CREATININE 0.77 --  CALCIUM 7.4* --    PT/INR:  Basename 02/11/12 0151  LABPROT 16.8*  INR 1.40   ABG    Component Value Date/Time   PHART 7.387 02/11/2012 1236   HCO3 22.2 02/11/2012 1236   TCO2 23 02/11/2012 1236   ACIDBASEDEF 2.0 02/11/2012 1236   O2SAT 98.0 02/11/2012 1236   CBG (last 3)   Basename 02/11/12 1557 02/11/12 1219  GLUCAP 110* 115*     Assessment/Plan: S/P Procedure(s) (LRB): CORONARY ARTERY BYPASS GRAFTING (CABG) (N/A) INTRAOPERATIVE TRANSESOPHAGEAL ECHOCARDIOGRAM (N/A) RADIAL ARTERY HARVEST (Left) See progression orders   LOS: 1 day    VAN TRIGT III,Avrey Flanagin 02/11/2012

## 2012-02-11 NOTE — Op Note (Signed)
[name] [name], [name] [name] NO.:  [id number]  MEDICAL RECORD NO.:  [id number]  LOCATION:  2310                         FACILITY:  [facility]  PHYSICIAN:  Kerin Perna, M.D.  DATE OF BIRTH:  1940-03-26  DATE OF PROCEDURE:  02/10/2012 DATE OF DISCHARGE:                              OPERATIVE REPORT   OPERATION: 1. Emergency coronary artery bypass grafting x3 (left internal mammary     artery to LAD, right internal mammary artery to RCA, left radial     artery free graft to circumflex marginal). 2. Attempted but unsuccessful endoscopic harvest of left leg saphenous     vein, which had been treated with laser ablation. 3. Placement of right femoral A-line for blood pressure monitoring and     possible intraaortic balloon pump.  INDICATIONS:  The patient is a 72 year old Caucasian male, who presents with symptoms of unstable angina.  Cardiac catheterization demonstrated 99% left main stenosis with a proximal ostial 90% RCA stenosis.  Overall LV function was fairly well preserved.  He was referred for emergent surgical coronary revascularization.  Prior to surgery, I examined the patient in the cath lab step-down unit and discussed the results of the cardiac catheterization with the patient and family.  I discussed coronary artery bypass grafting for treatment of his severe left main and 3-vessel coronary artery disease. I reviewed this situation with conduit since he has had previous bilateral greater saphenous vein laser ablation therapy.  I told the patient we would plan on using bilateral IMA grafts and probable left radial artery as the left radial artery assessment showed an intact palmar arch.  I discussed with the patient and wife the risks of operation including risks of MI, bleeding, blood transfusion requirement, stroke, infection, and death.  They demonstrated their understanding and agreed to proceed with surgery under what I felt was an informed  consent.  OPERATIVE FINDINGS: 1. Good quality arterial grafts. 2. No usable greater saphenous vein in the leg. 3. Minimal myocardial scarring or fibrosis from ischemic damage. 4. Heavily diseased right coronary but graftable, distal branches of     the right coronary too small to graft.  OPERATIVE PROCEDURE:  The patient was brought to the operating room, placed supine on the operating table where general anesthesia was induced under invasive hemodynamic monitoring.  The transesophageal echo probe was placed by the anesthesiologist.  The patient was prepped and draped as a sterile field.  A proper time-out was performed.  Left arm was also prepped and draped as a sterile field.  An incision was made at the left knee and attempted endoscopic harvest of the vein was performed, however, this was unsuccessful as the vein showed evidence of blood obliteration from laser therapy.  An incision was made in the left forearm and the radial artery was exposed and harvested using the Harmonic scalpel.  Prior to removing the vessel, the patient was heparinized and the radial artery was clamped and it was documented to the palm arch.  Blood supply was intact with a strong pulse distal to the clamp in the radial artery.  The radial artery was cannulated and perfused with a verapamil, nitroglycerin solution and kept on  the back sterile table.  A sternal incision was then made.  Bilateral IMA grafts were harvested as pedicle grafts.  Each had good flow and measured approximately 1.5 mm.  The sternal retractor was placed and pericardium was opened and suspended.  Pursestrings were placed in the ascending aorta and right atrium.  The patient was cannulated and placed on cardiopulmonary bypass.  The coronary arteries were identified for grafting.  The right coronary had diffuse disease but was graftable at the mid vessel beyond the ostial stenosis.  The distal branches of the right coronary  were small vessels and too small to graft.  Cardioplegic cannulas were placed both antegrade and retrograde cold blood cardioplegia and the patient was cooled to 32 degrees.  The crossclamp was placed on the ascending aorta and 1 L of cold blood cardioplegia was delivered in split doses between the antegrade aortic and retrograde coronary sinus catheters. There was mild aortic insufficiency noted.  Good cardioplegic arrest was achieved with septal temperature less than 12 degrees.  Cardioplegia was delivered every 20 minutes.  The distal coronary anastomoses were then performed.  First, the radial artery graft was sewn to the circumflex marginal.  This was a large 1.75 mm vessel and the vessel was sewn end-to-side with running 8-0 Prolene with good flow through the graft.  The second distal anastomosis was in mid right coronary.  This was heavily diseased, but there was an area of soft vessel wall.  The right mammary pedicle was brought through an opening in the lateral pericardium and was brought down onto the right coronary and sewn end-to-side with running 8-0 Prolene.  There was good flow through the anastomosis after briefly releasing the pedicle bulldog, which was then reapplied.  The pedicle was secured to epicardium.  After cardioplegia was redosed, the third distal anastomosis was to the mid LAD.  The left IMA pedicle graft was brought through an opening  and the left lateral pericardium was brought down onto the LAD and sewn end- to-side with running 8-0 Prolene.  There was good flow through this anastomosis after briefly releasing the pedicle bulldog.  The bulldog was reapplied and the pedicle secured to the epicardium.  Cardioplegia was redosed.  While the crossclamp was still in place, the proximal radial artery anastomosis was placed on the ascending aorta using a 4.0 mm punch and running 7-0 Prolene.  Prior to tying down the proximal anastomosis, air was vented from  the coronaries with a dose of retrograde warm blood cardioplegia.  The crossclamp was removed.  The heart resumed a spontaneous rhythm.  The patient was rewarmed to 37 degrees.  Low-dose dopamine was started.  The ventilator was resumed.  It should be noted that the right pleural space was completely obliterated with adhesions and the left lower pleural space was obliterated with adhesions.  The patient was weaned off bypass without difficulty.  Echo showed good global LV function.  Blood pressure and hemodynamics were stable. Protamine was administered without adverse reaction.  The cannulas were removed.  The patient remained stable.  The superior pericardial fat was closed over the aorta.  Bilateral pleural tubes and an anterior mediastinal chest tube were placed and brought out through separate incisions.  The sternum was closed with interrupted steel wire.  The pectoralis fascia was closed with a running #1 Vicryl.  Subcutaneous and skin layers were closed in running Vicryl and sterile dressings were applied.  Total cardiopulmonary bypass time was 140 minutes.     Theron Arista  Donata Clay, M.D.     PV/MEDQ  D:  02/11/2012  T:  02/11/2012  Job:  [id number]  cc:   Verne Carrow, MD

## 2012-02-11 NOTE — Procedures (Signed)
Extubation Procedure Note  Patient Details:   Name: [name] [name] DOB: [birth year] MRN: [id number]   Airway Documentation:   PT extubated to 2L [place] per protocol. NIF -40, VC 1.2L. Pt able to vocalize. Pos cuff leak.  Evaluation  O2 sats: stable throughout and currently acceptable Complications: No apparent complications Patient did tolerate procedure well. Bilateral Breath Sounds: Clear;Diminished     Christie Beckers 02/11/2012, 11:39 AM

## 2012-02-11 NOTE — Progress Notes (Signed)
Attempted to begin wean pt turned down to SIMV 4 and 40%, pt having periods of apnea, SIMV increased back to 12. Will reassess in about 30 minutes. RT will continue to monitor.

## 2012-02-11 NOTE — OR Nursing (Signed)
00;35am - 2nd call to SICU

## 2012-02-11 NOTE — OR Nursing (Signed)
23:57pm - called 1st call to SICU, also informed unit of femoral line

## 2012-02-12 ENCOUNTER — Encounter (HOSPITAL_COMMUNITY): Payer: Self-pay | Admitting: Family Medicine

## 2012-02-12 ENCOUNTER — Inpatient Hospital Stay (HOSPITAL_COMMUNITY): Payer: Medicare Other

## 2012-02-12 LAB — GLUCOSE, CAPILLARY
Glucose-Capillary: 118 mg/dL — ABNORMAL HIGH (ref 70–99)
Glucose-Capillary: 113 mg/dL — ABNORMAL HIGH (ref 70–99)
Glucose-Capillary: 108 mg/dL — ABNORMAL HIGH (ref 70–99)
Glucose-Capillary: 163 mg/dL — ABNORMAL HIGH (ref 70–99)

## 2012-02-12 LAB — CBC
HCT: 30.7 % — ABNORMAL LOW (ref 39.0–52.0)
Hemoglobin: 10.9 g/dL — ABNORMAL LOW (ref 13.0–17.0)
MCH: 33.1 pg (ref 26.0–34.0)
MCHC: 35.5 g/dL (ref 30.0–36.0)
MCV: 93.3 fL (ref 78.0–100.0)
Platelets: 110 10*3/uL — ABNORMAL LOW (ref 150–400)
RBC: 3.29 MIL/uL — ABNORMAL LOW (ref 4.22–5.81)
RDW: 12.5 % (ref 11.5–15.5)
WBC: 15.8 10*3/uL — ABNORMAL HIGH (ref 4.0–10.5)

## 2012-02-12 LAB — BASIC METABOLIC PANEL
BUN: 17 mg/dL (ref 6–23)
CO2: 24 mEq/L (ref 19–32)
Calcium: 7.9 mg/dL — ABNORMAL LOW (ref 8.4–10.5)
Chloride: 103 mEq/L (ref 96–112)
Creatinine, Ser: 1.1 mg/dL (ref 0.50–1.35)
GFR calc Af Amer: 76 mL/min — ABNORMAL LOW (ref >90)
GFR calc non Af Amer: 66 mL/min — ABNORMAL LOW (ref >90)
Glucose, Bld: 125 mg/dL — ABNORMAL HIGH (ref 70–99)
Potassium: 3.9 mEq/L (ref 3.5–5.1)
Sodium: 135 mEq/L (ref 135–145)

## 2012-02-12 MED ORDER — BISACODYL 5 MG PO TBEC: 10.0000 mg | DELAYED_RELEASE_TABLET | Freq: Every day | ORAL | Status: DC | PRN

## 2012-02-12 MED ORDER — FUROSEMIDE 40 MG PO TABS
40.0000 mg | ORAL_TABLET | Freq: Every day | ORAL | Status: DC
  Administered 2012-02-13 – 2012-02-15 (×3): 40 mg via ORAL
  Filled 2012-02-12 (×3): qty 1

## 2012-02-12 MED ORDER — DEXTROSE 5 % IV SOLN
1.0000 g | Freq: Three times a day (TID) | INTRAVENOUS | Status: DC
  Filled 2012-02-12: qty 1

## 2012-02-12 MED ORDER — FUROSEMIDE 10 MG/ML IJ SOLN
40.0000 mg | Freq: Once | INTRAMUSCULAR | Status: AC
  Administered 2012-02-12: 40 mg via INTRAVENOUS

## 2012-02-12 MED ORDER — GUAIFENESIN ER 600 MG PO TB12
600.0000 mg | ORAL_TABLET | Freq: Two times a day (BID) | ORAL | Status: DC | PRN
  Administered 2012-02-12: 600 mg via ORAL
  Filled 2012-02-12: qty 1

## 2012-02-12 MED ORDER — DOCUSATE SODIUM 100 MG PO CAPS: 200.0000 mg | ORAL_CAPSULE | Freq: Every day | ORAL | Status: DC

## 2012-02-12 MED ORDER — SODIUM CHLORIDE 0.9 % IV SOLN
250.0000 mL | INTRAVENOUS | Status: DC | PRN
  Administered 2012-02-13: 250 mL via INTRAVENOUS

## 2012-02-12 MED ORDER — CEFTAZIDIME 1 G IJ SOLR: 1.0000 g | Freq: Three times a day (TID) | INTRAMUSCULAR | Status: DC

## 2012-02-12 MED ORDER — SODIUM CHLORIDE 0.9 % IJ SOLN: 3.0000 mL | INTRAMUSCULAR | Status: DC | PRN

## 2012-02-12 MED ORDER — SODIUM CHLORIDE 0.9 % IJ SOLN
3.0000 mL | Freq: Two times a day (BID) | INTRAMUSCULAR | Status: DC
  Administered 2012-02-12 – 2012-02-15 (×5): 3 mL via INTRAVENOUS

## 2012-02-12 MED ORDER — MOVING RIGHT ALONG BOOK
Freq: Once | Status: AC
  Administered 2012-02-12: 18:00:00
  Filled 2012-02-12 (×2): qty 1

## 2012-02-12 MED ORDER — MAGNESIUM HYDROXIDE 400 MG/5ML PO SUSP: 30.0000 mL | Freq: Every day | ORAL | Status: DC | PRN

## 2012-02-12 MED ORDER — POTASSIUM CHLORIDE 10 MEQ/50ML IV SOLN
10.0000 meq | INTRAVENOUS | Status: AC
  Administered 2012-02-12 (×2): 10 meq via INTRAVENOUS

## 2012-02-12 MED ORDER — ALBUTEROL SULFATE HFA 108 (90 BASE) MCG/ACT IN AERS
2.0000 | INHALATION_SPRAY | Freq: Four times a day (QID) | RESPIRATORY_TRACT | Status: DC | PRN
  Filled 2012-02-12: qty 6.7

## 2012-02-12 MED ORDER — BISACODYL 10 MG RE SUPP: 10.0000 mg | Freq: Every day | RECTAL | Status: DC | PRN

## 2012-02-12 MED ORDER — POTASSIUM CHLORIDE CRYS ER 20 MEQ PO TBCR
20.0000 meq | EXTENDED_RELEASE_TABLET | Freq: Every day | ORAL | Status: DC
  Administered 2012-02-12 – 2012-02-15 (×4): 20 meq via ORAL
  Filled 2012-02-12 (×4): qty 1

## 2012-02-12 NOTE — Progress Notes (Signed)
2 Days Post-Op Procedure(s) (LRB): CORONARY ARTERY BYPASS GRAFTING (CABG) (N/A) INTRAOPERATIVE TRANSESOPHAGEAL ECHOCARDIOGRAM (N/A) RADIAL ARTERY HARVEST (Left) Subjective: Emergency CABG for 99% L main- all arterial grafts  Objective: Vital signs in last 24 hours: Temp:  [98.3 F (36.8 C)-100.4 F (38 C)] 98.5 F (36.9 C) (01/26 0757) Pulse Rate:  [77-109] 85  (01/26 0800) Cardiac Rhythm:  [-] Normal sinus rhythm (01/26 0800) Resp:  [14-29] 24  (01/26 0800) BP: (84-116)/(49-62) 116/62 mmHg (01/26 0800) SpO2:  [93 %-100 %] 95 % (01/26 0800) Arterial Line BP: (89-162)/(41-65) 126/56 mmHg (01/26 0600) FiO2 (%):  [39.8 %-40 %] 40 % (01/25 1100) Weight:  [191 lb 2.2 oz (86.7 kg)] 191 lb 2.2 oz (86.7 kg) (01/26 0500)  Hemodynamic parameters for last 24 hours: PAP: (23-40)/(10-22) 24/14 mmHg  Intake/Output from previous day: 01/25 0701 - 01/26 0700 In: 1676.3 [P.O.:795; I.V.:759.3; IV Piggyback:122] Out: 1575 [Urine:1225; Chest Tube:350] Intake/Output this shift: Total I/O In: 20 [I.V.:20] Out: 20 [Urine:20]  Incisions clean,dry L hand warm  Lab Results:  Basename 02/12/12 0329 02/11/12 1658 02/11/12 1645  WBC 15.8* -- 16.9*  HGB 10.9* 10.9* --  HCT 30.7* 32.0* --  PLT 110* -- 126*   BMET:  Basename 02/12/12 0329 02/11/12 1658 02/11/12 0630  NA 135 143 --  K 3.9 4.1 --  CL 103 108 --  CO2 24 -- 19  GLUCOSE 125* 135* --  BUN 17 13 --  CREATININE 1.10 1.00 --  CALCIUM 7.9* -- 7.4*    PT/INR:  Basename 02/11/12 0151  LABPROT 16.8*  INR 1.40   ABG    Component Value Date/Time   PHART 7.387 02/11/2012 1236   HCO3 22.2 02/11/2012 1236   TCO2 21 02/11/2012 1658   ACIDBASEDEF 2.0 02/11/2012 1236   O2SAT 98.0 02/11/2012 1236   CBG (last 3)   Basename 02/12/12 0755 02/11/12 2352 02/11/12 1956  GLUCAP 103* 116* 125*    Assessment/Plan: S/P Procedure(s) (LRB): CORONARY ARTERY BYPASS GRAFTING (CABG) (N/A) INTRAOPERATIVE TRANSESOPHAGEAL ECHOCARDIOGRAM  (N/A) RADIAL ARTERY HARVEST (Left) Plan for transfer to step-down: see transfer orders Cont antibiotics for inc WBC  LOS: 2 days    VAN TRIGT III,PETER 02/12/2012

## 2012-02-12 NOTE — Progress Notes (Signed)
Report called to Norwood Levo, RN on 2000.  Pt transferred via wheelchair to room 2016.  VSS.  Pt assisted into chair and left in care of Dina, RN and [place], [place].

## 2012-02-13 ENCOUNTER — Inpatient Hospital Stay (HOSPITAL_COMMUNITY): Payer: Medicare Other

## 2012-02-13 ENCOUNTER — Encounter (HOSPITAL_COMMUNITY): Payer: Self-pay | Admitting: Cardiothoracic Surgery

## 2012-02-13 LAB — BASIC METABOLIC PANEL
BUN: 20 mg/dL (ref 6–23)
CO2: 24 mEq/L (ref 19–32)
Calcium: 8.2 mg/dL — ABNORMAL LOW (ref 8.4–10.5)
Chloride: 102 mEq/L (ref 96–112)
Creatinine, Ser: 0.93 mg/dL (ref 0.50–1.35)
GFR calc Af Amer: >90 mL/min (ref >90)
GFR calc non Af Amer: 83 mL/min — ABNORMAL LOW (ref >90)
Glucose, Bld: 108 mg/dL — ABNORMAL HIGH (ref 70–99)
Potassium: 4 mEq/L (ref 3.5–5.1)
Sodium: 135 mEq/L (ref 135–145)

## 2012-02-13 LAB — TYPE AND SCREEN
ABO/RH(D): A POS
Antibody Screen: NEGATIVE
Unit division: 0
Unit division: 0

## 2012-02-13 LAB — CBC
HCT: 31.3 % — ABNORMAL LOW (ref 39.0–52.0)
Hemoglobin: 10.9 g/dL — ABNORMAL LOW (ref 13.0–17.0)
MCH: 32.8 pg (ref 26.0–34.0)
MCHC: 34.8 g/dL (ref 30.0–36.0)
MCV: 94.3 fL (ref 78.0–100.0)
Platelets: 113 10*3/uL — ABNORMAL LOW (ref 150–400)
RBC: 3.32 MIL/uL — ABNORMAL LOW (ref 4.22–5.81)
RDW: 12.7 % (ref 11.5–15.5)
WBC: 15 10*3/uL — ABNORMAL HIGH (ref 4.0–10.5)

## 2012-02-13 MED ORDER — AMIODARONE HCL IN DEXTROSE 360-4.14 MG/200ML-% IV SOLN
0.5000 mg/min | INTRAVENOUS | Status: DC
  Filled 2012-02-13 (×6): qty 200

## 2012-02-13 MED ORDER — AMIODARONE HCL 200 MG PO TABS
400.0000 mg | ORAL_TABLET | Freq: Two times a day (BID) | ORAL | Status: DC
  Administered 2012-02-13 – 2012-02-15 (×4): 400 mg via ORAL
  Filled 2012-02-13 (×5): qty 2

## 2012-02-13 MED ORDER — METOPROLOL TARTRATE 1 MG/ML IV SOLN
5.0000 mg | Freq: Once | INTRAVENOUS | Status: AC
  Administered 2012-02-13: 5 mg via INTRAVENOUS
  Filled 2012-02-13: qty 5

## 2012-02-13 MED ORDER — AMIODARONE HCL IN DEXTROSE 360-4.14 MG/200ML-% IV SOLN
1.0000 mg/min | INTRAVENOUS | Status: AC
  Administered 2012-02-13: 1 mg/min via INTRAVENOUS
  Filled 2012-02-13: qty 200

## 2012-02-13 MED FILL — Electrolyte-R (PH 7.4) Solution: INTRAVENOUS | Qty: 5000 | Status: AC

## 2012-02-13 MED FILL — Sodium Chloride Irrigation Soln 0.9%: Qty: 3000 | Status: AC

## 2012-02-13 MED FILL — Heparin Sodium (Porcine) Inj 1000 Unit/ML: INTRAMUSCULAR | Qty: 10 | Status: AC

## 2012-02-13 MED FILL — Mannitol IV Soln 20%: INTRAVENOUS | Qty: 500 | Status: AC

## 2012-02-13 MED FILL — Heparin Sodium (Porcine) Inj 1000 Unit/ML: INTRAMUSCULAR | Qty: 30 | Status: AC

## 2012-02-13 MED FILL — Lidocaine HCl IV Inj 20 MG/ML: INTRAVENOUS | Qty: 5 | Status: AC

## 2012-02-13 MED FILL — Sodium Chloride IV Soln 0.9%: INTRAVENOUS | Qty: 1000 | Status: AC

## 2012-02-13 MED FILL — Potassium Chloride Inj 2 mEq/ML: INTRAVENOUS | Qty: 40 | Status: AC

## 2012-02-13 MED FILL — Magnesium Sulfate Inj 50%: INTRAMUSCULAR | Qty: 10 | Status: AC

## 2012-02-13 MED FILL — Sodium Bicarbonate IV Soln 8.4%: INTRAVENOUS | Qty: 50 | Status: AC

## 2012-02-13 NOTE — Progress Notes (Signed)
Patient went to Afib. With HR ranging 115-160 At 04: 30 AM. The Afib was confirmed with EKG, it was Afib 122. Pts. BP was 121/87, O2 92 RA. Pt. Was not symptomatic. Dr. Donata Clay was notified and received to give Lopressor 5 mg iv one dose, and to start Amiodarone IV. Will continue monitoring.

## 2012-02-13 NOTE — Clinical Documentation Improvement (Signed)
Anemia Blood Loss Clarification  THIS DOCUMENT IS NOT A PERMANENT PART OF THE MEDICAL RECORD  RESPOND TO THE THIS QUERY, FOLLOW THE INSTRUCTIONS BELOW:  1. If needed, update documentation for the patient's encounter via the notes activity.  2. Access this query again and click edit on the In [employer].  3. After updating, or not, click F2 to complete all highlighted (required) fields concerning your review. Select "additional documentation in the medical record" OR "no additional documentation provided".  4. Click Sign note button.  5. The deficiency will fall out of your In Basket *Please let us know if you are not able to complete this workflow by phone or e-mail (listed below).        02/13/12  Dear Dr. Maren Beach Marton Redwood  In an effort to better capture your patient's severity of illness, reflect appropriate length of stay and utilization of resources, a review of the patient medical record has revealed the following indicators.    Based on your clinical judgment, please clarify and document in a progress note and/or discharge summary the clinical condition associated with the following supporting information:  In responding to this query please exercise your independent judgment.  The fact that a query is asked, does not imply that any particular answer is desired or expected.   Possible Clinical Conditions?   " Expected Acute Blood Loss Anemia  " Acute Blood Loss Anemia  " Acute on chronic blood loss anemia  " Precipitous drop in Hematocrit  " Other Condition  " Cannot Clinically Determine     Signs and Symptoms: EBL: per 01/24 Anesthesia record.  Diagnostics: H&H on 01/24:  15.3/45.0 H&H on 01/25:  10.9/32.0  Treatments: IV fluids / plasma expanders: Albumin, human 5%: per 01/24 Anesthesia record. Serial H&H monitoring Medications (Fe, Procrit)  Reviewed:  ABLA documented per 01/28 progress notes.                                   Thank  You,  Marciano Sequin,  Clinical Documentation Specialist:  Pager: [phone]  Phone: [phone]  Health Information Management [place]

## 2012-02-13 NOTE — Progress Notes (Signed)
CARDIAC REHAB PHASE I   PRE:  Rate/Rhythm: 77SR  BP:  Supine: 106/70  Sitting:   Standing:    SaO2: 98%2L  MODE:  Ambulation: 550 ft   POST:  Rate/Rhythem: 88SR  BP:  Supine:   Sitting: 135/80  Standing:    SaO2: 95%RA  0840-0925 Pt walked 550 ft on RA with rolling [place] and minimal asst. Tolerated  Well on RA. Pt did not want to sit in recliner. Likes straight back chair. Put feet up in another chair. Encouraged pt to call for asst when he needs to get up. Left off oxygen. Encouraged IS. Pt brushed teeth at sink after walk. Pt motivated to walk.  Duanne Limerick

## 2012-02-13 NOTE — Plan of Care (Signed)
Problem: Phase III Progression Outcomes Goal: Time patient transferred to PCTU/Telemetry POD Outcome: Completed/Met Date Met:  02/12/12 1600

## 2012-02-13 NOTE — Progress Notes (Deleted)
Pt ambulated 150 ft with RN, family members, and rolling [place].

## 2012-02-13 NOTE — Progress Notes (Addendum)
3 Days Post-Op Procedure(s) (LRB): CORONARY ARTERY BYPASS GRAFTING (CABG) (N/A) INTRAOPERATIVE TRANSESOPHAGEAL ECHOCARDIOGRAM (N/A) RADIAL ARTERY HARVEST (Left) Subjective:  [name] [name] developed rapid Atrial Fibrillation early this morning.  Currently NSR.  He is ambulating, No BM + Flatus  Objective: Vital signs in last 24 hours: Temp:  [97.4 F (36.3 C)-99.7 F (37.6 C)] 98.8 F (37.1 C) (01/27 0444) Pulse Rate:  [77-120] 79  (01/27 1610) Cardiac Rhythm:  [-] Normal sinus rhythm (01/26 1945) Resp:  [18-32] 21  (01/27 0444) BP: (103-121)/(56-87) 105/67 mmHg (01/27 0608) SpO2:  [92 %-99 %] 99 % (01/27 0608) Weight:  [193 lb 12.8 oz (87.907 kg)] 193 lb 12.8 oz (87.907 kg) (01/27 0444)   Intake/Output from previous day: 01/26 0701 - 01/27 0700 In: 774 [P.O.:600; I.V.:20; IV Piggyback:154] Out: 1350 [Urine:1300; Chest Tube:50]  General appearance: alert, cooperative and no distress Heart: regular rate and rhythm Lungs: clear to auscultation bilaterally Abdomen: soft, non-tender; bowel sounds normal; no masses,  no organomegaly Extremities: edema trace Wound: clean and dry  Lab Results:  Basename 02/13/12 0500 02/12/12 0329  WBC 15.0* 15.8*  HGB 10.9* 10.9*  HCT 31.3* 30.7*  PLT 113* 110*   BMET:  Basename 02/13/12 0500 02/12/12 0329  NA 135 135  K 4.0 3.9  CL 102 103  CO2 24 24  GLUCOSE 108* 125*  BUN 20 17  CREATININE 0.93 1.10  CALCIUM 8.2* 7.9*    PT/INR:  Basename 02/11/12 0151  LABPROT 16.8*  INR 1.40   ABG    Component Value Date/Time   PHART 7.387 02/11/2012 1236   HCO3 22.2 02/11/2012 1236   TCO2 21 02/11/2012 1658   ACIDBASEDEF 2.0 02/11/2012 1236   O2SAT 98.0 02/11/2012 1236   CBG (last 3)   Basename 02/12/12 1615 02/12/12 1154 02/12/12 0755  GLUCAP 109* 129* 103*    Assessment/Plan: S/P Procedure(s) (LRB): CORONARY ARTERY BYPASS GRAFTING (CABG) (N/A) INTRAOPERATIVE TRANSESOPHAGEAL ECHOCARDIOGRAM (N/A) RADIAL ARTERY HARVEST (Left)  1.  CV- Rapid Atrial Fibrillation, currently NSR with IV Amiodarone- will convert to oral regimen this evening, continue Lopressor, Imdur for radial graft 2. Pulm- wean oxygen as tolerated, + atelectasis continue IS- CXR pending 3. Leukocytosis- trending down, afebrile- likely stress response vs atelectasis 4. Volume Overload- continue diuresis 5. Dispo- patient doing well, Brief A. Fib this morning, currently NSR, will transition to oral Amiodarone this evening   LOS: 3 days    [name], [name] 02/13/2012   patient examined and medical record reviewed,agree with above note. VAN TRIGT III,PETER 02/13/2012

## 2012-02-14 ENCOUNTER — Other Ambulatory Visit: Payer: Self-pay | Admitting: *Deleted

## 2012-02-14 DIAGNOSIS — I2510 Atherosclerotic heart disease of native coronary artery without angina pectoris: Secondary | ICD-10-CM

## 2012-02-14 MED ORDER — ASPIRIN 325 MG PO TBEC: 325.0000 mg | DELAYED_RELEASE_TABLET | Freq: Every day | ORAL | Status: DC

## 2012-02-14 MED ORDER — METOPROLOL TARTRATE 25 MG PO TABS: 25.0000 mg | ORAL_TABLET | Freq: Two times a day (BID) | ORAL | Status: DC

## 2012-02-14 MED ORDER — METOPROLOL TARTRATE 25 MG PO TABS
25.0000 mg | ORAL_TABLET | Freq: Two times a day (BID) | ORAL | Status: DC
  Filled 2012-02-14 (×2): qty 1

## 2012-02-14 MED ORDER — ISOSORBIDE MONONITRATE ER 30 MG PO TB24: 30.0000 mg | ORAL_TABLET | Freq: Every day | ORAL | Status: DC

## 2012-02-14 MED ORDER — LACTULOSE 10 GM/15ML PO SOLN
20.0000 g | Freq: Once | ORAL | Status: AC
  Administered 2012-02-14: 20 g via ORAL
  Filled 2012-02-14 (×2): qty 30

## 2012-02-14 MED ORDER — AMIODARONE HCL 400 MG PO TABS: 400.0000 mg | ORAL_TABLET | Freq: Two times a day (BID) | ORAL | Status: DC

## 2012-02-14 MED ORDER — METOPROLOL TARTRATE 25 MG PO TABS
25.0000 mg | ORAL_TABLET | Freq: Two times a day (BID) | ORAL | Status: DC
  Administered 2012-02-14 – 2012-02-15 (×3): 25 mg via ORAL
  Filled 2012-02-14 (×4): qty 1

## 2012-02-14 MED ORDER — POTASSIUM CHLORIDE CRYS ER 20 MEQ PO TBCR: 20.0000 meq | EXTENDED_RELEASE_TABLET | Freq: Every day | ORAL | Status: DC

## 2012-02-14 MED ORDER — FUROSEMIDE 40 MG PO TABS: 40.0000 mg | ORAL_TABLET | Freq: Every day | ORAL | Status: DC

## 2012-02-14 MED ORDER — TRAMADOL HCL 50 MG PO TABS: 50.0000 mg | ORAL_TABLET | Freq: Four times a day (QID) | ORAL | Status: DC | PRN

## 2012-02-14 NOTE — Care Management Note (Unsigned)
    Page 1 of 1   02/14/2012     3:33:05 PM   CARE MANAGEMENT NOTE 02/14/2012  Patient:  [name] [name], [name] [name]   Account Number:  [id number]  Date Initiated:  02/14/2012  Documentation initiated by:  Francesco Provencal  Subjective/Objective Assessment:   PT ADM S/P EMERGENT CABG ON 02/10/12.  PTA, PT INDEPENDENT, LIVES WITH SPOUSE.     Action/Plan:   WIFE TO PROVIDE 24HR CARE AT DC.  WILL FOLLOW FOR HOME NEEDS AS PT PROGRESSES.   Anticipated DC Date:  02/15/2012   Anticipated DC Plan:  HOME/SELF CARE      DC Planning Services  CM consult      Choice offered to / List presented to:             Status of service:  In process, will continue to follow Medicare Important Message given?   (If response is "NO", the following Medicare IM given date fields will be blank) Date Medicare IM given:   Date Additional Medicare IM given:    Discharge Disposition:    Per UR Regulation:  Reviewed for med. necessity/level of care/duration of stay  If discussed at Long Length of Stay Meetings, dates discussed:    Comments:

## 2012-02-14 NOTE — Progress Notes (Signed)
CARDIAC REHAB PHASE I   PRE:  Rate/Rhythm: 81SR  BP:  Supine:   Sitting: 127/79  Standing:    SaO2: 97%RA  MODE:  Ambulation: 690 ft   POST:  Rate/Rhythem: 89SR  BP:  Supine:   Sitting: 151/81  Standing:    SaO2: 95%RA 0955-1027 Pt walked 690 ft on RA with hand held asst with steady gait. Tolerated well. Remained in NSR.  Duanne Limerick

## 2012-02-14 NOTE — Progress Notes (Signed)
Pt in a-fib with rate in 130's. Administered PRN dose of 2.5 mg of IV Lopressor. BP 144/85.

## 2012-02-14 NOTE — Progress Notes (Addendum)
                   [address]            [place] [address]          [phone]      4 Days Post-Op Procedure(s) (LRB): CORONARY ARTERY BYPASS GRAFTING (CABG) (N/A) INTRAOPERATIVE TRANSESOPHAGEAL ECHOCARDIOGRAM (N/A) RADIAL ARTERY HARVEST (Left)  Subjective: Patient passing flatus but no bowel movement yet.  Objective: Vital signs in last 24 hours: Temp:  [98.5 F (36.9 C)-99.2 F (37.3 C)] 98.5 F (36.9 C) (01/28 0606) Pulse Rate:  [77-91] 85  (01/28 0606) Cardiac Rhythm:  [-] Normal sinus rhythm (01/27 2015) Resp:  [18-19] 18  (01/28 0606) BP: (106-120)/(64-70) 120/69 mmHg (01/28 0606) SpO2:  [92 %-94 %] 92 % (01/28 0606) Weight:  [87.4 kg (192 lb 10.9 oz)] 87.4 kg (192 lb 10.9 oz) (01/28 0606)  Pre op weight 84 kg Current Weight  02/14/12 87.4 kg (192 lb 10.9 oz)     Intake/Output from previous day: 01/27 0701 - 01/28 0700 In: 720 [P.O.:720] Out: 350 [Urine:350]   Physical Exam:  Cardiovascular: RRR Pulmonary: Clear to auscultation bilaterally; no rales, wheezes, or rhonchi. Abdomen: Soft, non tender, bowel sounds present. Extremities: Mild bilateral lower extremity edema. Wounds: Clean and dry.  No erythema or signs of infection.  Lab Results: CBC: Basename 02/13/12 0500 02/12/12 0329  WBC 15.0* 15.8*  HGB 10.9* 10.9*  HCT 31.3* 30.7*  PLT 113* 110*   BMET:  Basename 02/13/12 0500 02/12/12 0329  NA 135 135  K 4.0 3.9  CL 102 103  CO2 24 24  GLUCOSE 108* 125*  BUN 20 17  CREATININE 0.93 1.10  CALCIUM 8.2* 7.9*    PT/INR:  Lab Results  Component Value Date   INR 1.40 02/11/2012   INR 1.1* 02/08/2012   ABG:  INR: Will add last result for INR, ABG once components are confirmed Will add last 4 CBG results once components are confirmed  Assessment/Plan:  1. CV - Post op afib with RVR. Had another run this am around 6:58 am. Currently, SR. On Amiodarone 400 bid, Imdur 30 daily, Lopressor 12.5 bid. Will increase Lopressor to  25 bid. 2.  Pulmonary - Encourage incentive spiormeter 3. Volume Overload - On Lasix 40 daily 4.  Acute blood loss anemia - Last H and H stable at 10.9 and 31.3. 5.Thrombocytopenia-last platelet count 113,000. 6.Remove EPW in am 7.LOC constipation 8.Possible discharge in am  ZIMMERMAN,DONIELLE MPA-C 02/14/2012,8:04 AM   patient examined and medical record reviewed,agree with above note. VAN TRIGT III,PETER 02/14/2012

## 2012-02-14 NOTE — Discharge Summary (Signed)
Physician Discharge Summary  Patient ID: [name] [name] MRN: [id number] DOB/AGE: June 21, 1940 72 y.o.  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 Clifton James on 02/10/2012: 2. Emergency coronary artery bypass grafting x3 (left internal mammary  artery to LAD, right internal mammary artery to RCA, left radial  artery free graft to circumflex marginal). Attempted but unsuccessful endoscopic harvest of left leg saphenous vein, which had been treated with laser ablation.  Placement of right femoral A-line for blood pressure monitoring and possible intraaortic balloon pump on 02/10/2012.  History of Presenting Illness: This is a 72 year old Caucasian male with a history of hyperlipidemia who  has noticed, since the early fall, some shortness of breath with exertion. It seems to be more prominent when it is warm and cold when he is walking he'll notice it. When he is going up steps, he will notice a little bit of burning in the chest. He says now he stops walking in cold weather. He did walk 2 flights of stairs up the other day and did not have a problem. He denies any rest symptoms. There is no significant radiation of the complaints.His father did die of a myocardial infarction at age 28. He has had a prior stroke----had workup in 2005 with MRA MRI and some vertebral stenosis. He underwent exercise testing and this was positive toward the end of stage 1 with symptoms. A cardiac catheterization was recommended. He presented on 02/10/2012 to [facility] for the catheterization. Results showed a  90% left main stenosis, an ostial 90% RCA stenosis, and a preserved LVEF. A cardiothoracic consultation was obtained urgently for the consideration of coronary artery bypass grafting surgery. Potential risks, complications, and benefits of the surgery were discussed with the patient and he agreed to proceed. Pre operative carotid duplex US showed no significant bilateral internal carotid artery stenosis. He underwent emergent CABG x 3 on 02/10/2012.   Brief Hospital Course:  He was extubated without difficulty post operative day one. He remained afebrile and hemodynamically stable. His [name] [name], a line, chest tubes, and foley were all removed early in his post operative course. He was started on Lopressor and Imur (radial artery harvest). He was volume overloaded and diuresed accordingly. He did have mild ABL anemia. His last H and H was 10.9 and 31.3. He also had thrombocytopenia. His last platelet count was 113,000. He then developed afib with RVR. He was given Lopressor 5 mg IV and started on an Amiodarone gttp. He converted to sinus rhythm. He was placed on oral Amiodarone and his Lopressor was increased. He was felt surgically stable for transfer from the ICU to PCTU for further convalescence on 02/13/2011. He has already been tolerating a diet and has had a bowel movement. He is ambulating fairly well on room air. His epicardial pacing wires and chest tube sutures will be removed in the morning. Provided he remains afebrile, hemodynamically stable, and pending morning round evaluation, he will be surgically stable for discharge on 02/15/2012.   Latest Vital Signs: Blood pressure 137/79, pulse 79, temperature 98.5 F (36.9 C), temperature source Oral, resp. rate 18, height 5\' 10"  (1.778 m), weight 87.4 kg (192 lb  10.9 oz), SpO2 92.00%.  Physical Exam: Cardiovascular: RRR  Pulmonary: Clear to auscultation bilaterally; no rales, wheezes, or rhonchi.  Abdomen: Soft, non tender, bowel sounds present.    Extremities: Mild bilateral lower extremity edema.  Wounds: Clean and dry. No erythema or signs of infection.   Discharge Condition:Stable  Recent laboratory studies:  Lab Results  Component Value Date   WBC 15.0* 02/13/2012   HGB 10.9* 02/13/2012   HCT 31.3* 02/13/2012   MCV 94.3 02/13/2012   PLT 113* 02/13/2012   Lab Results  Component Value Date   NA 135 02/13/2012   K 4.0 02/13/2012   CL 102 02/13/2012   CO2 24 02/13/2012   CREATININE 0.93 02/13/2012   GLUCOSE 108* 02/13/2012      Diagnostic Studies: Dg Chest 2 View  02/13/2012  *RADIOLOGY REPORT*  Clinical Data: Bypass surgery.  CHEST - 2 VIEW  Comparison: 02/12/2012.  Findings: The right IJ Cordis has been removed.  The heart is enlarged but stable.  Lower lung volumes with slight increase and bibasilar atelectasis.  Small pleural effusions are noted.  IMPRESSION:  1.  Stable cardiac enlargement. 2.  Small bilateral pleural effusions and bibasilar atelectasis.   Original Report Authenticated By: Rudie Meyer, M.D.     Ct Chest Wo Contrast  02/10/2012  *RADIOLOGY REPORT*  Clinical Data: Enlarged ascending aorta.  History of prostate cancer.  CT CHEST WITHOUT CONTRAST  Technique:  Multidetector CT imaging of the chest was performed following the standard protocol without IV contrast.  Comparison: No priors.  Findings:  Mediastinum: Heart size is normal. There is no significant pericardial fluid, thickening or pericardial calcification. There is atherosclerosis of the thoracic aorta, the great vessels of the mediastinum and the coronary arteries, including calcified atherosclerotic plaque in the left main, left anterior descending, left circumflex and right coronary arteries. Notably, despite the extensive calcified plaque, there is no significant calcified plaque in the ascending thoracic aorta, however, the ascending thoracic aorta is mildly ectatic (4.2 cm in diameter).  No pathologically enlarged mediastinal or hilar lymph nodes. Please  note that accurate exclusion of hilar adenopathy is limited on noncontrast CT scans.  There is a small hiatal hernia.  Lungs/Pleura: There are a few scattered tiny 1-3 mm pulmonary nodules in the lungs bilaterally. In addition, there is a 5 mm subpleural nodule in the left lower lobe inferiorly (image 46 of series 3) which is unchanged in retrospect compared to abdominal CT scan 04/24/2003, and can be considered radiographically benign requiring no imaging follow-up.  No larger or suspicious appearing pulmonary nodules or masses are otherwise identified. No acute consolidative airspace disease.  No pleural effusions.  Upper Abdomen: Iodinated contrast material is seen within the collecting systems of the kidneys bilaterally, presumably related to recent catheterization procedure.  There is also small amount of high attenuation material layering dependently in the gallbladder, likely to represent vicarious excretion of contrast into the bile. Alternatively, this could represent a small amount of biliary sludge.  Haziness in the small bowel mesentery is noted, and is nonspecific, but similar in retrospect compared to remote prior examination 04/24/2003, presumably related to a benign process.  Musculoskeletal: There are no aggressive appearing lytic or blastic lesions noted in the visualized portions of the skeleton.  IMPRESSION: 1.  Mild ectasia of the ascending thoracic aorta (4.2 cm in diameter).  Notably, despite the extensive atherosclerosis in the aortic arch, great vessels, and the coronary arteries (left main and three-vessel), the ascending aorta does not appear  to be calcified (is reassuring for CABG procedure).  2.  Multiple tiny 1-3 mm pulmonary nodules in the lungs bilaterally are highly nonspecific. If the patient is at high risk for bronchogenic carcinoma, follow-up chest CT at 1 year is recommended.  If the patient is at low risk, no follow-up is needed.  This recommendation follows the consensus  statement: Guidelines for Management of Small Pulmonary Nodules Detected on CT Scans:  A Statement from the Fleischner Society as published in Radiology 2005; 237:395-400. 3.  Additional incidental findings, as above.   Original Report Authenticated By: Trudie Reed, M.D.     Discharge Orders    Future Appointments: Provider: Department: Dept Phone: Center:   03/07/2012 3:00 PM Kerin Perna, MD [facility] [phone] [id number]      Discharge Medications:   Medication List     As of 02/14/2012  2:00 PM    STOP taking these medications         OVER THE COUNTER MEDICATION      TAKE these medications         amiodarone 400 MG tablet   Commonly known as: PACERONE   Take 1 tablet (400 mg total) by mouth 2 (two) times daily. For one week;then take Amiodarone 400 mg po daily thereafter      aspirin 325 MG EC tablet   Take 1 tablet (325 mg total) by mouth daily.      atorvastatin 20 MG tablet   Commonly known as: LIPITOR   Take 20 mg by mouth daily.      BEE POLLEN PO   Take 2 capsules by mouth daily.      furosemide 40 MG tablet   Commonly known as: LASIX   Take 1 tablet (40 mg total) by mouth daily. For 5 days then stop.      isosorbide mononitrate 30 MG 24 hr tablet   Commonly known as: IMDUR   Take 1 tablet (30 mg total) by mouth daily. For one month then stop.      metoprolol tartrate 25 MG tablet   Commonly known as: LOPRESSOR   Take 1 tablet (25 mg total) by mouth 2 (two) times daily.      potassium chloride SA 20 MEQ tablet   Commonly known as: K-DUR,KLOR-CON   Take 1 tablet (20 mEq total) by mouth daily. For 5 days then stop.      RED YEAST RICE PO   Take 1 tablet by mouth daily.      SAW PALMETTO PO   Take 1 tablet by mouth daily.      traMADol 50 MG tablet   Commonly known as: ULTRAM   Take 1 tablet (50 mg total) by mouth every 6 (six) hours as needed for pain.        Follow Up Appointments:       Follow-up Information    Follow up with MCALHANY,CHRISTOPHER, MD. (Call for a follow up appointment for 2 weeks)    Contact information:   [address] [place] [address] [phone]       Follow up with VAN Dinah Beers, MD. (PA/LAT CXR to be taken (at [facility] which is in the same building as Dr. Zenaida Niece Trigt's office) on 03/07/2012 at 2:00pm;Appointment with Dr. Donata Clay is on 03/07/2012 at 3:00 pm)    Contact information:   [address] [employer] [address] [place] [address] [phone]      Appointment  with the nurse at Dr. Zenaida Niece Trigt's office is on 02/24/2012 at 10:00. This is for left forearm staple removal.    Signed: Xzavior Reinig MPA-C 02/14/2012, 2:00 PM

## 2012-02-15 ENCOUNTER — Inpatient Hospital Stay
Admit: 2012-02-15 | Discharge: 2012-02-15 | Disposition: A | Discharge status: Home / Self Care | Payer: Medicare Other | Attending: Cardiothoracic Surgery | Admitting: Cardiothoracic Surgery

## 2012-02-15 DIAGNOSIS — I2510 Atherosclerotic heart disease of native coronary artery without angina pectoris: Secondary | ICD-10-CM

## 2012-02-15 NOTE — Progress Notes (Signed)
02/15/2012 10:49 AM D/c epw and cts per orders and per protocol. Ends of EPW intact. Pt. Tolerated well. Advised BR x1 hr post removal. Frequent vital signs checked per protocol. Benzoin and steri strips applied to ct sites.  Zayvien Canning, Blanchard Kelch

## 2012-02-15 NOTE — Progress Notes (Signed)
02/15/2012 3:29 PM Nursing note Discharge avs form, medications already taken today and those due this evening given and explained to patient and family. Rx given to patient. Pt. States he has already viewed video #113. Follow up appointments, when to call MD and incision site care reviewed. Pt. States he already has moving right along educational booklet as well.  Questions and concerns addressed. D/c iv line. D/c tele. D/c home per orders.  Skippy Marhefka, Blanchard Kelch

## 2012-02-15 NOTE — Progress Notes (Signed)
Pt doing well this am. Plans for possible d/c today. Sinus on tele. No complaints. He will need to f/u with Dr. Riley Kill in 10-14 days. Will route this to our office to arrange f/u.   [name] [name] 8:43 AM 02/15/2012

## 2012-02-15 NOTE — Progress Notes (Signed)
Educated pt on consistently using IS, sternal precautions, exercise guidelines, angina, nutrition, and CRP II. Understanding voice. Wife was in the room and listened to majority of the education. Pt states that he has read the book and watched the videos.  [id number] [name] Dessert, MS, ACSM CES

## 2012-02-15 NOTE — Progress Notes (Signed)
CARDIAC REHAB PHASE I   PRE:  Rate/Rhythm: 86 SR  BP:  Supine:   Sitting: 126/60  Standing:    SaO2: 97 RA  MODE:  Ambulation: 890 ft   POST:  Rate/Rhythem: 94 SR  BP:  Supine:   Sitting: 130/60  Standing:    SaO2: 97 RA [id number] Pt tolerated ambulation well without c/o. VS stable Pt back to side of bed after walk with call light in reach. Discussed Outpt. CRP with pt he agrees to referral to [place].  [name] [name]

## 2012-02-15 NOTE — Progress Notes (Addendum)
                   [address]            [place] [address]          [phone]      5 Days Post-Op Procedure(s) (LRB): CORONARY ARTERY BYPASS GRAFTING (CABG) (N/A) INTRAOPERATIVE TRANSESOPHAGEAL ECHOCARDIOGRAM (N/A) RADIAL ARTERY HARVEST (Left)  Subjective: Patient with bowel movement. No specific complaints.  Objective: Vital signs in last 24 hours: Temp:  [97.8 F (36.6 C)-99.3 F (37.4 C)] 98.8 F (37.1 C) (01/29 0518) Pulse Rate:  [79-85] 83  (01/29 0518) Cardiac Rhythm:  [-] Normal sinus rhythm (01/29 0740) Resp:  [18] 18  (01/29 0518) BP: (117-142)/(67-79) 142/71 mmHg (01/29 0518) SpO2:  [95 %-99 %] 95 % (01/29 0518) Weight:  [86.7 kg (191 lb 2.2 oz)] 86.7 kg (191 lb 2.2 oz) (01/29 0518)  Pre op weight 84 kg Current Weight  02/15/12 86.7 kg (191 lb 2.2 oz)     Intake/Output from previous day: 01/28 0701 - 01/29 0700 In: 840 [P.O.:840] Out: -    Physical Exam:  Cardiovascular: RRR Pulmonary: Clear to auscultation bilaterally; no rales, wheezes, or rhonchi. Abdomen: Soft, non tender, bowel sounds present. Extremities: Mild bilateral lower extremity edema. Wounds: Clean and dry.  No erythema or signs of infection.  Lab Results: CBC:  Basename 02/13/12 0500  WBC 15.0*  HGB 10.9*  HCT 31.3*  PLT 113*   BMET:   Basename 02/13/12 0500  NA 135  K 4.0  CL 102  CO2 24  GLUCOSE 108*  BUN 20  CREATININE 0.93  CALCIUM 8.2*    PT/INR:  Lab Results  Component Value Date   INR 1.40 02/11/2012   INR 1.1* 02/08/2012   ABG:  INR: Will add last result for INR, ABG once components are confirmed Will add last 4 CBG results once components are confirmed  Assessment/Plan:  1. CV - Post op afib with RVR. Had another run this am around 6:58 am yesterday. Maintaining SR since then. On Amiodarone 400 bid, Imdur 30 daily,and  Lopressor 25 bid. 2.  Pulmonary - Encourage incentive spiormeter 3. Volume Overload - On Lasix 40 daily 4.  Acute  blood loss anemia - Last H and H stable at 10.9 and 31.3. 5.Thrombocytopenia-last platelet count 113,000. 6.Remove EPW and chest tube sutures 7.Possible discharge later today   ZIMMERMAN,DONIELLE MPA-C 02/15/2012,8:08 AM    patient examined and medical record reviewed,agree with above note.  DC home later today VAN TRIGT III,Ambrosia Wisnewski 02/15/2012

## 2012-02-20 NOTE — Discharge Summary (Signed)
patient examined and medical record reviewed,agree with above note. VAN TRIGT III,Latera Mclin 02/20/2012    

## 2012-02-21 ENCOUNTER — Other Ambulatory Visit: Payer: Self-pay | Admitting: *Deleted

## 2012-02-21 DIAGNOSIS — I2510 Atherosclerotic heart disease of native coronary artery without angina pectoris: Secondary | ICD-10-CM

## 2012-02-23 ENCOUNTER — Telehealth: Payer: Self-pay | Admitting: Cardiothoracic Surgery

## 2012-02-23 NOTE — Telephone Encounter (Signed)
Message copied by Burgess Estelle MD, PETER on Thu Feb 23, 2012 10:45 AM ------      Message from: Rodman Pickle      Created: Mon Feb 13, 2012  3:17 PM      Regarding: [id number]       Please note query issued 02/13/12.

## 2012-02-23 NOTE — Telephone Encounter (Signed)
Message copied by Burgess Estelle MD, Waynesha Rammel on Thu Feb 23, 2012 10:46 AM ------      Message from: Rodman Pickle      Created: Mon Feb 13, 2012  3:17 PM      Regarding: [id number]       Please note query issued 02/13/12.

## 2012-02-24 ENCOUNTER — Encounter (INDEPENDENT_AMBULATORY_CARE_PROVIDER_SITE_OTHER): Payer: Self-pay

## 2012-02-24 DIAGNOSIS — D381 Neoplasm of uncertain behavior of trachea, bronchus and lung: Secondary | ICD-10-CM

## 2012-02-28 ENCOUNTER — Encounter: Payer: Self-pay | Admitting: Cardiology

## 2012-02-28 ENCOUNTER — Other Ambulatory Visit: Payer: Self-pay

## 2012-02-28 ENCOUNTER — Ambulatory Visit (INDEPENDENT_AMBULATORY_CARE_PROVIDER_SITE_OTHER): Payer: Medicare Other | Admitting: Cardiology

## 2012-02-28 VITALS — BP 138/70 | HR 46 | Wt 186.2 lb

## 2012-02-28 DIAGNOSIS — E785 Hyperlipidemia, unspecified: Secondary | ICD-10-CM

## 2012-02-28 DIAGNOSIS — I2510 Atherosclerotic heart disease of native coronary artery without angina pectoris: Secondary | ICD-10-CM

## 2012-02-28 DIAGNOSIS — I498 Other specified cardiac arrhythmias: Secondary | ICD-10-CM

## 2012-02-28 DIAGNOSIS — I209 Angina pectoris, unspecified: Secondary | ICD-10-CM

## 2012-02-28 DIAGNOSIS — I25119 Atherosclerotic heart disease of native coronary artery with unspecified angina pectoris: Secondary | ICD-10-CM

## 2012-02-28 LAB — CBC WITH DIFFERENTIAL/PLATELET
Basophils Absolute: 0.1 10*3/uL (ref 0.0–0.1)
Eosinophils Relative: 4.2 % (ref 0.0–5.0)
HCT: 34.9 % — ABNORMAL LOW (ref 39.0–52.0)
Hemoglobin: 11.7 g/dL — ABNORMAL LOW (ref 13.0–17.0)
Lymphocytes Relative: 6.5 % — ABNORMAL LOW (ref 12.0–46.0)
Lymphs Abs: 0.7 10*3/uL (ref 0.7–4.0)
Monocytes Relative: 8.2 % (ref 3.0–12.0)
Neutro Abs: 9.2 10*3/uL — ABNORMAL HIGH (ref 1.4–7.7)
RDW: 12.8 % (ref 11.5–14.6)
WBC: 11.4 10*3/uL — ABNORMAL HIGH (ref 4.5–10.5)

## 2012-02-28 LAB — BASIC METABOLIC PANEL
CO2: 27 mEq/L (ref 19–32)
Calcium: 8.8 mg/dL (ref 8.4–10.5)
Chloride: 99 mEq/L (ref 96–112)
Creatinine, Ser: 1.2 mg/dL (ref 0.4–1.5)
Glucose, Bld: 92 mg/dL (ref 70–99)
Sodium: 133 mEq/L — ABNORMAL LOW (ref 135–145)

## 2012-02-28 NOTE — Assessment & Plan Note (Signed)
Should be managed by Dr. Jonny Ruiz going forward.

## 2012-02-28 NOTE — Assessment & Plan Note (Signed)
Patient underwent cath and now CABG.  Doing well.  Slowly improving.

## 2012-02-28 NOTE — Patient Instructions (Addendum)
Your physician has recommended you make the following change in your medication: DECREASE Metoprolol Tartrate to 25mg  take one-half tablet by mouth twice a day, DECREASE Amiodarone to 200mg  daily  Your physician recommends that you have lab work today: CBC and BMP  Your physician recommends that you schedule a follow-up appointment in: 1 WEEK with Dr Riley Kill

## 2012-02-28 NOTE — Progress Notes (Signed)
HPI:  Patient returns today for a followup visit. From a cardiac standpoint he seems to be doing okay. He said his hands and his feet are cold, but otherwise he is stable. He underwent revascularization surgery, and overall seems to be doing quite well.   Current Outpatient Prescriptions  Medication Sig Dispense Refill  . amiodarone (PACERONE) 400 MG tablet Take 400 mg by mouth daily.      Marland Kitchen aspirin EC 325 MG EC tablet Take 1 tablet (325 mg total) by mouth daily.  30 tablet    . atorvastatin (LIPITOR) 20 MG tablet Take 20 mg by mouth daily.      Marland Kitchen BEE POLLEN PO Take 2 capsules by mouth daily.      . isosorbide mononitrate (IMDUR) 30 MG 24 hr tablet Take 1 tablet (30 mg total) by mouth daily. For one month then stop.  30 tablet  0  . metoprolol tartrate (LOPRESSOR) 25 MG tablet Take 1 tablet (25 mg total) by mouth 2 (two) times daily.  60 tablet  0  . Red Yeast Rice Extract (RED YEAST RICE PO) Take 1 tablet by mouth daily.      . Saw Palmetto, Serenoa repens, (SAW PALMETTO PO) Take 1 tablet by mouth daily.      . traMADol (ULTRAM) 50 MG tablet Take 1 tablet (50 mg total) by mouth every 6 (six) hours as needed for pain.  40 tablet  0   No current facility-administered medications for this visit.    Allergies  Allergen Reactions  . Niacin     REACTION: hives  . Simvastatin Other (See Comments)    myalgias    Past Medical History  Diagnosis Date  . 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 TIA 12/03/2007  . CEREBROVASCULAR ACCIDENT, HX OF 09/20/2007  . HYPERLIPIDEMIA 12/18/2006  . PERIPHERAL VASCULAR DISEASE 09/20/2007  . BRADYCARDIA 09/20/2007  . DIVERTICULOSIS, COLON 09/20/2007  . COLONIC POLYPS, HX OF 12/18/2006    Past Surgical History  Procedure Laterality Date  . Coronary artery bypass graft  02/10/2012    Procedure: CORONARY ARTERY BYPASS GRAFTING (CABG);  Surgeon: Kerin Perna, MD;  Location: [facility] OR;  Service: Open Heart Surgery;  Laterality: N/A;  CABG x three, using Bilateral  mammary artery harvest and left arm radial harvest  . Intraoperative transesophageal echocardiogram  02/10/2012    Procedure: INTRAOPERATIVE TRANSESOPHAGEAL ECHOCARDIOGRAM;  Surgeon: Kerin Perna, MD;  Location: [facility] OR;  Service: Open Heart Surgery;  Laterality: N/A;  . Radial artery harvest  02/10/2012    Procedure: RADIAL ARTERY HARVEST;  Surgeon: Kerin Perna, MD;  Location: [facility] OR;  Service: Open Heart Surgery;  Laterality: Left;    Family History  Problem Relation Age of Onset  . Heart disease Father   . Cancer Brother     History   Social History  . Marital Status: Married    Spouse Name: N/A    Number of Children: N/A  . Years of Education: N/A   Occupational History  . Not on file.   Social History Main Topics  . Smoking status: Never Smoker   . Smokeless tobacco: Never Used  . Alcohol Use: No  . Drug Use: No  . Sexually Active: Not on file   Other Topics Concern  . Not on file   Social History Narrative  . No narrative on file    ROS: Please see the HPI.  All other systems reviewed and negative.  PHYSICAL EXAM:  BP 138/70  Pulse 46  Wt 186 lb 4 oz (84.482 kg)  BMI 26.72 kg/m2  General: Well developed, well nourished, in no acute distress. Head:  Normocephalic and atraumatic. Neck: no JVD Lungs: decrease bs in R base.   Heart: Normal S1 and S2.   No def murmur Abdomen:  Normal bowel sounds; soft; non tender; no organomegaly Pulses:  Extremities: No clubbing or cyanosis. No edema.  Left radial harvest site looks good.    Left leg stable (not used) Neurologic: Alert and oriented x 3.  EKG:  SB. Otherwise normal    ASSESSMENT AND PLAN:

## 2012-02-28 NOTE — Assessment & Plan Note (Signed)
We will reduce the amio to 200mg  per day and reduce the metoprolol 12.5 bid.

## 2012-02-29 ENCOUNTER — Other Ambulatory Visit (INDEPENDENT_AMBULATORY_CARE_PROVIDER_SITE_OTHER): Payer: Medicare Other

## 2012-02-29 DIAGNOSIS — I2510 Atherosclerotic heart disease of native coronary artery without angina pectoris: Secondary | ICD-10-CM

## 2012-02-29 LAB — CBC WITH DIFFERENTIAL/PLATELET
Basophils Absolute: 0 10*3/uL (ref 0.0–0.1)
Basophils Relative: 0.3 % (ref 0.0–3.0)
Eosinophils Absolute: 0.5 10*3/uL (ref 0.0–0.7)
Hemoglobin: 12.1 g/dL — ABNORMAL LOW (ref 13.0–17.0)
MCHC: 33.5 g/dL (ref 30.0–36.0)
MCV: 97.8 fl (ref 78.0–100.0)
Monocytes Absolute: 0.7 10*3/uL (ref 0.1–1.0)
Neutro Abs: 6.8 10*3/uL (ref 1.4–7.7)
RBC: 3.69 Mil/uL — ABNORMAL LOW (ref 4.22–5.81)
RDW: 12.8 % (ref 11.5–14.6)

## 2012-03-06 ENCOUNTER — Ambulatory Visit (INDEPENDENT_AMBULATORY_CARE_PROVIDER_SITE_OTHER): Payer: Medicare Other | Admitting: Cardiology

## 2012-03-06 VITALS — BP 152/74 | HR 50 | Ht 70.0 in | Wt 187.4 lb

## 2012-03-06 DIAGNOSIS — I209 Angina pectoris, unspecified: Secondary | ICD-10-CM

## 2012-03-06 DIAGNOSIS — I25810 Atherosclerosis of coronary artery bypass graft(s) without angina pectoris: Secondary | ICD-10-CM

## 2012-03-06 DIAGNOSIS — I2510 Atherosclerotic heart disease of native coronary artery without angina pectoris: Secondary | ICD-10-CM

## 2012-03-06 DIAGNOSIS — I25119 Atherosclerotic heart disease of native coronary artery with unspecified angina pectoris: Secondary | ICD-10-CM

## 2012-03-06 DIAGNOSIS — I498 Other specified cardiac arrhythmias: Secondary | ICD-10-CM

## 2012-03-06 DIAGNOSIS — E785 Hyperlipidemia, unspecified: Secondary | ICD-10-CM

## 2012-03-06 NOTE — Progress Notes (Signed)
HPI:  Patient returns in followup. He is doing extremely well. He denies any complaints other than left chest soreness. He is actually healing pretty nicely at this point in time however.  Current Outpatient Prescriptions  Medication Sig Dispense Refill  . amiodarone (PACERONE) 200 MG tablet Take 1 tablet (200 mg total) by mouth daily.      Marland Kitchen aspirin EC 325 MG EC tablet Take 1 tablet (325 mg total) by mouth daily.  30 tablet    . atorvastatin (LIPITOR) 20 MG tablet Take 20 mg by mouth daily.      Marland Kitchen BEE POLLEN PO Take 2 capsules by mouth daily.      . isosorbide mononitrate (IMDUR) 30 MG 24 hr tablet Take 1 tablet (30 mg total) by mouth daily. For one month then stop.  30 tablet  0  . metoprolol tartrate (LOPRESSOR) 25 MG tablet Take 0.5 tablets (12.5 mg total) by mouth 2 (two) times daily.  1 tablet  0  . Red Yeast Rice Extract (RED YEAST RICE PO) Take 1 tablet by mouth daily.      . Saw Palmetto, Serenoa repens, (SAW PALMETTO PO) Take 1 tablet by mouth daily.      . traMADol (ULTRAM) 50 MG tablet Take 1 tablet (50 mg total) by mouth every 6 (six) hours as needed for pain.  40 tablet  0   No current facility-administered medications for this visit.    Allergies  Allergen Reactions  . Niacin     REACTION: hives  . Simvastatin Other (See Comments)    myalgias    Past Medical History  Diagnosis Date  . 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 TIA 12/03/2007  . CEREBROVASCULAR ACCIDENT, HX OF 09/20/2007  . HYPERLIPIDEMIA 12/18/2006  . PERIPHERAL VASCULAR DISEASE 09/20/2007  . BRADYCARDIA 09/20/2007  . DIVERTICULOSIS, COLON 09/20/2007  . COLONIC POLYPS, HX OF 12/18/2006    Past Surgical History  Procedure Laterality Date  . Coronary artery bypass graft  02/10/2012    Procedure: CORONARY ARTERY BYPASS GRAFTING (CABG);  Surgeon: Kerin Perna, MD;  Location: [facility] OR;  Service: Open Heart Surgery;  Laterality: N/A;  CABG x three, using Bilateral mammary artery harvest and left arm radial harvest   . Intraoperative transesophageal echocardiogram  02/10/2012    Procedure: INTRAOPERATIVE TRANSESOPHAGEAL ECHOCARDIOGRAM;  Surgeon: Kerin Perna, MD;  Location: [facility] OR;  Service: Open Heart Surgery;  Laterality: N/A;  . Radial artery harvest  02/10/2012    Procedure: RADIAL ARTERY HARVEST;  Surgeon: Kerin Perna, MD;  Location: [facility] OR;  Service: Open Heart Surgery;  Laterality: Left;    Family History  Problem Relation Age of Onset  . Heart disease Father   . Cancer Brother     History   Social History  . Marital Status: Married    Spouse Name: N/A    Number of Children: N/A  . Years of Education: N/A   Occupational History  . Not on file.   Social History Main Topics  . Smoking status: Never Smoker   . Smokeless tobacco: Never Used  . Alcohol Use: No  . Drug Use: No  . Sexually Active: Not on file   Other Topics Concern  . Not on file   Social History Narrative  . No narrative on file    ROS: Please see the HPI.  All other systems reviewed and negative.  PHYSICAL EXAM:  BP 152/74  Pulse 50  Ht 5\' 10"  (1.778 m)  Wt 187  lb 6.4 oz (85.004 kg)  BMI 26.89 kg/m2  General: Well developed, well nourished, in no acute distress. Head:  Normocephalic and atraumatic. Neck: no JVD.  Sternotomy looks good.   Lungs: Clear to auscultation and percussion. Heart: Normal S1 and S2.  No murmur, rubs or gallops.  Abdomen:  Normal bowel sounds; soft; non tender; no organomegaly Pulses: Pulses normal in all 4 extremities. Extremities: No clubbing or cyanosis. No edema. Neurologic: Alert and oriented x 3.  EKG:  SB  First degree av block.    ASSESSMENT AND PLAN:

## 2012-03-06 NOTE — Assessment & Plan Note (Signed)
Resolved with CABG.

## 2012-03-06 NOTE — Patient Instructions (Addendum)
Your physician has recommended you make the following change in your medication: DECREASE Amiodarone to 200mg  take one-half tablet by mouth daily  Your physician recommends that you schedule a follow-up appointment in: 3 WEEKS--nothing to eat or drink after midnight

## 2012-03-06 NOTE — Assessment & Plan Note (Signed)
Will check a lipid profile that day.

## 2012-03-06 NOTE — Assessment & Plan Note (Signed)
NO current symptoms to suggest atrial fib.  Return in two to three weeks.  Decrease amiodarone to 100mg  daily.

## 2012-03-07 ENCOUNTER — Encounter: Payer: Self-pay | Admitting: Cardiothoracic Surgery

## 2012-03-07 ENCOUNTER — Ambulatory Visit (INDEPENDENT_AMBULATORY_CARE_PROVIDER_SITE_OTHER): Payer: Self-pay | Admitting: Cardiothoracic Surgery

## 2012-03-07 ENCOUNTER — Ambulatory Visit
Admission: RE | Admit: 2012-03-07 | Discharge: 2012-03-07 | Disposition: A | Discharge status: Home / Self Care | Payer: Medicare Other | Source: Ambulatory Visit | Attending: Cardiothoracic Surgery | Admitting: Cardiothoracic Surgery

## 2012-03-07 VITALS — BP 120/64 | HR 54 | Resp 20 | Ht 70.0 in | Wt 184.0 lb

## 2012-03-07 DIAGNOSIS — Z951 Presence of aortocoronary bypass graft: Secondary | ICD-10-CM

## 2012-03-07 DIAGNOSIS — I2510 Atherosclerotic heart disease of native coronary artery without angina pectoris: Secondary | ICD-10-CM

## 2012-03-07 NOTE — Progress Notes (Signed)
PCP is Oliver Barre, MD Referring Provider is Kathleene Hazel*  Chief Complaint  Patient presents with  . 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 Bilateral IMA graft and left radial artery free graft. Patient doing well at home without shortness of breath, without chest pain, and surgical incisions healing well. No neurologic complaint of left hand.   Past Medical History  Diagnosis Date  . 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 TIA 12/03/2007  . CEREBROVASCULAR ACCIDENT, HX OF 09/20/2007  . HYPERLIPIDEMIA 12/18/2006  . PERIPHERAL VASCULAR DISEASE 09/20/2007  . BRADYCARDIA 09/20/2007  . DIVERTICULOSIS, COLON 09/20/2007  . COLONIC POLYPS, HX OF 12/18/2006    Past Surgical History  Procedure Laterality Date  . Coronary artery bypass graft  02/10/2012    Procedure: CORONARY ARTERY BYPASS GRAFTING (CABG);  Surgeon: Kerin Perna, MD;  Location: [facility] OR;  Service: Open Heart Surgery;  Laterality: N/A;  CABG x three, using Bilateral mammary artery harvest and left arm radial harvest  . Intraoperative transesophageal echocardiogram  02/10/2012    Procedure: INTRAOPERATIVE TRANSESOPHAGEAL ECHOCARDIOGRAM;  Surgeon: Kerin Perna, MD;  Location: [facility] OR;  Service: Open Heart Surgery;  Laterality: N/A;  . Radial artery harvest  02/10/2012    Procedure: RADIAL ARTERY HARVEST;  Surgeon: Kerin Perna, MD;  Location: [facility] OR;  Service: Open Heart Surgery;  Laterality: Left;    Family History  Problem Relation Age of Onset  . Heart disease Father   . Cancer Brother     Social History History  Substance Use Topics  . Smoking status: Never Smoker   . Smokeless tobacco: Never Used  . Alcohol Use: No    Current Outpatient Prescriptions  Medication Sig Dispense Refill  . amiodarone (PACERONE) 200 MG tablet Take 0.5 tablets (100 mg total) by mouth daily.      Marland Kitchen aspirin EC 325 MG EC tablet Take 1 tablet  (325 mg total) by mouth daily.  30 tablet    . atorvastatin (LIPITOR) 20 MG tablet Take 20 mg by mouth daily.      Marland Kitchen BEE POLLEN PO Take 2 capsules by mouth daily.      . isosorbide mononitrate (IMDUR) 30 MG 24 hr tablet Take 1 tablet (30 mg total) by mouth daily. For one month then stop.  30 tablet  0  . metoprolol tartrate (LOPRESSOR) 25 MG tablet Take 0.5 tablets (12.5 mg total) by mouth 2 (two) times daily.  1 tablet  0  . Red Yeast Rice Extract (RED YEAST RICE PO) Take 1 tablet by mouth daily.      . Saw Palmetto, Serenoa repens, (SAW PALMETTO PO) Take 1 tablet by mouth daily.      . traMADol (ULTRAM) 50 MG tablet Take 1 tablet (50 mg total) by mouth every 6 (six) hours as needed for pain.  40 tablet  0   No current facility-administered medications for this visit.    Allergies  Allergen Reactions  . Niacin     REACTION: hives  . Simvastatin Other (See Comments)    myalgias    Review of Systems improved appetite improved strength walking a mile a day  BP 120/64  Pulse 54  Resp 20  Ht 5\' 10"  (1.778 m)  Wt 184 lb (83.462 kg)  BMI 26.4 kg/m2  SpO2 97% Physical Exam Alert and comfortable Chest incision healed Breath  sounds clear Cardiac rhythm regular murmur or gallop No pedal edema Left arm incision well-healed good hand grip good left ulnar pulse  Diagnostic Tests: Chest x-ray with small right pleural effusion otherwise clear  Impression:  Doing well after emergency CABG x3 using all arterial grafts Plan: Patient may drive in the light activities. No heavy lifting until 3 months after surgery. Patient instructed to discontinue his iimdur. Return as needed.

## 2012-03-15 ENCOUNTER — Encounter (HOSPITAL_COMMUNITY)
Admission: RE | Admit: 2012-03-15 | Discharge: 2012-03-15 | Disposition: A | Discharge status: Home / Self Care | Payer: Medicare Other | Source: Ambulatory Visit | Attending: Cardiology | Admitting: Cardiology

## 2012-03-15 NOTE — Progress Notes (Signed)
Cardiac Rehab Medication Review by a Pharmacist  Does the patient  feel that his/her medications are working for him/her?  yes  Has the patient been experiencing any side effects to the medications prescribed?  no  Does the patient measure his/her own blood pressure or blood glucose at home?  yes   Does the patient have any problems obtaining medications due to transportation or finances?   no  Understanding of regimen: good Understanding of indications: good Potential of compliance: excellent    Pharmacist comments: Patient has excellent compliance and good understanding of regimen and indications.    Dub Mikes 03/15/2012 8:42 AM

## 2012-03-19 ENCOUNTER — Encounter (HOSPITAL_COMMUNITY): Payer: Medicare Other

## 2012-03-21 ENCOUNTER — Ambulatory Visit: Payer: Medicare Other | Admitting: Cardiology

## 2012-03-21 ENCOUNTER — Encounter (HOSPITAL_COMMUNITY)
Admission: RE | Admit: 2012-03-21 | Discharge: 2012-03-21 | Disposition: A | Discharge status: Home / Self Care | Payer: Medicare Other | Source: Ambulatory Visit | Attending: Cardiology | Admitting: Cardiology

## 2012-03-21 DIAGNOSIS — I2510 Atherosclerotic heart disease of native coronary artery without angina pectoris: Secondary | ICD-10-CM | POA: Insufficient documentation

## 2012-03-21 DIAGNOSIS — Z5189 Encounter for other specified aftercare: Secondary | ICD-10-CM | POA: Insufficient documentation

## 2012-03-21 DIAGNOSIS — Z951 Presence of aortocoronary bypass graft: Secondary | ICD-10-CM | POA: Insufficient documentation

## 2012-03-21 DIAGNOSIS — I739 Peripheral vascular disease, unspecified: Secondary | ICD-10-CM | POA: Insufficient documentation

## 2012-03-21 NOTE — Progress Notes (Addendum)
Pt started cardiac rehab today.  Pt tolerated light exercise without difficulty. Telemetry rhythm sinus without ectopy. Vital signs stable. Will continue to monitor the patient throughout  the program.

## 2012-03-22 ENCOUNTER — Ambulatory Visit (INDEPENDENT_AMBULATORY_CARE_PROVIDER_SITE_OTHER): Payer: Medicare Other | Admitting: Cardiology

## 2012-03-22 ENCOUNTER — Encounter: Payer: Self-pay | Admitting: Cardiology

## 2012-03-22 VITALS — BP 134/70 | HR 51 | Ht 69.25 in | Wt 179.1 lb

## 2012-03-22 DIAGNOSIS — E785 Hyperlipidemia, unspecified: Secondary | ICD-10-CM

## 2012-03-22 LAB — CBC WITH DIFFERENTIAL/PLATELET
Eosinophils Absolute: 0.1 10*3/uL (ref 0.0–0.7)
Eosinophils Relative: 1.4 % (ref 0.0–5.0)
HCT: 40.9 % (ref 39.0–52.0)
Lymphs Abs: 0.9 10*3/uL (ref 0.7–4.0)
MCHC: 33.9 g/dL (ref 30.0–36.0)
MCV: 94.3 fl (ref 78.0–100.0)
Monocytes Absolute: 0.9 10*3/uL (ref 0.1–1.0)
Platelets: 383 10*3/uL (ref 150.0–400.0)
RDW: 13.8 % (ref 11.5–14.6)

## 2012-03-22 LAB — HEPATIC FUNCTION PANEL
ALT: 22 U/L (ref 0–53)
AST: 25 U/L (ref 0–37)
Alkaline Phosphatase: 104 U/L (ref 39–117)
Bilirubin, Direct: 0 mg/dL (ref 0.0–0.3)
Total Bilirubin: 0.7 mg/dL (ref 0.3–1.2)

## 2012-03-22 LAB — LIPID PANEL: Total CHOL/HDL Ratio: 3

## 2012-03-22 NOTE — Assessment & Plan Note (Signed)
We will go ahead at this point in time and stop the amiodarone.  He is moderately bradycardiac.

## 2012-03-22 NOTE — Assessment & Plan Note (Signed)
Stable post op and doing well.

## 2012-03-22 NOTE — Patient Instructions (Addendum)
Your physician recommends that you have lab work today: CBC, LIPID and LIVER  Your physician has recommended you make the following change in your medication: STOP Amiodarone in 1 WEEK  Your physician recommends that you schedule a follow-up appointment in: 6 WEEKS with Dr Clifton James (previous pt of Dr Riley Kill)

## 2012-03-22 NOTE — Assessment & Plan Note (Signed)
He should likely have lipid and liver.  I told him he could stop his Red Yeast Rice.

## 2012-03-22 NOTE — Progress Notes (Signed)
HPI:  He is doing well post CABG, and gradually increasing his activity.  He started rehab today.  No specific complaints. We reviewed his cath films.    Current Outpatient Prescriptions  Medication Sig Dispense Refill  . amiodarone (PACERONE) 200 MG tablet Take 0.5 tablets (100 mg total) by mouth daily.      Marland Kitchen aspirin EC 325 MG EC tablet Take 1 tablet (325 mg total) by mouth daily.  30 tablet    . atorvastatin (LIPITOR) 20 MG tablet Take 20 mg by mouth daily.      Marland Kitchen BEE POLLEN PO Take 2 capsules by mouth daily.      . metoprolol tartrate (LOPRESSOR) 25 MG tablet Take 0.5 tablets (12.5 mg total) by mouth 2 (two) times daily.  1 tablet  0  . Red Yeast Rice Extract (RED YEAST RICE PO) Take 1 tablet by mouth daily.      . Saw Palmetto, Serenoa repens, (SAW PALMETTO PO) Take 1 tablet by mouth daily.      . traMADol (ULTRAM) 50 MG tablet Take 1 tablet (50 mg total) by mouth every 6 (six) hours as needed for pain.  40 tablet  0   No current facility-administered medications for this visit.    Allergies  Allergen Reactions  . Niacin     REACTION: hives  . Simvastatin Other (See Comments)    myalgias    Past Medical History  Diagnosis Date  . 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 TIA 12/03/2007  . CEREBROVASCULAR ACCIDENT, HX OF 09/20/2007  . HYPERLIPIDEMIA 12/18/2006  . PERIPHERAL VASCULAR DISEASE 09/20/2007  . BRADYCARDIA 09/20/2007  . DIVERTICULOSIS, COLON 09/20/2007  . COLONIC POLYPS, HX OF 12/18/2006    Past Surgical History  Procedure Laterality Date  . Coronary artery bypass graft  02/10/2012    Procedure: CORONARY ARTERY BYPASS GRAFTING (CABG);  Surgeon: Kerin Perna, MD;  Location: [facility] OR;  Service: Open Heart Surgery;  Laterality: N/A;  CABG x three, using Bilateral mammary artery harvest and left arm radial harvest  . Intraoperative transesophageal echocardiogram  02/10/2012    Procedure: INTRAOPERATIVE TRANSESOPHAGEAL ECHOCARDIOGRAM;  Surgeon: Kerin Perna, MD;  Location: [facility]  OR;  Service: Open Heart Surgery;  Laterality: N/A;  . Radial artery harvest  02/10/2012    Procedure: RADIAL ARTERY HARVEST;  Surgeon: Kerin Perna, MD;  Location: [facility] OR;  Service: Open Heart Surgery;  Laterality: Left;    Family History  Problem Relation Age of Onset  . Heart disease Father   . Cancer Brother     History   Social History  . Marital Status: Married    Spouse Name: N/A    Number of Children: N/A  . Years of Education: N/A   Occupational History  . Not on file.   Social History Main Topics  . Smoking status: Never Smoker   . Smokeless tobacco: Never Used  . Alcohol Use: No  . Drug Use: No  . Sexually Active: Not on file   Other Topics Concern  . Not on file   Social History Narrative  . No narrative on file    ROS: Please see the HPI.  All other systems reviewed and negative.  PHYSICAL EXAM:  BP 134/70  Pulse 51  Ht 5' 9.25" (1.759 m)  Wt 179 lb 1.9 oz (81.248 kg)  BMI 26.26 kg/m2  SpO2 98%  General: Well developed, well nourished, in no acute distress. Head:  Normocephalic and atraumatic. Neck: no JVD Lungs: Clear  to auscultation and percussion. Sternotomy healing well.   Heart: Normal S1 and S2.  No murmur, rubs or gallops.  Extremities: No clubbing or cyanosis. No edema. Neurologic: Alert and oriented x 3.  EKG:  SB.  QTc is 428 ms.    ASSESSMENT AND PLAN:

## 2012-03-23 ENCOUNTER — Encounter (HOSPITAL_COMMUNITY): Payer: Medicare Other

## 2012-03-26 ENCOUNTER — Encounter (HOSPITAL_COMMUNITY): Payer: Medicare Other

## 2012-03-28 ENCOUNTER — Encounter (HOSPITAL_COMMUNITY)
Admission: RE | Admit: 2012-03-28 | Discharge: 2012-03-28 | Disposition: A | Discharge status: Home / Self Care | Payer: Medicare Other | Source: Ambulatory Visit | Attending: Cardiology | Admitting: Cardiology

## 2012-03-28 ENCOUNTER — Encounter (HOSPITAL_COMMUNITY): Payer: Medicare Other

## 2012-03-30 ENCOUNTER — Encounter (HOSPITAL_COMMUNITY): Payer: Medicare Other

## 2012-03-30 ENCOUNTER — Encounter (HOSPITAL_COMMUNITY)
Admission: RE | Admit: 2012-03-30 | Discharge: 2012-03-30 | Disposition: A | Discharge status: Home / Self Care | Payer: Medicare Other | Source: Ambulatory Visit | Attending: Cardiology | Admitting: Cardiology

## 2012-04-02 ENCOUNTER — Encounter (HOSPITAL_COMMUNITY): Payer: Medicare Other

## 2012-04-04 ENCOUNTER — Encounter (HOSPITAL_COMMUNITY): Payer: Medicare Other

## 2012-04-04 ENCOUNTER — Encounter (HOSPITAL_COMMUNITY)
Admission: RE | Admit: 2012-04-04 | Discharge: 2012-04-04 | Disposition: A | Discharge status: Home / Self Care | Payer: Medicare Other | Source: Ambulatory Visit | Attending: Cardiology | Admitting: Cardiology

## 2012-04-04 NOTE — Progress Notes (Signed)
Went over home exercise plan with pt. Pt stated that he has already been doing mobility exercises at home with no tenderness or tightness in his chest or incision site. Pt is eager to start walking/exercising outside more often to continue his efforts towards increasing his stamina. [id number]  Blima Dessert, MS, ACSM-CES

## 2012-04-06 ENCOUNTER — Encounter (HOSPITAL_COMMUNITY): Payer: Medicare Other

## 2012-04-06 ENCOUNTER — Encounter (HOSPITAL_COMMUNITY)
Admission: RE | Admit: 2012-04-06 | Discharge: 2012-04-06 | Disposition: A | Discharge status: Home / Self Care | Payer: Medicare Other | Source: Ambulatory Visit | Attending: Cardiology | Admitting: Cardiology

## 2012-04-09 ENCOUNTER — Encounter (HOSPITAL_COMMUNITY)
Admission: RE | Admit: 2012-04-09 | Discharge: 2012-04-09 | Disposition: A | Discharge status: Home / Self Care | Payer: Medicare Other | Source: Ambulatory Visit | Attending: Cardiology | Admitting: Cardiology

## 2012-04-09 ENCOUNTER — Encounter (HOSPITAL_COMMUNITY): Payer: Medicare Other

## 2012-04-11 ENCOUNTER — Encounter (HOSPITAL_COMMUNITY): Payer: Medicare Other

## 2012-04-11 ENCOUNTER — Encounter (HOSPITAL_COMMUNITY)
Admission: RE | Admit: 2012-04-11 | Discharge: 2012-04-11 | Disposition: A | Discharge status: Home / Self Care | Payer: Medicare Other | Source: Ambulatory Visit | Attending: Cardiology | Admitting: Cardiology

## 2012-04-12 ENCOUNTER — Other Ambulatory Visit: Payer: Self-pay | Admitting: Physician Assistant

## 2012-04-13 ENCOUNTER — Encounter (HOSPITAL_COMMUNITY): Payer: Medicare Other

## 2012-04-13 ENCOUNTER — Encounter (HOSPITAL_COMMUNITY)
Admission: RE | Admit: 2012-04-13 | Discharge: 2012-04-13 | Disposition: A | Discharge status: Home / Self Care | Payer: Medicare Other | Source: Ambulatory Visit | Attending: Cardiology | Admitting: Cardiology

## 2012-04-16 ENCOUNTER — Encounter (HOSPITAL_COMMUNITY): Payer: Medicare Other

## 2012-04-18 ENCOUNTER — Encounter (HOSPITAL_COMMUNITY)
Admission: RE | Admit: 2012-04-18 | Discharge: 2012-04-18 | Disposition: A | Discharge status: Home / Self Care | Payer: Medicare Other | Source: Ambulatory Visit | Attending: Cardiology | Admitting: Cardiology

## 2012-04-18 ENCOUNTER — Encounter (HOSPITAL_COMMUNITY): Payer: Medicare Other

## 2012-04-18 DIAGNOSIS — I2510 Atherosclerotic heart disease of native coronary artery without angina pectoris: Secondary | ICD-10-CM | POA: Insufficient documentation

## 2012-04-18 DIAGNOSIS — Z951 Presence of aortocoronary bypass graft: Secondary | ICD-10-CM | POA: Insufficient documentation

## 2012-04-18 DIAGNOSIS — Z5189 Encounter for other specified aftercare: Secondary | ICD-10-CM | POA: Insufficient documentation

## 2012-04-18 NOTE — Progress Notes (Signed)
[name] [name] 72 y.o. male Nutrition Note Spoke with pt.  Nutrition Plan and Nutrition Survey goals reviewed with pt. Pt is following Step 2 of the Therapeutic Lifestyle Changes diet. Pt expressed understanding of the information reviewed. Pt aware of nutrition education classes offered and is unable to attend nutrition classes.  Nutrition Diagnosis   Food-and nutrition-related knowledge deficit related to lack of exposure to information as related to diagnosis of: ? CVD   Nutrition RX/ Estimated Daily Nutrition Needs for: wt maintenance 2200-2500 Kcal, 70-80 gm fat, 14-17 gm sat fat, 2.2-2.5 gm trans-fat, <1500 mg sodium   Nutrition Intervention   Pt's individual nutrition plan including cholesterol goals reviewed with pt.   Benefits of adopting Therapeutic Lifestyle Changes discussed when Medficts reviewed.   Pt to attend the Portion Distortion class   Pt given handouts for: ? Nutrition I class ? Nutrition II class    Continue client-centered nutrition education by RD, as part of interdisciplinary care.  Goal(s)   Pt to describe the benefit of including fruits, vegetables, whole grains, and low-fat dairy products in a heart healthy meal plan.  Monitor and Evaluate progress toward nutrition goal with team. Nutrition Risk:  Low   Mickle Plumb, M.Ed, RD, LDN, CDE 04/18/2012 3:43 PM

## 2012-04-20 ENCOUNTER — Encounter (HOSPITAL_COMMUNITY): Payer: Medicare Other

## 2012-04-20 ENCOUNTER — Encounter (HOSPITAL_COMMUNITY)
Admission: RE | Admit: 2012-04-20 | Discharge: 2012-04-20 | Disposition: A | Discharge status: Home / Self Care | Payer: Medicare Other | Source: Ambulatory Visit | Attending: Cardiology | Admitting: Cardiology

## 2012-04-23 ENCOUNTER — Encounter (HOSPITAL_COMMUNITY): Payer: Medicare Other

## 2012-04-25 ENCOUNTER — Encounter (HOSPITAL_COMMUNITY): Payer: Medicare Other

## 2012-04-25 ENCOUNTER — Encounter (HOSPITAL_COMMUNITY)
Admission: RE | Admit: 2012-04-25 | Discharge: 2012-04-25 | Disposition: A | Discharge status: Home / Self Care | Payer: Medicare Other | Source: Ambulatory Visit | Attending: Cardiology | Admitting: Cardiology

## 2012-04-27 ENCOUNTER — Encounter (HOSPITAL_COMMUNITY): Payer: Medicare Other

## 2012-04-30 ENCOUNTER — Encounter (HOSPITAL_COMMUNITY): Payer: Medicare Other

## 2012-04-30 ENCOUNTER — Encounter (HOSPITAL_COMMUNITY)
Admission: RE | Admit: 2012-04-30 | Discharge: 2012-04-30 | Disposition: A | Discharge status: Home / Self Care | Payer: Medicare Other | Source: Ambulatory Visit | Attending: Cardiology | Admitting: Cardiology

## 2012-05-02 ENCOUNTER — Encounter (HOSPITAL_COMMUNITY)
Admission: RE | Admit: 2012-05-02 | Discharge: 2012-05-02 | Disposition: A | Discharge status: Home / Self Care | Payer: Medicare Other | Source: Ambulatory Visit | Attending: Cardiology | Admitting: Cardiology

## 2012-05-02 ENCOUNTER — Encounter (HOSPITAL_COMMUNITY): Payer: Medicare Other

## 2012-05-04 ENCOUNTER — Encounter (HOSPITAL_COMMUNITY): Payer: Medicare Other

## 2012-05-07 ENCOUNTER — Encounter (HOSPITAL_COMMUNITY): Payer: Medicare Other

## 2012-05-09 ENCOUNTER — Encounter (HOSPITAL_COMMUNITY): Payer: Medicare Other

## 2012-05-09 ENCOUNTER — Encounter (HOSPITAL_COMMUNITY)
Admission: RE | Admit: 2012-05-09 | Discharge: 2012-05-09 | Disposition: A | Discharge status: Home / Self Care | Payer: Medicare Other | Source: Ambulatory Visit | Attending: Cardiology | Admitting: Cardiology

## 2012-05-11 ENCOUNTER — Encounter (HOSPITAL_COMMUNITY)
Admission: RE | Admit: 2012-05-11 | Discharge: 2012-05-11 | Disposition: A | Discharge status: Home / Self Care | Payer: Medicare Other | Source: Ambulatory Visit | Attending: Cardiology | Admitting: Cardiology

## 2012-05-11 ENCOUNTER — Encounter (HOSPITAL_COMMUNITY): Payer: Medicare Other

## 2012-05-14 ENCOUNTER — Encounter (HOSPITAL_COMMUNITY): Payer: Medicare Other

## 2012-05-15 ENCOUNTER — Encounter: Payer: Self-pay | Admitting: Cardiovascular Disease

## 2012-05-15 ENCOUNTER — Ambulatory Visit (INDEPENDENT_AMBULATORY_CARE_PROVIDER_SITE_OTHER): Payer: Medicare Other | Admitting: Cardiovascular Disease

## 2012-05-15 VITALS — BP 136/68 | HR 56 | Ht 70.0 in | Wt 185.0 lb

## 2012-05-15 DIAGNOSIS — E785 Hyperlipidemia, unspecified: Secondary | ICD-10-CM

## 2012-05-15 DIAGNOSIS — I2510 Atherosclerotic heart disease of native coronary artery without angina pectoris: Secondary | ICD-10-CM

## 2012-05-15 MED ORDER — ASPIRIN EC 81 MG PO TBEC: 81.0000 mg | DELAYED_RELEASE_TABLET | Freq: Every day | ORAL | Status: DC

## 2012-05-15 NOTE — Patient Instructions (Addendum)
Your physician recommends that you schedule a follow-up appointment in: 3-4 months. --August 23, 2012 at 1:45  Your physician has recommended you make the following change in your medication: Decrease aspirin to 81 mg by mouth daily

## 2012-05-15 NOTE — Progress Notes (Signed)
History of Present Illness: 72 yo male with history of CAD s/p 3V CABG 02/10/12 (LIMA to LAD, RIMA to RCA, free radial to OM), hyperlipidemia, prostate cancer, CVA here today for cardiac follow up. He has been followed in the past by Dr. Riley Kill. He presented with unstable angina in January 2014. Cardiac cath 02/10/12 with 99% ostial left main stenosis, 99% ostial RCA stenosis. 3V CABG on 02/10/12. He has done well following his CABG.   He feels well. No chest pain or SOB. He has some hoarseness following extubation. No lower ext edema.   Primary Care Physician: Oliver Barre  Last Lipid Profile:Lipid Panel     Component Value Date/Time   CHOL 111 03/22/2012 1202   TRIG 66.0 03/22/2012 1202   HDL 41.60 03/22/2012 1202   CHOLHDL 3 03/22/2012 1202   VLDL 13.2 03/22/2012 1202   LDLCALC 56 03/22/2012 1202     Past Medical History  Diagnosis Date  . 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 TIA 12/03/2007  . CEREBROVASCULAR ACCIDENT, HX OF 09/20/2007  . HYPERLIPIDEMIA 12/18/2006  . PERIPHERAL VASCULAR DISEASE 09/20/2007  . BRADYCARDIA 09/20/2007  . DIVERTICULOSIS, COLON 09/20/2007  . COLONIC POLYPS, HX OF 12/18/2006    Past Surgical History  Procedure Laterality Date  . Coronary artery bypass graft  02/10/2012    Procedure: CORONARY ARTERY BYPASS GRAFTING (CABG);  Surgeon: Kerin Perna, MD;  Location: [facility] OR;  Service: Open Heart Surgery;  Laterality: N/A;  CABG x three, using Bilateral mammary artery harvest and left arm radial harvest  . Intraoperative transesophageal echocardiogram  02/10/2012    Procedure: INTRAOPERATIVE TRANSESOPHAGEAL ECHOCARDIOGRAM;  Surgeon: Kerin Perna, MD;  Location: [facility] OR;  Service: Open Heart Surgery;  Laterality: N/A;  . Radial artery harvest  02/10/2012    Procedure: RADIAL ARTERY HARVEST;  Surgeon: Kerin Perna, MD;  Location: [facility] OR;  Service: Open Heart Surgery;  Laterality: Left;    Current Outpatient Prescriptions  Medication Sig Dispense Refill  . aspirin EC 325 MG  EC tablet Take 1 tablet (325 mg total) by mouth daily.  30 tablet    . atorvastatin (LIPITOR) 20 MG tablet Take 20 mg by mouth daily.      Marland Kitchen BEE POLLEN PO Take by mouth 2 (two) times daily.      . Red Yeast Rice Extract (RED YEAST RICE PO) Take 1 tablet by mouth daily.      . Saw Palmetto, Serenoa repens, (SAW PALMETTO PO) Take 1 tablet by mouth daily.       No current facility-administered medications for this visit.    Allergies  Allergen Reactions  . Niacin     REACTION: hives  . Simvastatin Other (See Comments)    myalgias    History   Social History  . Marital Status: Married    Spouse Name: N/A    Number of Children: N/A  . Years of Education: N/A   Occupational History  . Not on file.   Social History Main Topics  . Smoking status: Never Smoker   . Smokeless tobacco: Never Used  . Alcohol Use: No  . Drug Use: No  . Sexually Active: Not on file   Other Topics Concern  . Not on file   Social History Narrative  . No narrative on file    Family History  Problem Relation Age of Onset  . Heart disease Father   . Cancer Brother     Review of Systems:  As stated in the  HPI and otherwise negative.   BP 136/68  Pulse 56  Ht 5\' 10"  (1.778 m)  Wt 185 lb (83.915 kg)  BMI 26.54 kg/m2  Physical Examination: General: Well developed, well nourished, NAD HEENT: OP clear, mucus membranes moist SKIN: warm, dry. No rashes. Neuro: No focal deficits Musculoskeletal: Muscle strength 5/5 all ext Psychiatric: Mood and affect normal Neck: No JVD, no carotid bruits, no thyromegaly, no lymphadenopathy. Lungs:Clear bilaterally, no wheezes, rhonci, crackles Cardiovascular: Huston Foley, regular. No murmurs, gallops or rubs. Abdomen:Soft. Bowel sounds present. Non-tender.  Extremities: No lower extremity edema. Pulses are 2 + in the bilateral DP/PT.  Cardiac cath 02/10/12: Left main: Distal 99% stenosis.  Left Anterior Descending Artery: Large caliber vessel that courses to the  apex. There is calcification in the proximal vessel. The proximal vessel has a 90% stenosis. The mid vessel has serial 50% stenoses. The distal vessel has mild plaque disease. The diagonal branch is small to moderate sized and has mild plaque disease.  Circumflex Artery: Large caliber, calcified vessel with 30% stenosis in the proximal vessel. The obtuse marginal is moderate in caliber and has 30% serial stenoses.  Right Coronary Artery: Large, dominant vessel with 99% ostial stenosis. The mid vessel has serial 30% stenoses with moderate calcification. The distal vessel has a 40-50% stenosis just before the bifurcation. The PDA and posterolateral branches are moderate in caliber with diffuse plaque but no focally obstructive lesions.  Left Ventricular Angiogram: LVEF=60-65%.   Assessment and Plan:   1. CAD: Stable. Continue ASA and statin. He did not tolerate beta blockers secondary to bradycardia. Will lower ASA to 81 mg po Qdaily. Sternal wound has healed well. He is cleared for golf.   2. Hyperlipidemia: Continue statin.

## 2012-05-16 ENCOUNTER — Encounter (HOSPITAL_COMMUNITY)
Admission: RE | Admit: 2012-05-16 | Discharge: 2012-05-16 | Disposition: A | Discharge status: Home / Self Care | Payer: Medicare Other | Source: Ambulatory Visit | Attending: Cardiology | Admitting: Cardiology

## 2012-05-16 ENCOUNTER — Encounter (HOSPITAL_COMMUNITY): Payer: Medicare Other

## 2012-05-18 ENCOUNTER — Encounter (HOSPITAL_COMMUNITY)
Admission: RE | Admit: 2012-05-18 | Discharge: 2012-05-18 | Disposition: A | Discharge status: Home / Self Care | Payer: Medicare Other | Source: Ambulatory Visit | Attending: Cardiology | Admitting: Cardiology

## 2012-05-18 ENCOUNTER — Encounter (HOSPITAL_COMMUNITY): Payer: Medicare Other

## 2012-05-18 DIAGNOSIS — Z5189 Encounter for other specified aftercare: Secondary | ICD-10-CM | POA: Insufficient documentation

## 2012-05-18 DIAGNOSIS — Z951 Presence of aortocoronary bypass graft: Secondary | ICD-10-CM | POA: Insufficient documentation

## 2012-05-18 DIAGNOSIS — I2510 Atherosclerotic heart disease of native coronary artery without angina pectoris: Secondary | ICD-10-CM | POA: Insufficient documentation

## 2012-05-21 ENCOUNTER — Encounter (HOSPITAL_COMMUNITY): Payer: Medicare Other

## 2012-05-23 ENCOUNTER — Encounter (HOSPITAL_COMMUNITY)
Admission: RE | Admit: 2012-05-23 | Discharge: 2012-05-23 | Disposition: A | Discharge status: Home / Self Care | Payer: Medicare Other | Source: Ambulatory Visit | Attending: Cardiology | Admitting: Cardiology

## 2012-05-23 ENCOUNTER — Encounter (HOSPITAL_COMMUNITY): Payer: Medicare Other

## 2012-05-25 ENCOUNTER — Encounter (HOSPITAL_COMMUNITY): Payer: Medicare Other

## 2012-05-25 ENCOUNTER — Encounter (HOSPITAL_COMMUNITY)
Admission: RE | Admit: 2012-05-25 | Discharge: 2012-05-25 | Disposition: A | Discharge status: Home / Self Care | Payer: Medicare Other | Source: Ambulatory Visit | Attending: Cardiology | Admitting: Cardiology

## 2012-05-28 ENCOUNTER — Encounter (HOSPITAL_COMMUNITY): Payer: Medicare Other

## 2012-05-30 ENCOUNTER — Encounter (HOSPITAL_COMMUNITY): Payer: Medicare Other

## 2012-05-30 ENCOUNTER — Encounter (HOSPITAL_COMMUNITY)
Admission: RE | Admit: 2012-05-30 | Discharge: 2012-05-30 | Disposition: A | Discharge status: Home / Self Care | Payer: Medicare Other | Source: Ambulatory Visit | Attending: Cardiology | Admitting: Cardiology

## 2012-06-01 ENCOUNTER — Encounter (HOSPITAL_COMMUNITY)
Admission: RE | Admit: 2012-06-01 | Discharge: 2012-06-01 | Disposition: A | Discharge status: Home / Self Care | Payer: Medicare Other | Source: Ambulatory Visit | Attending: Cardiology | Admitting: Cardiology

## 2012-06-01 ENCOUNTER — Encounter (HOSPITAL_COMMUNITY): Payer: Medicare Other

## 2012-06-04 ENCOUNTER — Encounter (HOSPITAL_COMMUNITY): Payer: Medicare Other

## 2012-06-06 ENCOUNTER — Encounter (HOSPITAL_COMMUNITY)
Admission: RE | Admit: 2012-06-06 | Discharge: 2012-06-06 | Disposition: A | Discharge status: Home / Self Care | Payer: Medicare Other | Source: Ambulatory Visit | Attending: Cardiology | Admitting: Cardiology

## 2012-06-06 ENCOUNTER — Encounter (HOSPITAL_COMMUNITY): Payer: Medicare Other

## 2012-06-08 ENCOUNTER — Encounter (HOSPITAL_COMMUNITY): Payer: Medicare Other

## 2012-06-11 ENCOUNTER — Encounter (HOSPITAL_COMMUNITY): Payer: Medicare Other

## 2012-06-13 ENCOUNTER — Encounter (HOSPITAL_COMMUNITY)
Admission: RE | Admit: 2012-06-13 | Discharge: 2012-06-13 | Disposition: A | Discharge status: Home / Self Care | Payer: Medicare Other | Source: Ambulatory Visit | Attending: Cardiology | Admitting: Cardiology

## 2012-06-13 ENCOUNTER — Encounter (HOSPITAL_COMMUNITY): Payer: Medicare Other

## 2012-06-13 NOTE — Progress Notes (Signed)
Doug plans to graduate this Friday and will continue exercise on his own.

## 2012-06-15 ENCOUNTER — Encounter (HOSPITAL_COMMUNITY)
Admission: RE | Admit: 2012-06-15 | Discharge: 2012-06-15 | Disposition: A | Discharge status: Home / Self Care | Payer: Medicare Other | Source: Ambulatory Visit | Attending: Cardiology | Admitting: Cardiology

## 2012-06-15 ENCOUNTER — Encounter (HOSPITAL_COMMUNITY): Admission: RE | Admit: 2012-06-15 | Payer: Medicare Other | Source: Ambulatory Visit

## 2012-06-18 ENCOUNTER — Encounter (HOSPITAL_COMMUNITY): Payer: Medicare Other

## 2012-06-20 ENCOUNTER — Encounter (HOSPITAL_COMMUNITY): Payer: Medicare Other

## 2012-06-22 ENCOUNTER — Encounter (HOSPITAL_COMMUNITY): Payer: Medicare Other

## 2012-08-22 ENCOUNTER — Encounter: Payer: Self-pay | Admitting: Cardiovascular Disease

## 2012-08-23 ENCOUNTER — Encounter: Payer: Self-pay | Admitting: Cardiovascular Disease

## 2012-08-23 ENCOUNTER — Ambulatory Visit (INDEPENDENT_AMBULATORY_CARE_PROVIDER_SITE_OTHER): Payer: Self-pay | Admitting: Cardiovascular Disease

## 2012-08-23 VITALS — BP 128/74 | HR 62 | Ht 70.0 in | Wt 186.0 lb

## 2012-08-23 DIAGNOSIS — E785 Hyperlipidemia, unspecified: Secondary | ICD-10-CM

## 2012-08-23 DIAGNOSIS — I2510 Atherosclerotic heart disease of native coronary artery without angina pectoris: Secondary | ICD-10-CM

## 2012-08-23 MED ORDER — NITROGLYCERIN 0.4 MG SL SUBL: 0.4000 mg | SUBLINGUAL_TABLET | SUBLINGUAL | Status: DC | PRN

## 2012-08-23 NOTE — Progress Notes (Signed)
History of Present Illness: 72 yo male with history of CAD s/p 3V CABG 02/10/12 (LIMA to LAD, RIMA to RCA, free radial to OM), hyperlipidemia, prostate cancer, CVA here today for cardiac follow up. He has been followed in the past by Dr. Riley Kill. He presented with unstable angina in January 2014. Cardiac cath 02/10/12 with 99% ostial left main stenosis, 99% ostial RCA stenosis. 3V CABG on 02/10/12. He has done well following his CABG.   He is here today for follow up. He feels well. No chest pain or SOB. He is active.   Primary Care Physician: Oliver Barre   Last Lipid Profile:Lipid Panel     Component Value Date/Time   CHOL 111 03/22/2012 1202   TRIG 66.0 03/22/2012 1202   HDL 41.60 03/22/2012 1202   CHOLHDL 3 03/22/2012 1202   VLDL 13.2 03/22/2012 1202   LDLCALC 56 03/22/2012 1202     Past Medical History  Diagnosis Date  . 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 TIA 12/03/2007  . CEREBROVASCULAR ACCIDENT, HX OF 09/20/2007  . HYPERLIPIDEMIA 12/18/2006  . PERIPHERAL VASCULAR DISEASE 09/20/2007  . BRADYCARDIA 09/20/2007  . DIVERTICULOSIS, COLON 09/20/2007  . COLONIC POLYPS, HX OF 12/18/2006  . CAD (coronary artery disease)     Past Surgical History  Procedure Laterality Date  . Coronary artery bypass graft  02/10/2012    Procedure: CORONARY ARTERY BYPASS GRAFTING (CABG);  Surgeon: Kerin Perna, MD;  Location: [facility] OR;  Service: Open Heart Surgery;  Laterality: N/A;  CABG x three, using Bilateral mammary artery harvest and left arm radial harvest  . Intraoperative transesophageal echocardiogram  02/10/2012    Procedure: INTRAOPERATIVE TRANSESOPHAGEAL ECHOCARDIOGRAM;  Surgeon: Kerin Perna, MD;  Location: [facility] OR;  Service: Open Heart Surgery;  Laterality: N/A;  . Radial artery harvest  02/10/2012    Procedure: RADIAL ARTERY HARVEST;  Surgeon: Kerin Perna, MD;  Location: [facility] OR;  Service: Open Heart Surgery;  Laterality: Left;    Current Outpatient Prescriptions  Medication Sig Dispense Refill  .  aspirin 81 MG tablet Take 1 tablet (81 mg total) by mouth daily.  30 tablet  6  . atorvastatin (LIPITOR) 20 MG tablet Take 20 mg by mouth daily.      Marland Kitchen BEE POLLEN PO Take by mouth 2 (two) times daily.      . Red Yeast Rice Extract (RED YEAST RICE PO) Take 1 tablet by mouth daily.      . Saw Palmetto, Serenoa repens, (SAW PALMETTO PO) Take 1 tablet by mouth daily.       No current facility-administered medications for this visit.    Allergies  Allergen Reactions  . Niacin     REACTION: hives  . Simvastatin Other (See Comments)    myalgias    History   Social History  . Marital Status: Married    Spouse Name: N/A    Number of Children: N/A  . Years of Education: N/A   Occupational History  . Not on file.   Social History Main Topics  . Smoking status: Never Smoker   . Smokeless tobacco: Never Used  . Alcohol Use: No  . Drug Use: No  . Sexually Active: Not on file   Other Topics Concern  . Not on file   Social History Narrative  . No narrative on file    Family History  Problem Relation Age of Onset  . Heart disease Father   . Cancer Brother     Review of  Systems:  As stated in the HPI and otherwise negative.   BP 128/74  Pulse 62  Ht 5\' 10"  (1.778 m)  Wt 186 lb (84.369 kg)  BMI 26.69 kg/m2  SpO2 98%  Physical Examination: General: Well developed, well nourished, NAD HEENT: OP clear, mucus membranes moist SKIN: warm, dry. No rashes. Neuro: No focal deficits Musculoskeletal: Muscle strength 5/5 all ext Psychiatric: Mood and affect normal Neck: No JVD, no carotid bruits, no thyromegaly, no lymphadenopathy. Lungs:Clear bilaterally, no wheezes, rhonci, crackles Cardiovascular: Regular rate and rhythm. No murmurs, gallops or rubs. Abdomen:Soft. Bowel sounds present. Non-tender.  Extremities: No lower extremity edema. Pulses are 2 + in the bilateral DP/PT.  Cardiac cath 02/10/12:  Left main: Distal 99% stenosis.  Left Anterior Descending Artery: Large  caliber vessel that courses to the apex. There is calcification in the proximal vessel. The proximal vessel has a 90% stenosis. The mid vessel has serial 50% stenoses. The distal vessel has mild plaque disease. The diagonal branch is small to moderate sized and has mild plaque disease.  Circumflex Artery: Large caliber, calcified vessel with 30% stenosis in the proximal vessel. The obtuse marginal is moderate in caliber and has 30% serial stenoses.  Right Coronary Artery: Large, dominant vessel with 99% ostial stenosis. The mid vessel has serial 30% stenoses with moderate calcification. The distal vessel has a 40-50% stenosis just before the bifurcation. The PDA and posterolateral branches are moderate in caliber with diffuse plaque but no focally obstructive lesions.  Left Ventricular Angiogram: LVEF=60-65%.   Assessment and Plan:   1. CAD: Stable. Continue ASA and statin. He did not tolerate beta blockers secondary to bradycardia.   2. Hyperlipidemia: Continue statin.

## 2012-08-23 NOTE — Patient Instructions (Addendum)
Your physician wants you to follow-up in:  6 months. You will receive a reminder letter in the mail two months in advance. If you don't receive a letter, please call our office to schedule the follow-up appointment.   

## 2013-02-20 ENCOUNTER — Other Ambulatory Visit: Payer: Self-pay | Admitting: *Deleted

## 2013-02-20 MED ORDER — ATORVASTATIN CALCIUM 20 MG PO TABS: 20.0000 mg | ORAL_TABLET | Freq: Every day | ORAL | Status: DC

## 2013-02-22 ENCOUNTER — Ambulatory Visit (INDEPENDENT_AMBULATORY_CARE_PROVIDER_SITE_OTHER): Payer: Medicare Other | Admitting: Cardiovascular Disease

## 2013-02-22 ENCOUNTER — Encounter: Payer: Self-pay | Admitting: Cardiovascular Disease

## 2013-02-22 VITALS — BP 120/84 | HR 54 | Ht 70.0 in | Wt 188.0 lb

## 2013-02-22 DIAGNOSIS — E785 Hyperlipidemia, unspecified: Secondary | ICD-10-CM

## 2013-02-22 DIAGNOSIS — I2510 Atherosclerotic heart disease of native coronary artery without angina pectoris: Secondary | ICD-10-CM

## 2013-02-22 DIAGNOSIS — I25119 Atherosclerotic heart disease of native coronary artery with unspecified angina pectoris: Secondary | ICD-10-CM

## 2013-02-22 DIAGNOSIS — I209 Angina pectoris, unspecified: Secondary | ICD-10-CM

## 2013-02-22 LAB — HEPATIC FUNCTION PANEL
ALBUMIN: 4.1 g/dL (ref 3.5–5.2)
ALT: 18 U/L (ref 0–53)
AST: 27 U/L (ref 0–37)
Alkaline Phosphatase: 64 U/L (ref 39–117)
BILIRUBIN TOTAL: 0.9 mg/dL (ref 0.3–1.2)
Bilirubin, Direct: 0 mg/dL (ref 0.0–0.3)
Total Protein: 7.3 g/dL (ref 6.0–8.3)

## 2013-02-22 LAB — LIPID PANEL
CHOL/HDL RATIO: 4
Cholesterol: 185 mg/dL (ref 0–200)
HDL: 42.5 mg/dL (ref >39.00)
LDL Cholesterol: 124 mg/dL — ABNORMAL HIGH (ref 0–99)
TRIGLYCERIDES: 94 mg/dL (ref 0.0–149.0)
VLDL: 18.8 mg/dL (ref 0.0–40.0)

## 2013-02-22 NOTE — Patient Instructions (Signed)
Your physician wants you to follow-up in:  12 months.  You will receive a reminder letter in the mail two months in advance. If you don't receive a letter, please call our office to schedule the follow-up appointment.   

## 2013-02-22 NOTE — Progress Notes (Signed)
History of Present Illness: 73 yo male with history of CAD s/p 3V CABG 02/10/12 (LIMA to LAD, RIMA to RCA, free radial to OM), hyperlipidemia, prostate cancer, CVA here today for cardiac follow up. He has been followed in the past by Dr. Lia Foyer. He presented with unstable angina in January 2014. Cardiac cath 02/10/12 with 99% ostial left main stenosis, 99% ostial RCA stenosis. 3V CABG on 02/10/12. He has done well following his CABG.   He is here today for follow up. He feels well. No chest pain or SOB. He is active. 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   Primary Care Physician: Cathlean Cower   Last Lipid Profile:Lipid Panel     Component Value Date/Time   CHOL 111 03/22/2012 1202   TRIG 66.0 03/22/2012 1202   HDL 41.60 03/22/2012 1202   CHOLHDL 3 03/22/2012 1202   VLDL 13.2 03/22/2012 1202   LDLCALC 56 03/22/2012 1202    Past Medical History  Diagnosis Date  . 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 TIA 12/03/2007  . CEREBROVASCULAR ACCIDENT, HX OF 09/20/2007  . HYPERLIPIDEMIA 12/18/2006  . PERIPHERAL VASCULAR DISEASE 09/20/2007  . BRADYCARDIA 09/20/2007  . DIVERTICULOSIS, COLON 09/20/2007  . COLONIC POLYPS, HX OF 12/18/2006  . CAD (coronary artery disease)     Past Surgical History  Procedure Laterality Date  . Coronary artery bypass graft  02/10/2012    Procedure: CORONARY ARTERY BYPASS GRAFTING (CABG);  Surgeon: Ivin Poot, MD;  Location: [place];  Service: Open Heart Surgery;  Laterality: N/A;  CABG x three, using Bilateral mammary artery harvest and left arm radial harvest  . Intraoperative transesophageal echocardiogram  02/10/2012    Procedure: INTRAOPERATIVE TRANSESOPHAGEAL ECHOCARDIOGRAM;  Surgeon: Ivin Poot, MD;  Location: [place];  Service: Open Heart Surgery;  Laterality: N/A;  . Radial artery harvest  02/10/2012    Procedure: RADIAL ARTERY HARVEST;  Surgeon: Ivin Poot, MD;  Location: [place];  Service: Open Heart Surgery;  Laterality: Left;    Current Outpatient  Prescriptions  Medication Sig Dispense Refill  . aspirin 81 MG tablet Take 1 tablet (81 mg total) by mouth daily.  30 tablet  6  . atorvastatin (LIPITOR) 20 MG tablet Take 1 tablet (20 mg total) by mouth daily.  30 tablet  0  . BEE POLLEN PO Take by mouth 2 (two) times daily.      . nitroGLYCERIN (NITROSTAT) 0.4 MG SL tablet Place 1 tablet (0.4 mg total) under the tongue every 5 (five) minutes as needed for chest pain.  25 tablet  6  . Red Yeast Rice Extract (RED YEAST RICE PO) Take 1 tablet by mouth daily.      . Saw Palmetto, Serenoa repens, (SAW PALMETTO PO) Take 1 tablet by mouth daily.       No current facility-administered medications for this visit.    Allergies  Allergen Reactions  . Niacin     REACTION: hives  . Simvastatin Other (See Comments)    myalgias    History   Social History  . Marital Status: Married    Spouse Name: N/A    Number of Children: N/A  . Years of Education: N/A   Occupational History  . Not on file.   Social History Main Topics  . Smoking status: Never Smoker   . Smokeless tobacco: Never Used  . Alcohol Use: No  . Drug Use: No  . Sexual Activity: Not on file   Other Topics  Concern  . Not on file   Social History Narrative  . No narrative on file    Family History  Problem Relation Age of Onset  . Heart disease Father   . Cancer Brother     Review of Systems:  As stated in the HPI and otherwise negative.   BP 120/84  Pulse 54  Ht 5\' 10"  (1.778 m)  Wt 188 lb (85.276 kg)  BMI 26.98 kg/m2  Physical Examination: General: Well developed, well nourished, NAD HEENT: OP clear, mucus membranes moist SKIN: warm, dry. No rashes. Neuro: No focal deficits Musculoskeletal: Muscle strength 5/5 all ext Psychiatric: Mood and affect normal Neck: No JVD, no carotid bruits, no thyromegaly, no lymphadenopathy. Lungs:Clear bilaterally, no wheezes, rhonci, crackles Cardiovascular: Regular rate and rhythm. No murmurs, gallops or  rubs. Abdomen:Soft. Bowel sounds present. Non-tender.  Extremities: No lower extremity edema. Pulses are 2 + in the bilateral DP/PT.  Cardiac cath 02/10/12:  Left main: Distal 99% stenosis.  Left Anterior Descending Artery: Large caliber vessel that courses to the apex. There is calcification in the proximal vessel. The proximal vessel has a 90% stenosis. The mid vessel has serial 50% stenoses. The distal vessel has mild plaque disease. The diagonal branch is small to moderate sized and has mild plaque disease.  Circumflex Artery: Large caliber, calcified vessel with 30% stenosis in the proximal vessel. The obtuse marginal is moderate in caliber and has 30% serial stenoses.  Right Coronary Artery: Large, dominant vessel with 99% ostial stenosis. The mid vessel has serial 30% stenoses with moderate calcification. The distal vessel has a 40-50% stenosis just before the bifurcation. The PDA and posterolateral branches are moderate in caliber with diffuse plaque but no focally obstructive lesions.  Left Ventricular Angiogram: LVEF=60-65%.   Assessment and Plan:   1. CAD: Stable. Continue ASA and statin. He did not tolerate beta blockers secondary to bradycardia.   2. Hyperlipidemia: Continue statin. Check lipids and LFTs today.

## 2013-03-01 ENCOUNTER — Telehealth: Payer: Self-pay | Admitting: Cardiovascular Disease

## 2013-03-01 DIAGNOSIS — E785 Hyperlipidemia, unspecified: Secondary | ICD-10-CM

## 2013-03-01 MED ORDER — ATORVASTATIN CALCIUM 40 MG PO TABS: 40.0000 mg | ORAL_TABLET | Freq: Every day | ORAL | Status: DC

## 2013-03-01 NOTE — Telephone Encounter (Signed)
New message ° ° ° ° °Want test results °

## 2013-03-01 NOTE — Telephone Encounter (Signed)
Spoke with pt and reviewed lipid and liver results with him.  He will increase Atorvastatin to 40 mg by mouth daily and come to office for fasting lab work the week of May 20, 2013.  Will send prescription to [facility] on [address]

## 2013-05-21 ENCOUNTER — Other Ambulatory Visit (INDEPENDENT_AMBULATORY_CARE_PROVIDER_SITE_OTHER): Payer: Medicare Other

## 2013-05-21 DIAGNOSIS — E785 Hyperlipidemia, unspecified: Secondary | ICD-10-CM

## 2013-05-21 LAB — HEPATIC FUNCTION PANEL
ALBUMIN: 4 g/dL (ref 3.5–5.2)
ALK PHOS: 61 U/L (ref 39–117)
ALT: 19 U/L (ref 0–53)
AST: 26 U/L (ref 0–37)
BILIRUBIN DIRECT: 0.1 mg/dL (ref 0.0–0.3)
TOTAL PROTEIN: 7 g/dL (ref 6.0–8.3)
Total Bilirubin: 0.4 mg/dL (ref 0.2–1.2)

## 2013-05-21 LAB — LIPID PANEL
CHOLESTEROL: 180 mg/dL (ref 0–200)
HDL: 43.3 mg/dL (ref >39.00)
LDL CALC: 123 mg/dL — AB (ref 0–99)
TRIGLYCERIDES: 68 mg/dL (ref 0.0–149.0)
Total CHOL/HDL Ratio: 4
VLDL: 13.6 mg/dL (ref 0.0–40.0)

## 2013-05-24 ENCOUNTER — Other Ambulatory Visit: Payer: Self-pay | Admitting: *Deleted

## 2013-05-24 DIAGNOSIS — E785 Hyperlipidemia, unspecified: Secondary | ICD-10-CM

## 2013-05-24 MED ORDER — ATORVASTATIN CALCIUM 40 MG PO TABS: 40.0000 mg | ORAL_TABLET | Freq: Every day | ORAL | Status: DC

## 2013-08-12 ENCOUNTER — Other Ambulatory Visit (INDEPENDENT_AMBULATORY_CARE_PROVIDER_SITE_OTHER): Payer: Medicare Other

## 2013-08-12 DIAGNOSIS — E785 Hyperlipidemia, unspecified: Secondary | ICD-10-CM

## 2013-08-12 LAB — LIPID PANEL
CHOLESTEROL: 134 mg/dL (ref 0–200)
HDL: 41.8 mg/dL (ref >39.00)
LDL Cholesterol: 79 mg/dL (ref 0–99)
NONHDL: 92.2
TRIGLYCERIDES: 65 mg/dL (ref 0.0–149.0)
Total CHOL/HDL Ratio: 3
VLDL: 13 mg/dL (ref 0.0–40.0)

## 2013-08-12 LAB — HEPATIC FUNCTION PANEL
ALBUMIN: 3.6 g/dL (ref 3.5–5.2)
ALK PHOS: 57 U/L (ref 39–117)
ALT: 17 U/L (ref 0–53)
AST: 25 U/L (ref 0–37)
Bilirubin, Direct: 0 mg/dL (ref 0.0–0.3)
TOTAL PROTEIN: 6.3 g/dL (ref 6.0–8.3)
Total Bilirubin: 0.6 mg/dL (ref 0.2–1.2)

## 2013-11-12 ENCOUNTER — Other Ambulatory Visit: Payer: Self-pay | Admitting: *Deleted

## 2013-11-12 DIAGNOSIS — I83893 Varicose veins of bilateral lower extremities with other complications: Secondary | ICD-10-CM

## 2013-11-18 ENCOUNTER — Encounter: Payer: Self-pay | Admitting: Vascular Surgery

## 2013-11-19 ENCOUNTER — Ambulatory Visit (INDEPENDENT_AMBULATORY_CARE_PROVIDER_SITE_OTHER): Payer: Medicare Other | Admitting: Vascular Surgery

## 2013-11-19 ENCOUNTER — Encounter: Payer: Self-pay | Admitting: Vascular Surgery

## 2013-11-19 ENCOUNTER — Ambulatory Visit (HOSPITAL_COMMUNITY)
Admission: RE | Admit: 2013-11-19 | Discharge: 2013-11-19 | Disposition: A | Discharge status: Home / Self Care | Payer: Medicare Other | Source: Ambulatory Visit | Attending: Vascular Surgery | Admitting: Vascular Surgery

## 2013-11-19 VITALS — BP 185/87 | HR 56 | Resp 16 | Ht 70.0 in | Wt 180.0 lb

## 2013-11-19 DIAGNOSIS — I83893 Varicose veins of bilateral lower extremities with other complications: Secondary | ICD-10-CM | POA: Insufficient documentation

## 2013-11-19 DIAGNOSIS — I83891 Varicose veins of right lower extremities with other complications: Secondary | ICD-10-CM | POA: Insufficient documentation

## 2013-11-19 NOTE — Progress Notes (Signed)
Subjective:     Patient ID: [name] [name], male   DOB: 1940-12-03, 73 y.o.   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 He had previous laser ablation performed by me on the right leg in September 2012. He had a good result. He also had some sclerotherapy performed for distal reticular veins. Several months ago he developed recurrent varicosities in the anterior thigh and a slightly different location extending down to the distal thigh and proximal calf. He's had no DVT or thrombophlebitis or stasis ulcers or bleeding. He is having a heavy aching discomfort as the day progresses. He has no symptoms in the contralateral left leg.  Past Medical History  Diagnosis Date  . 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 TIA 12/03/2007  . CEREBROVASCULAR ACCIDENT, HX OF 09/20/2007  . HYPERLIPIDEMIA 12/18/2006  . PERIPHERAL VASCULAR DISEASE 09/20/2007  . BRADYCARDIA 09/20/2007  . DIVERTICULOSIS, COLON 09/20/2007  . COLONIC POLYPS, HX OF 12/18/2006  . CAD (coronary artery disease)     History  Substance Use Topics  . Smoking status: Never Smoker   . Smokeless tobacco: Never Used  . Alcohol Use: No    Family History  Problem Relation Age of Onset  . Heart disease Father   . Cancer Brother     Allergies  Allergen Reactions  . Niacin     REACTION: hives  . Simvastatin Other (See Comments)    myalgias    Current outpatient prescriptions: aspirin 81 MG tablet, Take 1 tablet (81 mg total) by mouth daily., Disp: 30 tablet, Rfl: 6;  BEE POLLEN PO, Take by mouth 2 (two) times daily., Disp: , Rfl: ;  Red Yeast Rice Extract (RED YEAST RICE PO), Take 1 tablet by mouth daily., Disp: , Rfl: ;  Saw Palmetto, Serenoa repens, (SAW PALMETTO PO), Take 1 tablet by mouth daily., Disp: , Rfl:  atorvastatin (LIPITOR) 40 MG tablet, Take 1 tablet (40 mg total) by mouth daily., Disp: 30 tablet, Rfl: 11;  nitroGLYCERIN (NITROSTAT) 0.4 MG SL tablet, Place 1 tablet (0.4 mg  total) under the tongue every 5 (five) minutes as needed for chest pain., Disp: 25 tablet, Rfl: 6  BP 185/87 mmHg  Pulse 56  Resp 16  Ht 5\' 10"  (1.778 m)  Wt 180 lb (81.647 kg)  BMI 25.83 kg/m2  Body mass index is 25.83 kg/(m^2).          Review of SystemsDenies chest pain, dyspnea on exertion, PND, orthopnea, hemoptysis. Had coronary artery bypass grafting performed in the past few years and is doing well. All other systems negative complete review of systems     Objective:   Physical Exam BP 185/87 mmHg  Pulse 56  Resp 16  Ht 5\' 10"  (1.778 m)  Wt 180 lb (81.647 kg)  BMI 25.83 kg/m2  Gen.-alert and oriented x3 in no apparent distress HEENT normal for age Lungs no rhonchi or wheezing Cardiovascular regular rhythm no murmurs carotid pulses 3+ palpable no bruits audible Abdomen soft nontender no palpable masses Musculoskeletal free of  major deformities Skin clear -no rashes Neurologic normal Lower extremities 3+ femoral and dorsalis pedis pulses palpable bilaterally with no edemaleft leg 1+ edema right leg Large bulging varicosities beginning in the proximal to mid third of the anterior thigh extending down anteriorly and around the knee into the pretibial region and medial calf. No active ulcers noted. Extensive reticular network around the medial malleolus on the right. 1+ edema.  Today I ordered  duplex scan of the right lower extremity which I reviewed and interpreted. There is gross reflux in the right great saphenous vein which communicates with the bulging varicosities and there is no DVT. There is some deep venous reflux in the common and superficial femoral vein.       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 Patient will then return in 3 months to see if stab phlebectomyof the painful varicosities and sclerotherapy of the distal reticular veins would be indicated.

## 2013-12-26 ENCOUNTER — Encounter (HOSPITAL_COMMUNITY): Payer: Self-pay | Admitting: Cardiovascular Disease

## 2014-02-24 ENCOUNTER — Encounter: Payer: Self-pay | Admitting: Vascular Surgery

## 2014-02-24 ENCOUNTER — Ambulatory Visit (INDEPENDENT_AMBULATORY_CARE_PROVIDER_SITE_OTHER): Payer: Medicare Other | Admitting: Cardiovascular Disease

## 2014-02-24 ENCOUNTER — Encounter: Payer: Self-pay | Admitting: Cardiovascular Disease

## 2014-02-24 VITALS — BP 160/80 | HR 46 | Ht 70.0 in | Wt 190.8 lb

## 2014-02-24 DIAGNOSIS — E785 Hyperlipidemia, unspecified: Secondary | ICD-10-CM

## 2014-02-24 DIAGNOSIS — I2510 Atherosclerotic heart disease of native coronary artery without angina pectoris: Secondary | ICD-10-CM

## 2014-02-24 NOTE — Progress Notes (Signed)
History of Present Illness: 74 yo male with history of CAD s/p 3V CABG 02/10/12 (LIMA to LAD, RIMA to RCA, free radial to OM), hyperlipidemia, prostate cancer, CVA here today for cardiac follow up. He has been followed in the past by Dr. Lia Foyer. He presented with unstable angina in January 2014. Cardiac cath 02/10/12 with 99% ostial left main stenosis, 99% ostial RCA stenosis. 3V CABG on 02/10/12. He has done well following his CABG.   He is here today for follow up. He feels well. No chest pain or SOB. He is active. 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   Primary Care Physician: Cathlean Cower   Last Lipid Profile:Lipid Panel     Component Value Date/Time   CHOL 134 08/12/2013 0900   TRIG 65.0 08/12/2013 0900   TRIG 61 12/16/2005 0753   HDL 41.80 08/12/2013 0900   CHOLHDL 3 08/12/2013 0900   CHOLHDL 4.1 CALC 12/16/2005 0753   VLDL 13.0 08/12/2013 0900   LDLCALC 79 08/12/2013 0900    Past Medical History  Diagnosis Date  . 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 TIA 12/03/2007  . CEREBROVASCULAR ACCIDENT, HX OF 09/20/2007  . HYPERLIPIDEMIA 12/18/2006  . PERIPHERAL VASCULAR DISEASE 09/20/2007  . BRADYCARDIA 09/20/2007  . DIVERTICULOSIS, COLON 09/20/2007  . COLONIC POLYPS, HX OF 12/18/2006  . CAD (coronary artery disease)     Past Surgical History  Procedure Laterality Date  . Coronary artery bypass graft  02/10/2012    Procedure: CORONARY ARTERY BYPASS GRAFTING (CABG);  Surgeon: Ivin Poot, MD;  Location: [place];  Service: Open Heart Surgery;  Laterality: N/A;  CABG x three, using Bilateral mammary artery harvest and left arm radial harvest  . Intraoperative transesophageal echocardiogram  02/10/2012    Procedure: INTRAOPERATIVE TRANSESOPHAGEAL ECHOCARDIOGRAM;  Surgeon: Ivin Poot, MD;  Location: [place];  Service: Open Heart Surgery;  Laterality: N/A;  . Radial artery harvest  02/10/2012    Procedure: RADIAL ARTERY HARVEST;  Surgeon: Ivin Poot,  MD;  Location: [place];  Service: Open Heart Surgery;  Laterality: Left;  . Left heart catheterization with coronary angiogram N/A 02/10/2012    Procedure: LEFT HEART CATHETERIZATION WITH CORONARY ANGIOGRAM;  Surgeon: Burnell Blanks, MD;  Location: [facility] CATH LAB;  Service: Cardiovascular;  Laterality: N/A;    Current Outpatient Prescriptions  Medication Sig Dispense Refill  . aspirin 81 MG tablet Take 1 tablet (81 mg total) by mouth daily. 30 tablet 6  . atorvastatin (LIPITOR) 40 MG tablet Take 1 tablet (40 mg total) by mouth daily. 30 tablet 11  . BEE POLLEN PO Take by mouth 2 (two) times daily.    . nitroGLYCERIN (NITROSTAT) 0.4 MG SL tablet Place 1 tablet (0.4 mg total) under the tongue every 5 (five) minutes as needed for chest pain. 25 tablet 6  . Red Yeast Rice Extract (RED YEAST RICE PO) Take 1 tablet by mouth daily.    . Saw Palmetto, Serenoa repens, (SAW PALMETTO PO) Take 1 tablet by mouth daily.     No current facility-administered medications for this visit.    Allergies  Allergen Reactions  . Niacin     REACTION: hives  . Simvastatin Other (See Comments)    myalgias    History   Social History  . Marital Status: Married    Spouse Name: N/A    Number of Children: N/A  . Years of Education: N/A   Occupational History  . Not  on file.   Social History Main Topics  . Smoking status: Never Smoker   . Smokeless tobacco: Never Used  . Alcohol Use: No  . Drug Use: No  . Sexual Activity: Not on file   Other Topics Concern  . Not on file   Social History Narrative    Family History  Problem Relation Age of Onset  . Heart disease Father   . Cancer Brother     Review of Systems:  As stated in the HPI and otherwise negative.   BP 160/80 mmHg  Pulse 46  Ht 5\' 10"  (1.778 m)  Wt 190 lb 12.8 oz (86.546 kg)  BMI 27.38 kg/m2  SpO2 99%  Physical Examination: General: Well developed, well nourished, NAD HEENT: OP clear, mucus membranes moist SKIN: warm,  dry. No rashes. Neuro: No focal deficits Musculoskeletal: Muscle strength 5/5 all ext Psychiatric: Mood and affect normal Neck: No JVD, no carotid bruits, no thyromegaly, no lymphadenopathy. Lungs:Clear bilaterally, no wheezes, rhonci, crackles Cardiovascular: Regular rhythm. [name] [name]. No murmurs, gallops or rubs. Abdomen:Soft. Bowel sounds present. Non-tender.  Extremities: No lower extremity edema. Pulses are 2 + in the bilateral DP/PT.  Cardiac cath 02/10/12:  Left main: Distal 99% stenosis.  Left Anterior Descending Artery: Large caliber vessel that courses to the apex. There is calcification in the proximal vessel. The proximal vessel has a 90% stenosis. The mid vessel has serial 50% stenoses. The distal vessel has mild plaque disease. The diagonal branch is small to moderate sized and has mild plaque disease.  Circumflex Artery: Large caliber, calcified vessel with 30% stenosis in the proximal vessel. The obtuse marginal is moderate in caliber and has 30% serial stenoses.  Right Coronary Artery: Large, dominant vessel with 99% ostial stenosis. The mid vessel has serial 30% stenoses with moderate calcification. The distal vessel has a 40-50% stenosis just before the bifurcation. The PDA and posterolateral branches are moderate in caliber with diffuse plaque but no focally obstructive lesions.  Left Ventricular Angiogram: LVEF=60-65%.   EKG: sinus, rate 46 bpm.   Assessment and Plan:   1. CAD: s/p CABG. He has been doing well. Continue ASA and statin. He did not tolerate beta blockers secondary to bradycardia.   2. Hyperlipidemia: Continue statin. Lipids well controlled.

## 2014-02-24 NOTE — Patient Instructions (Signed)
Your physician wants you to follow-up in:  12 months.  You will receive a reminder letter in the mail two months in advance. If you don't receive a letter, please call our office to schedule the follow-up appointment.   

## 2014-02-25 ENCOUNTER — Ambulatory Visit (INDEPENDENT_AMBULATORY_CARE_PROVIDER_SITE_OTHER): Payer: Medicare Other | Admitting: Vascular Surgery

## 2014-02-25 ENCOUNTER — Encounter: Payer: Self-pay | Admitting: Vascular Surgery

## 2014-02-25 VITALS — BP 167/77 | HR 55 | Resp 16 | Ht 70.0 in | Wt 180.0 lb

## 2014-02-25 DIAGNOSIS — I83893 Varicose veins of bilateral lower extremities with other complications: Secondary | ICD-10-CM

## 2014-02-25 NOTE — Progress Notes (Signed)
Subjective:     Patient ID: [name] [name], male   DOB: 1940-01-26, 74 y.o.   MRN: [id number]  HPI this 74 year old male returns today for further evaluation of his painful varicosities in the right leg. He continues to have aching throbbing and burning discomfort. In the right anterior and medial thigh which worsen as the day progresses. He has tried long-leg elastic compression stockings 20-30 millimeter gradient as well as elevation ibuprofen with no improvement. The symptoms are worsening and affecting his daily living.  Past Medical History  Diagnosis Date  . 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 TIA 12/03/2007  . CEREBROVASCULAR ACCIDENT, HX OF 09/20/2007  . HYPERLIPIDEMIA 12/18/2006  . PERIPHERAL VASCULAR DISEASE 09/20/2007  . BRADYCARDIA 09/20/2007  . DIVERTICULOSIS, COLON 09/20/2007  . COLONIC POLYPS, HX OF 12/18/2006  . CAD (coronary artery disease)     History  Substance Use Topics  . Smoking status: Never Smoker   . Smokeless tobacco: Never Used  . Alcohol Use: No    Family History  Problem Relation Age of Onset  . Heart disease Father   . Cancer Brother     Allergies  Allergen Reactions  . Niacin     REACTION: hives  . Simvastatin Other (See Comments)    myalgias     Current outpatient prescriptions:  .  aspirin 81 MG tablet, Take 1 tablet (81 mg total) by mouth daily., Disp: 30 tablet, Rfl: 6 .  atorvastatin (LIPITOR) 40 MG tablet, Take 1 tablet (40 mg total) by mouth daily., Disp: 30 tablet, Rfl: 11 .  BEE POLLEN PO, Take by mouth 2 (two) times daily., Disp: , Rfl:  .  nitroGLYCERIN (NITROSTAT) 0.4 MG SL tablet, Place 1 tablet (0.4 mg total) under the tongue every 5 (five) minutes as needed for chest pain., Disp: 25 tablet, Rfl: 6 .  Red Yeast Rice Extract (RED YEAST RICE PO), Take 1 tablet by mouth daily., Disp: , Rfl:  .  Saw Palmetto, Serenoa repens, (SAW PALMETTO PO), Take 1 tablet by mouth daily., Disp: , Rfl:   BP 167/77 mmHg  Pulse 55  Resp 16  Ht 5\' 10"   (1.778 m)  Wt 180 lb (81.647 kg)  BMI 25.83 kg/m2  Body mass index is 25.83 kg/(m^2).           Review of Systems  Denies chest pain, orthopnea, hemoptysis, claudication.     Objective:   Physical Exam BP 167/77 mmHg  Pulse 55  Resp 16  Ht 5\' 10"  (1.778 m)  Wt 180 lb (81.647 kg)  BMI 25.83 kg/m2   Gen. Well-developed well-nourished male no apparent stress alert and oriented 3  lungs no rhonchi or wheezing Right leg with large bulging varicosities beginning in the mid 2 upper third of the thigh anteriorly extending down into the medial calf and pretibial region with 1+ edema distally but no hyperpigmentation or ulceration noted. 3+  dorsalis pedis pulse palpable.   patient has gross reflux in the proximal right great saphenous vein down to the mid thigh level supplying these bulging varicosities. I confirmed this with a bedside sono site exam.     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     Plan:      patient needs #1 laser ablation right great saphenous vein followed by a 3 month waiting period to  see if he will then need stab phlebectomy of painful varicosities.  Will proceed with precertification to perform this in the near future

## 2014-03-04 ENCOUNTER — Other Ambulatory Visit: Payer: Self-pay | Admitting: *Deleted

## 2014-03-04 DIAGNOSIS — I83891 Varicose veins of right lower extremities with other complications: Secondary | ICD-10-CM

## 2014-03-28 ENCOUNTER — Encounter: Payer: Self-pay | Admitting: Vascular Surgery

## 2014-03-31 ENCOUNTER — Ambulatory Visit (INDEPENDENT_AMBULATORY_CARE_PROVIDER_SITE_OTHER): Payer: Medicare Other | Admitting: Vascular Surgery

## 2014-03-31 ENCOUNTER — Encounter: Payer: Self-pay | Admitting: Vascular Surgery

## 2014-03-31 VITALS — BP 140/77 | HR 53 | Resp 16 | Ht 70.0 in | Wt 180.0 lb

## 2014-03-31 DIAGNOSIS — I83893 Varicose veins of bilateral lower extremities with other complications: Secondary | ICD-10-CM

## 2014-03-31 NOTE — Progress Notes (Signed)
Laser Ablation Procedure    Date: 03/31/2014   [name] [name] DOB:1940-12-21  Consent signed: Yes    Surgeon:  Dr. Nelda Severe. Kellie Simmering  Procedure: Laser Ablation: right Greater Saphenous Vein  BP 140/77 mmHg  Pulse 53  Resp 16  Ht 5\' 10"  (1.778 m)  Wt 180 lb (81.647 kg)  BMI 25.83 kg/m2  Tumescent Anesthesia: 100 cc 0.9% NaCl with 50 cc Lidocaine HCL with 1% Epi and 15 cc 8.4% NaHCO3  Local Anesthesia: 7 cc Lidocaine HCL and NaHCO3 (ratio 2:1)  Pulsed Mode: 15 watts, 553ms delay, 1.0 duration  Total Energy:              Total Pulses:                Total Time:     Patient tolerated procedure well  Notes:   Description of Procedure:  After marking the course of the secondary varicosities, the patient was placed on the operating table in the supine position, and the right leg was prepped and draped in sterile fashion.   Local anesthetic was administered and under ultrasound guidance the saphenous vein was accessed with a micro needle and guide wire; then the mirco puncture sheath was placed.  A guide wire was inserted saphenofemoral junction , followed by a 5 french sheath.  The position of the sheath and then the laser fiber below the junction was confirmed using the ultrasound.  Tumescent anesthesia was administered along the course of the saphenous vein using ultrasound guidance. The patient was placed in Trendelenburg position and protective laser glasses were placed on patient and staff, and the laser was fired at 15 watts continuous mode advancing 1-10mm/second for a total of 703 joules.     Steri strips were applied to the stab wounds and ABD pads and thigh high compression stockings were applied.  Ace wrap bandages were applied over the phlebectomy sites and at the top of the saphenofemoral junction. Blood loss was less than 15 cc.  The patient ambulated out of the operating room having tolerated the procedure well.

## 2014-03-31 NOTE — Progress Notes (Signed)
Subjective:     Patient ID: [name] [name], male   DOB: 06-07-1940, 74 y.o.   MRN: [id number]  HPI this 74 year old male had laser ablation of the right great saphenous vein in the proximal third of the right thigh performed under local tumescent anesthesia. A total of 704 J of energy was utilized. He tolerated the procedure well.   Review of Systems     Objective:   Physical Exam BP 140/77 mmHg  Pulse 53  Resp 16  Ht 5\' 10"  (1.778 m)  Wt 180 lb (81.647 kg)  BMI 25.83 kg/m2       Assessment:     Well-tolerated laser ablation right great saphenous vein for gross reflux performed under local tumescent anesthesia    Plan:     Return in one week for venous duplex exams confirm closure right great saphenous vein Patient will then return in 3 months to evaluate him for possible stab phlebectomy of painful varicosities

## 2014-04-01 ENCOUNTER — Telehealth: Payer: Self-pay | Admitting: *Deleted

## 2014-04-01 NOTE — Telephone Encounter (Signed)
Asked the pat to call me if he is having any questions or concerns.

## 2014-04-02 ENCOUNTER — Encounter: Payer: Self-pay | Admitting: Vascular Surgery

## 2014-04-07 ENCOUNTER — Encounter: Payer: Self-pay | Admitting: Vascular Surgery

## 2014-04-08 ENCOUNTER — Encounter: Payer: Self-pay | Admitting: Vascular Surgery

## 2014-04-08 ENCOUNTER — Ambulatory Visit (INDEPENDENT_AMBULATORY_CARE_PROVIDER_SITE_OTHER): Payer: Medicare Other | Admitting: Vascular Surgery

## 2014-04-08 ENCOUNTER — Ambulatory Visit (HOSPITAL_COMMUNITY)
Admission: RE | Admit: 2014-04-08 | Discharge: 2014-04-08 | Disposition: A | Discharge status: Home / Self Care | Payer: Medicare Other | Source: Ambulatory Visit | Attending: Vascular Surgery | Admitting: Vascular Surgery

## 2014-04-08 VITALS — BP 168/78 | HR 50 | Resp 18 | Ht 70.0 in | Wt 180.0 lb

## 2014-04-08 DIAGNOSIS — I83891 Varicose veins of right lower extremities with other complications: Secondary | ICD-10-CM

## 2014-04-08 NOTE — Progress Notes (Signed)
Filed Vitals:   04/08/14 0951 04/08/14 0952  BP: 173/79 168/78  Pulse: 52 50  Resp: 18   Height: 5\' 10"  (1.778 m)   Weight: 180 lb (81.647 kg)

## 2014-04-08 NOTE — Progress Notes (Signed)
Subjective:     Patient ID: [name] [name], male   DOB: Apr 27, 1940, 74 y.o.   MRN: [id number]  HPI this 74 year old male returns 1 week post laser ablation right great saphenous vein for painful varicosities due to gross reflux in right great saphenous vein. He has had mild discomfort in the right proximal thigh up to the inguinal area. He has noticed no distal edema. He states that the varicosities are less tense. He has worn his elastic compression stocking and taken ibuprofen as instructed. He is not had any other complaints.  Past Medical History  Diagnosis Date  . 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 TIA 12/03/2007  . CEREBROVASCULAR ACCIDENT, HX OF 09/20/2007  . HYPERLIPIDEMIA 12/18/2006  . PERIPHERAL VASCULAR DISEASE 09/20/2007  . BRADYCARDIA 09/20/2007  . DIVERTICULOSIS, COLON 09/20/2007  . COLONIC POLYPS, HX OF 12/18/2006  . CAD (coronary artery disease)     History  Substance Use Topics  . Smoking status: Never Smoker   . Smokeless tobacco: Never Used  . Alcohol Use: No    Family History  Problem Relation Age of Onset  . Heart disease Father   . Cancer Brother     Allergies  Allergen Reactions  . Niacin     REACTION: hives  . Simvastatin Other (See Comments)    myalgias     Current outpatient prescriptions:  .  aspirin 81 MG tablet, Take 1 tablet (81 mg total) by mouth daily., Disp: 30 tablet, Rfl: 6 .  atorvastatin (LIPITOR) 40 MG tablet, Take 1 tablet (40 mg total) by mouth daily., Disp: 30 tablet, Rfl: 11 .  BEE POLLEN PO, Take by mouth 2 (two) times daily., Disp: , Rfl:  .  nitroGLYCERIN (NITROSTAT) 0.4 MG SL tablet, Place 1 tablet (0.4 mg total) under the tongue every 5 (five) minutes as needed for chest pain., Disp: 25 tablet, Rfl: 6 .  Red Yeast Rice Extract (RED YEAST RICE PO), Take 1 tablet by mouth daily., Disp: , Rfl:  .  Saw Palmetto, Serenoa repens, (SAW PALMETTO PO), Take 1 tablet by mouth daily., Disp: , Rfl:   Filed Vitals:   04/08/14 0951 04/08/14 0952   BP: 173/79 168/78  Pulse: 52 50  Resp: 18   Height: 5\' 10"  (1.778 m)   Weight: 180 lb (81.647 kg)     Body mass index is 25.83 kg/(m^2).           Review of Systems denies chest pain    dyspnea on exertion, PND, orthopnea, hemoptysis, claudication. Objective:   Physical Exam BP 168/78 mmHg  Pulse 50  Resp 18  Ht 5\' 10"  (1.778 m)  Wt 180 lb (81.647 kg)  BMI 25.83 kg/m2  Gen. well-developed well-nourished male no apparent stress alert and oriented 3 Lungs no rhonchi or wheezing Right leg with mild discomfort to deep palpation of proximal thigh over great saphenous vein. Bulging varicosities in the distal thigh medially into the medial calf are less tense than previously noted. 3+ dorsalis pedis pulse palpable with no distal edema.  Today I ordered a venous duplex exam of the right leg which I reviewed and interpreted. There is no DVT. There is total closure of the great saphenous vein up to near the saphenofemoral junction.     Assessment:     Successful laser ablation right great saphenous vein for gross reflux with painful varicosities     Plan:     Return in 3 months to evaluate whether stab phlebectomy of residual varicosities will be  indicated

## 2014-04-24 ENCOUNTER — Telehealth: Payer: Self-pay | Admitting: *Deleted

## 2014-04-24 NOTE — Telephone Encounter (Signed)
Returning Mr. [name] [name]'s earlier phone call regarding an injection site that "is not healing or improving."  [name] [name] is s/p endovenous laser ablation right greater saphenous vein on 03-31-2014 by Dr. Tinnie Gens.  [name] [name] states the injection site has a "gray scab surrounded by red area that is the size of a dime."  [name] [name] denies fever, oozing/drainage from the site or pain.  [name] [name] has tried neosporin ointment on the affected area without any improvement and would like [name] [name] to assess it.  Appointment made for 04-28-2014 at 2 PM.  [name] aware of appointment date and time.

## 2014-04-25 ENCOUNTER — Encounter: Payer: Self-pay | Admitting: Vascular Surgery

## 2014-04-28 ENCOUNTER — Encounter: Payer: Self-pay | Admitting: Vascular Surgery

## 2014-04-28 ENCOUNTER — Ambulatory Visit (INDEPENDENT_AMBULATORY_CARE_PROVIDER_SITE_OTHER): Payer: Medicare Other | Admitting: Vascular Surgery

## 2014-04-28 VITALS — BP 135/80 | HR 65 | Resp 16 | Ht 70.0 in | Wt 180.0 lb

## 2014-04-28 DIAGNOSIS — I83891 Varicose veins of right lower extremities with other complications: Secondary | ICD-10-CM

## 2014-04-28 NOTE — Progress Notes (Signed)
Subjective:     Patient ID: [name] [name], male   DOB: [birth year], 74 y.o.   MRN: [id number]  HPI patient had laser ablation right great saphenous vein performed a few weeks ago and has an area of skin irritation where the Xylocaine was injected in the proximal thigh to introduce the tumescent anesthesia. It has been itching some. It started with a small eschar which eventually fell off and there is some red irritated skin surrounding this which is improving. Patient states that he continues to have mild edema distally in that the bulges in his lower thigh and calf continue to ache and throb although they are less tense. He is wearing his long-leg elastic compression stockings as instructed.  Past Medical History  Diagnosis Date  . 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 TIA 12/03/2007  . CEREBROVASCULAR ACCIDENT, HX OF 09/20/2007  . HYPERLIPIDEMIA 12/18/2006  . PERIPHERAL VASCULAR DISEASE 09/20/2007  . BRADYCARDIA 09/20/2007  . DIVERTICULOSIS, COLON 09/20/2007  . COLONIC POLYPS, HX OF 12/18/2006  . CAD (coronary artery disease)   . Varicose veins     History  Substance Use Topics  . Smoking status: Never Smoker   . Smokeless tobacco: Never Used  . Alcohol Use: No    Family History  Problem Relation Age of Onset  . Heart disease Father   . Cancer Brother     Allergies  Allergen Reactions  . Niacin     REACTION: hives  . Simvastatin Other (See Comments)    myalgias     Current outpatient prescriptions:  .  aspirin 81 MG tablet, Take 1 tablet (81 mg total) by mouth daily., Disp: 30 tablet, Rfl: 6 .  atorvastatin (LIPITOR) 40 MG tablet, Take 1 tablet (40 mg total) by mouth daily., Disp: 30 tablet, Rfl: 11 .  BEE POLLEN PO, Take by mouth 2 (two) times daily., Disp: , Rfl:  .  nitroGLYCERIN (NITROSTAT) 0.4 MG SL tablet, Place 1 tablet (0.4 mg total) under the tongue every 5 (five) minutes as needed for chest pain., Disp: 25 tablet, Rfl: 6 .  Red Yeast Rice Extract (RED YEAST RICE PO),  Take 1 tablet by mouth daily., Disp: , Rfl:  .  Saw Palmetto, Serenoa repens, (SAW PALMETTO PO), Take 1 tablet by mouth daily., Disp: , Rfl:   Filed Vitals:   04/28/14 1410  BP: 135/80  Pulse: 65  Resp: 16  Height: 5\' 10"  (1.778 m)  Weight: 180 lb (81.647 kg)    Body mass index is 25.83 kg/(m^2).           Review of Systems denies chest pain, dyspnea on exertion, PND, orthopnea, hemoptysis     Objective:   Physical Exam BP 135/80 mmHg  Pulse 65  Resp 16  Ht 5\' 10"  (1.778 m)  Wt 180 lb (81.647 kg)  BMI 25.83 kg/m2  Gen. well-developed well-nourished male no apparent stress alert and oriented 3 Lungs no rhonchi or wheezing Right leg with one isolated area of skin irritation about 3 cm in diameter in the proximal third of the thigh anteriorly overlying the great saphenous vein where tumescent anesthesia was introduced. This is where Xylocaine was injected into the skin. None of the other areas show similar findings. There is no fluctuance or drainage in this area is slightly scaly center portion but otherwise just skin discoloration. It is nontender to palpation. Bulging varicosities remain present in the distal thigh and calf but less tense than previously noted.     Assessment:  Area of skin irritation where Xylocaine was injected 2 introduce tumescent anesthesia for laser ablation procedure a few weeks ago-resolving    Plan:     #1 Recommended Cortaid ointment-0.05%-available over-the-counter at the pharmacy to applied to skin twice daily #2 return in 3 months for further evaluation for possible stab phlebectomy of painful varicosities right leg

## 2014-06-06 ENCOUNTER — Encounter: Payer: Self-pay | Admitting: Gastroenterology

## 2014-06-23 IMAGING — CT CT CHEST W/O CM
2 of 4 series · 15 of 36 positions shown, 18 images · non-contrast
Comparison: No priors.

CLINICAL DATA: Enlarged ascending aorta.  History of prostate
cancer.

CT CHEST WITHOUT CONTRAST
TECHNIQUE: Multidetector CT imaging of the chest was performed
following the standard protocol without IV contrast.

[Series 2: chest without · axial · non-contrast · 0.68mm/px · z∈[-337,-17]mm · 12 of 74 slices shown, 15 images]
[im 5/74  mediastinal]
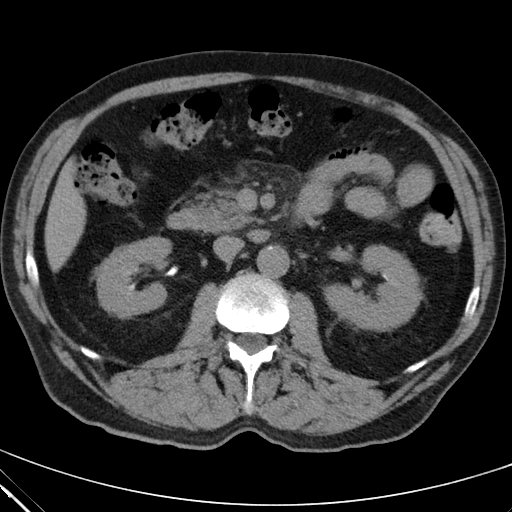
[im 5/74  lung]
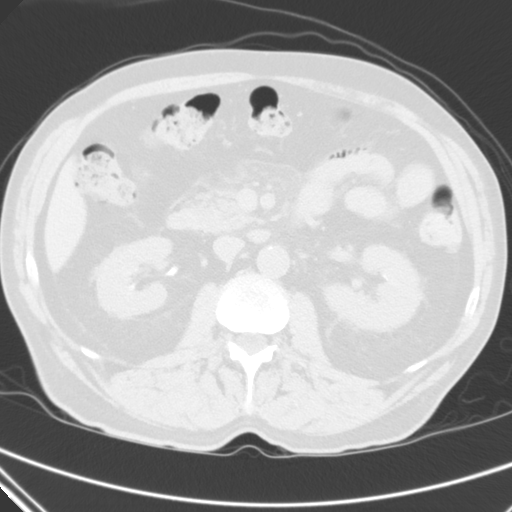
[im 10/74  lung]
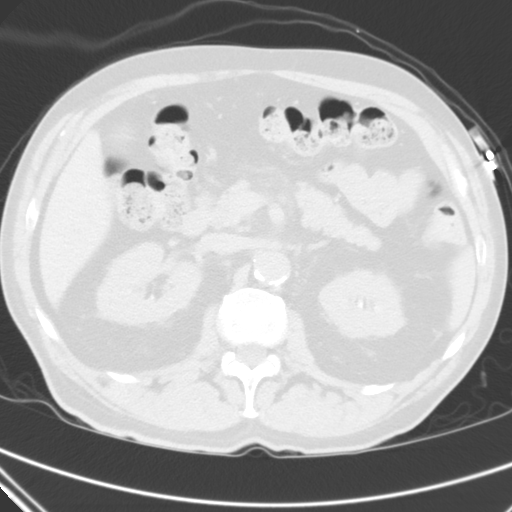
[im 15/74  lung]
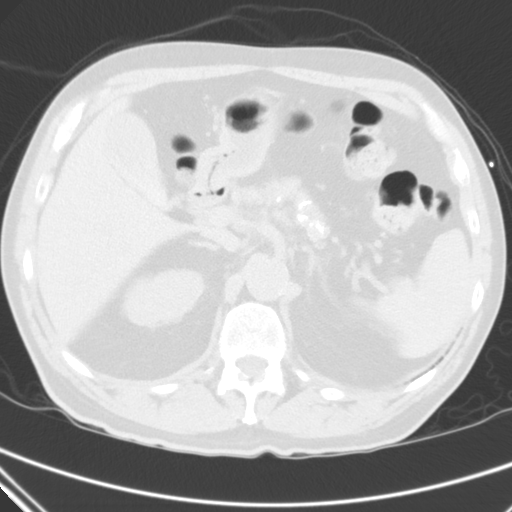
[im 25/74  lung]
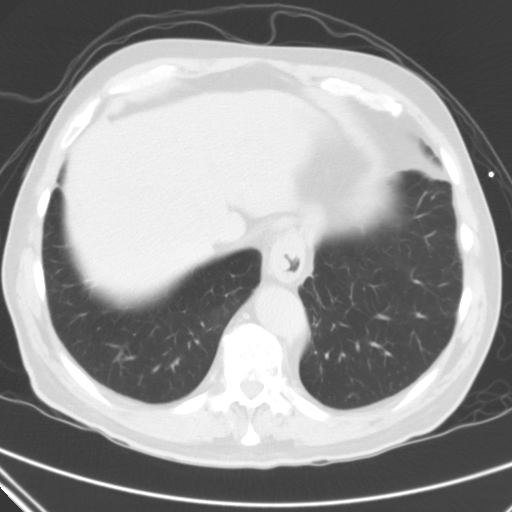
[im 30/74  mediastinal]
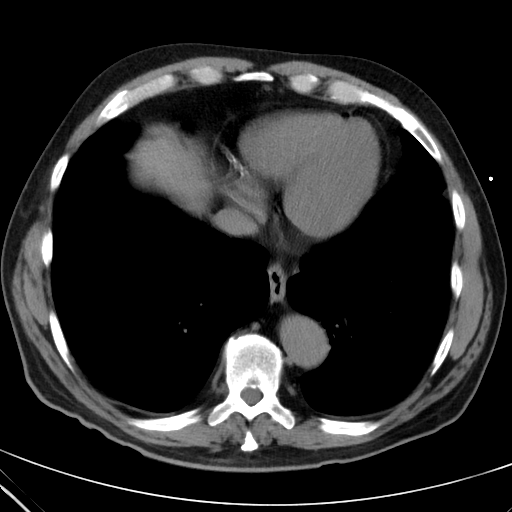
[im 30/74  lung]
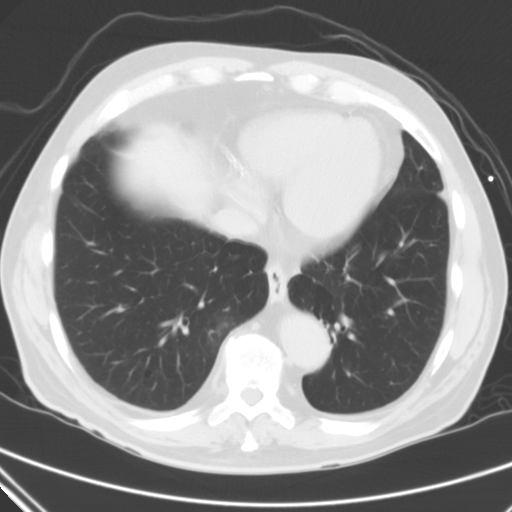
[im 35/74  lung]
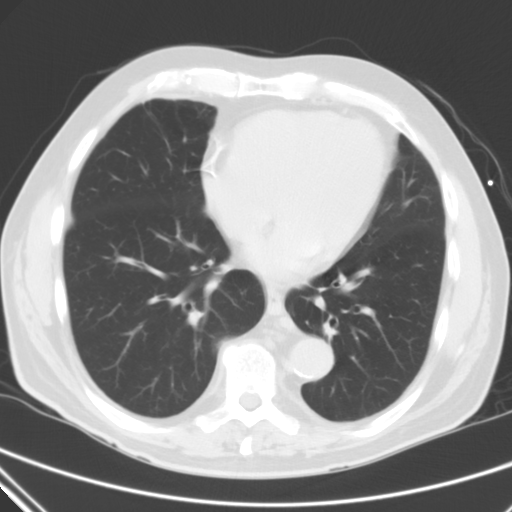
[im 39/74  lung]
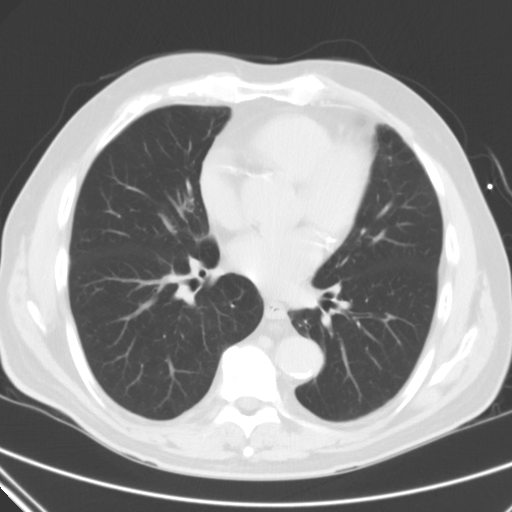
[im 44/74  lung]
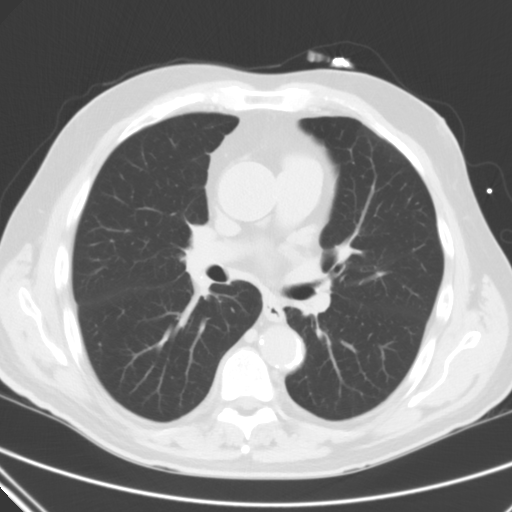
[im 49/74  mediastinal]
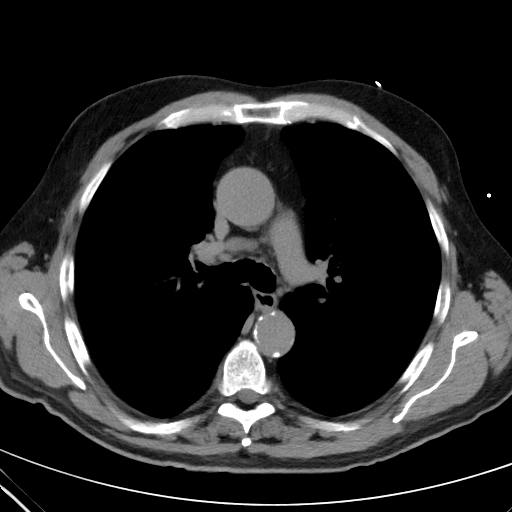
[im 49/74  lung]
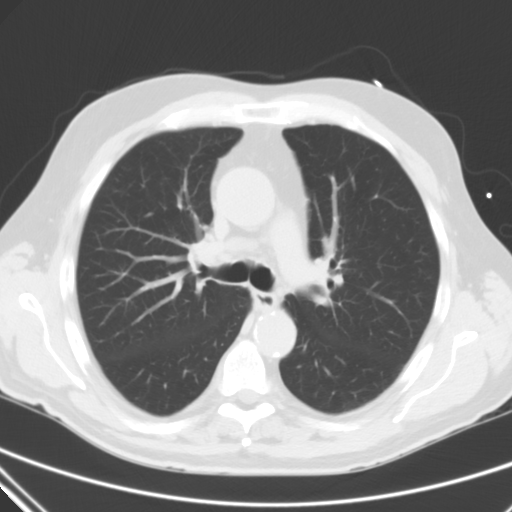
[im 59/74  lung]
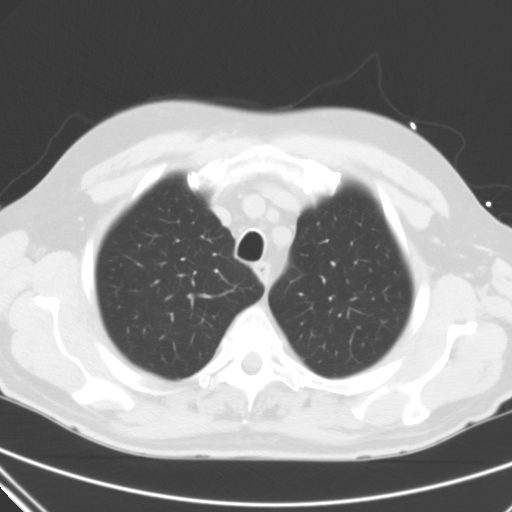
[im 64/74  lung]
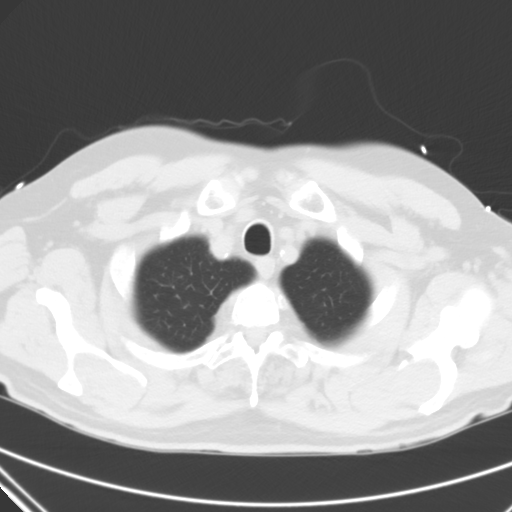
[im 69/74  lung]
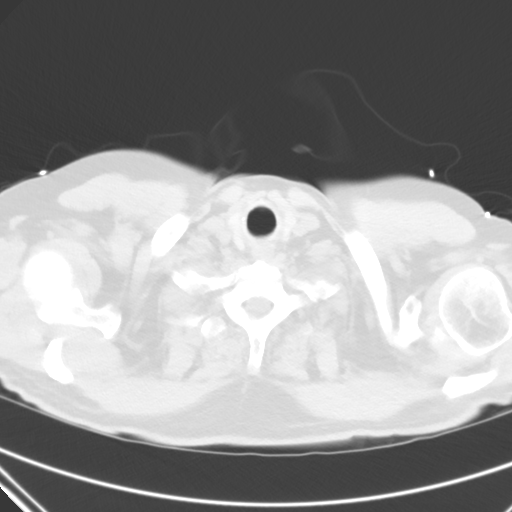

[mpr, coronal, coronal · coronal · 0.71mm/px · 3 of 111 slices shown]
[im 23/111  lung]
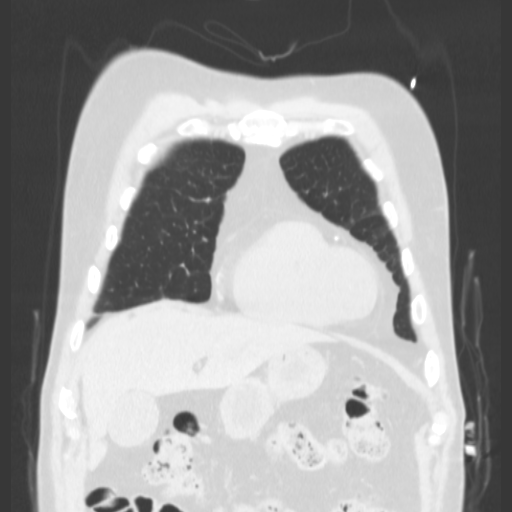
[im 45/111  lung]
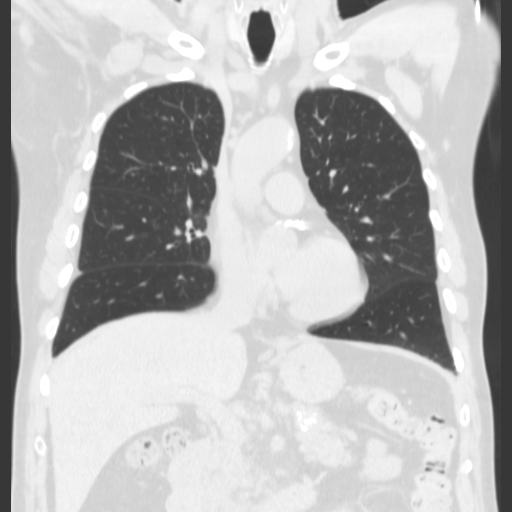
[im 67/111  lung]
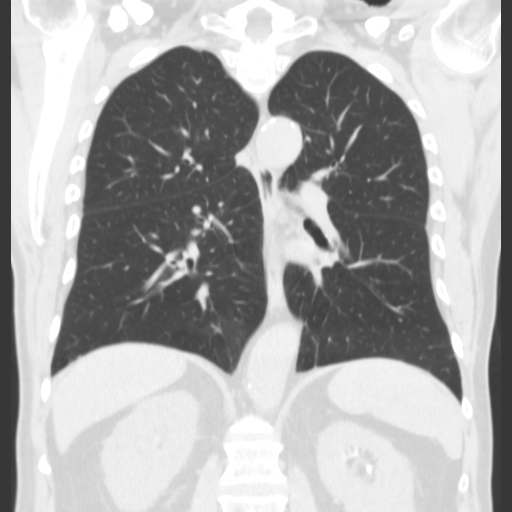

[15 of 36 positions shown; findings below may reference images not displayed]

FINDINGS: Mediastinum: Heart size is normal. There is no significant
pericardial fluid, thickening or pericardial calcification. There
is atherosclerosis of the thoracic aorta, the great vessels of the
mediastinum and the coronary arteries, including calcified
atherosclerotic plaque in the left main, left anterior descending,
left circumflex and right coronary arteries. Notably, despite the
extensive calcified plaque, there is no significant calcified
plaque in the ascending thoracic aorta, however, the ascending
thoracic aorta is mildly ectatic (4.2 cm in diameter).  No
pathologically enlarged mediastinal or hilar lymph nodes. Please
note that accurate exclusion of hilar adenopathy is limited on
noncontrast CT scans.  There is a small hiatal hernia.

Lungs/Pleura: There are a few scattered tiny 1-3 mm pulmonary
nodules in the lungs bilaterally. In addition, there is a 5 mm
subpleural nodule in the left lower lobe inferiorly (image 46 of
series 3) which is unchanged in retrospect compared to abdominal CT
scan 04/24/2003, and can be considered radiographically benign
requiring no imaging follow-up.  No larger or suspicious appearing
pulmonary nodules or masses are otherwise identified. No acute
consolidative airspace disease.  No pleural effusions.

Upper Abdomen: Iodinated contrast material is seen within the
collecting systems of the kidneys bilaterally, presumably related
to recent catheterization procedure.  There is also small amount of
high attenuation material layering dependently in the gallbladder,
likely to represent vicarious excretion of contrast into the bile.
Alternatively, this could represent a small amount of biliary
sludge.  Haziness in the small bowel mesentery is noted, and is
nonspecific, but similar in retrospect compared to remote prior
examination 04/24/2003, presumably related to a benign process.

Musculoskeletal: There are no aggressive appearing lytic or blastic
lesions noted in the visualized portions of the skeleton.
IMPRESSION: 1.  Mild ectasia of the ascending thoracic aorta (4.2 cm in
diameter).  Notably, despite the extensive atherosclerosis in the
aortic arch, great vessels, and the coronary arteries (left main
and three-vessel), the ascending aorta does not appear to be
calcified (is reassuring for CABG procedure).

2.  Multiple tiny 1-3 mm pulmonary nodules in the lungs bilaterally
are highly nonspecific. If the patient is at high risk for
bronchogenic carcinoma, follow-up chest CT at 1 year is
recommended.  If the patient is at low risk, no follow-up is
needed.  This recommendation follows the consensus statement:
Guidelines for Management of Small Pulmonary Nodules Detected on CT
Scans:  A Statement from the [HOSPITAL] as published in
3.  Additional incidental findings, as above.

## 2014-06-24 IMAGING — CR DG CHEST 1V PORT
1 series · 1 of 1 positions shown · non-contrast
Comparison: Chest radiograph performed 02/03/2012, and CT of the
chest performed 02/10/2012

CLINICAL DATA: Postoperative chest radiograph; assess tube and line
placement.

PORTABLE CHEST - 1 VIEW

[AP]
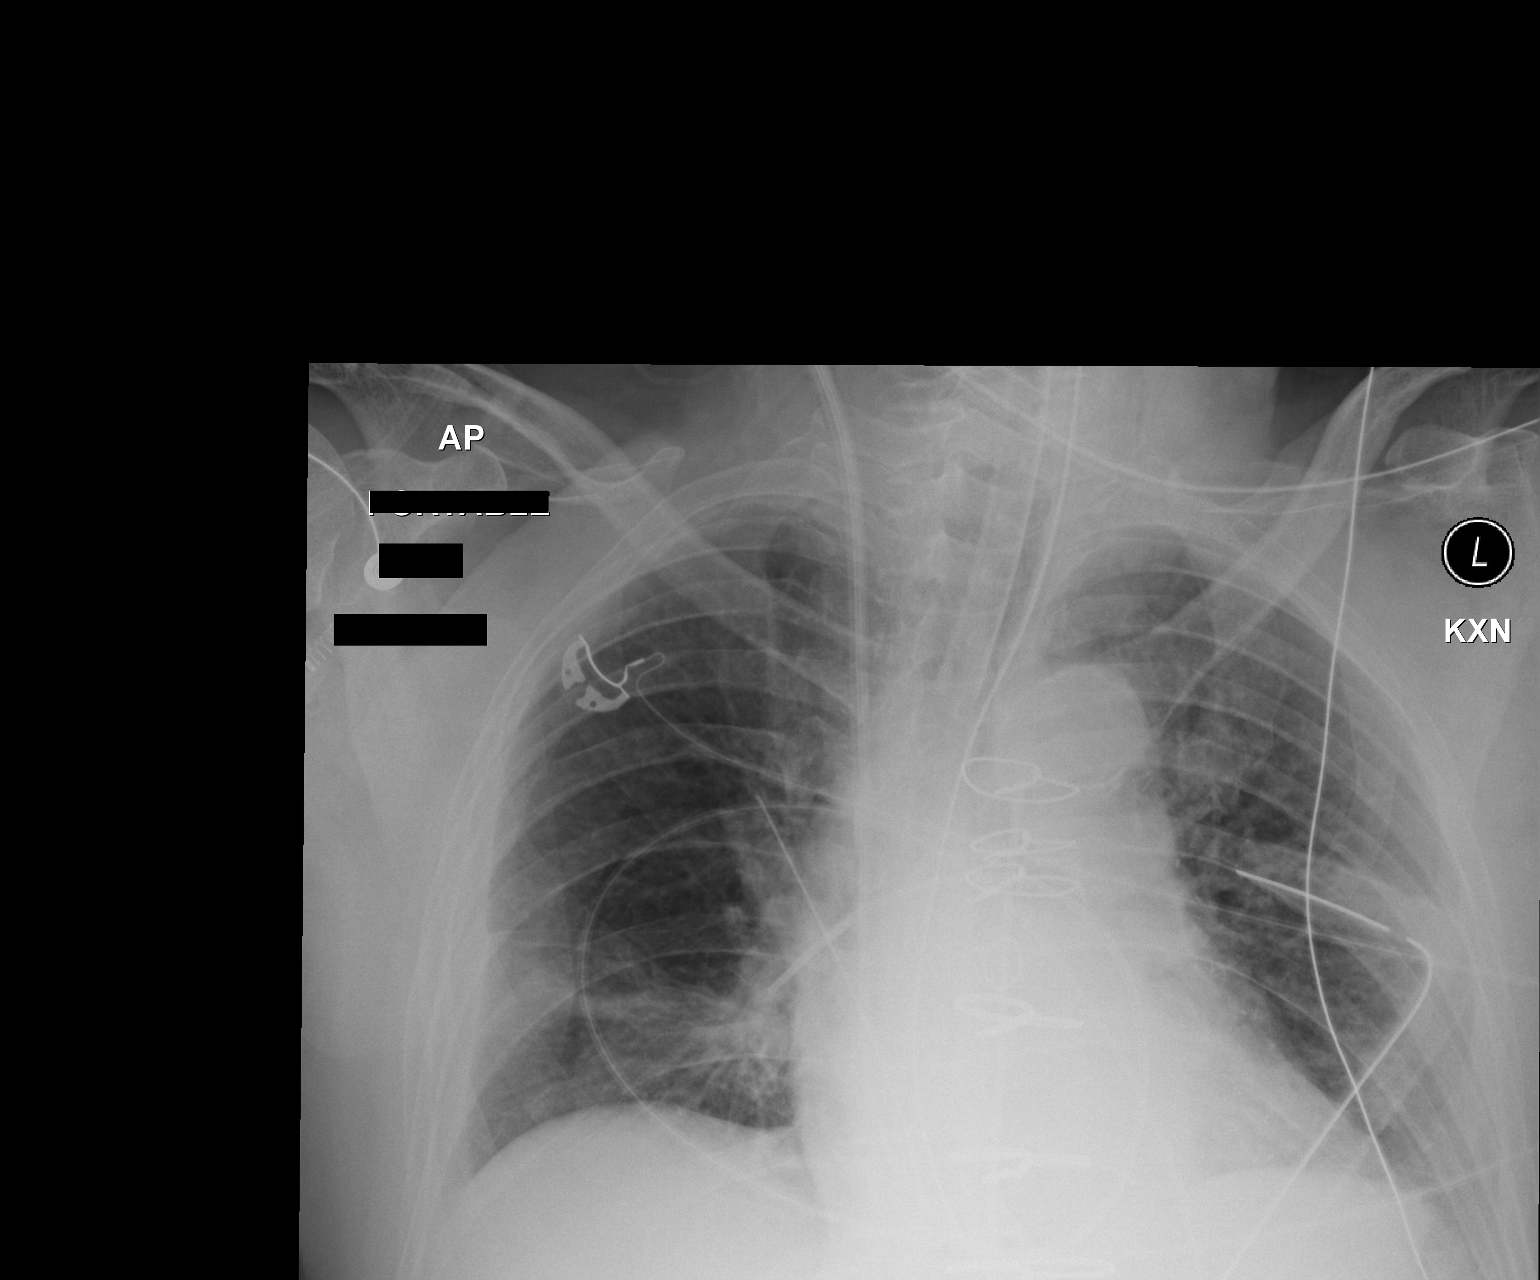

[1 of 1 positions shown; findings below may reference images not displayed]

FINDINGS: The patient's endotracheal tube is seen ending 2 cm above
the carina.  A right IJ Swan-Ganz catheter is noted ending likely
within the pulmonary artery to the right lower lobe.  An enteric
tube is seen extending below the diaphragm.  Bilateral chest tubes
are noted.

The lungs are hypoexpanded.  Mild vascular crowding and vascular
congestion is seen.  Mild bibasilar atelectasis is noted.  The left
costophrenic angle is incompletely imaged on this study.  There is
right lateral pleural thickening, possibly reflecting trace pleural
fluid.  No definite pneumothorax is seen.

The cardiomediastinal silhouette is borderline normal in size.  The
patient is status post median sternotomy, with evidence of recent
CABG.  No acute osseous abnormalities are identified.
IMPRESSION: 1.  Endotracheal tube seen ending 2 cm above the carina.
2.  Right IJ Swan-Ganz catheter noted extending relatively
distally, ending likely within the pulmonary artery to the right
lower lung lobe.
3.  Lungs hypoexpanded; mild vascular congestion and mild bibasilar
atelectasis noted.  Right lateral pleural thickening may reflect
trace pleural fluid.

## 2014-06-25 IMAGING — CR DG CHEST 1V PORT
1 series · 1 of 1 positions shown · non-contrast
Comparison: Portable chest x-rays yesterday.  Two-view chest x-ray
02/03/2012.

CLINICAL DATA: 2 days post CABG.  Extubation.

PORTABLE CHEST - 1 VIEW

[AP]
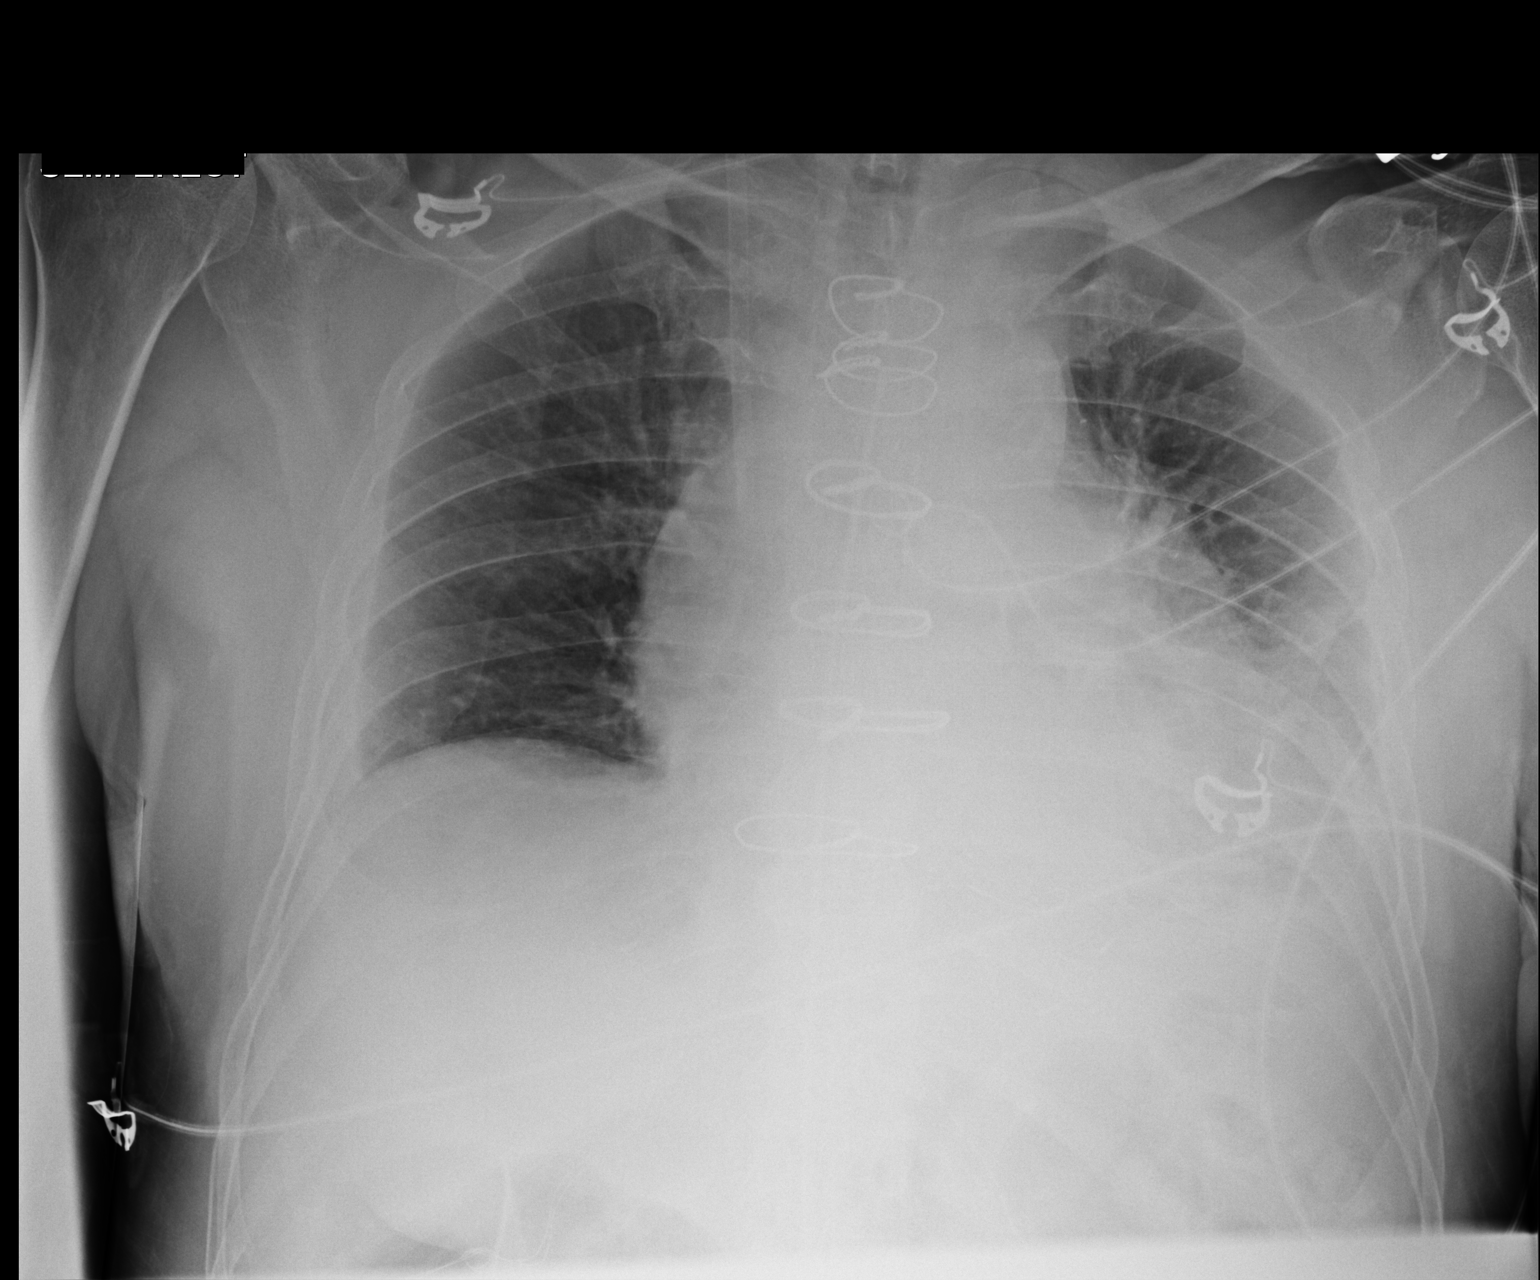

[1 of 1 positions shown; findings below may reference images not displayed]

FINDINGS: Sternotomy for CABG.  Cardiac silhouette upper normal in
size to slightly enlarged for technique and degree of inspiration.
Suboptimal inspiration post-extubation with development of dense
left lower lobe atelectasis.  Interval left chest tube removal with
no visible pneumothorax.  Swan-Ganz catheter removal.  Right
jugular introducer sheath tip projects over the mid SVC.
Nasogastric tube removed.
IMPRESSION: Development of dense left lower lobe atelectasis post-extubation.
No acute cardiopulmonary disease otherwise.  No pneumothorax after
left chest tube removal.

## 2014-06-26 IMAGING — CR DG CHEST 2V
1 series · 1 of 1 positions shown · non-contrast
Comparison: 02/12/2012.

CLINICAL DATA: Bypass surgery.

CHEST - 2 VIEW

[w chest lat]
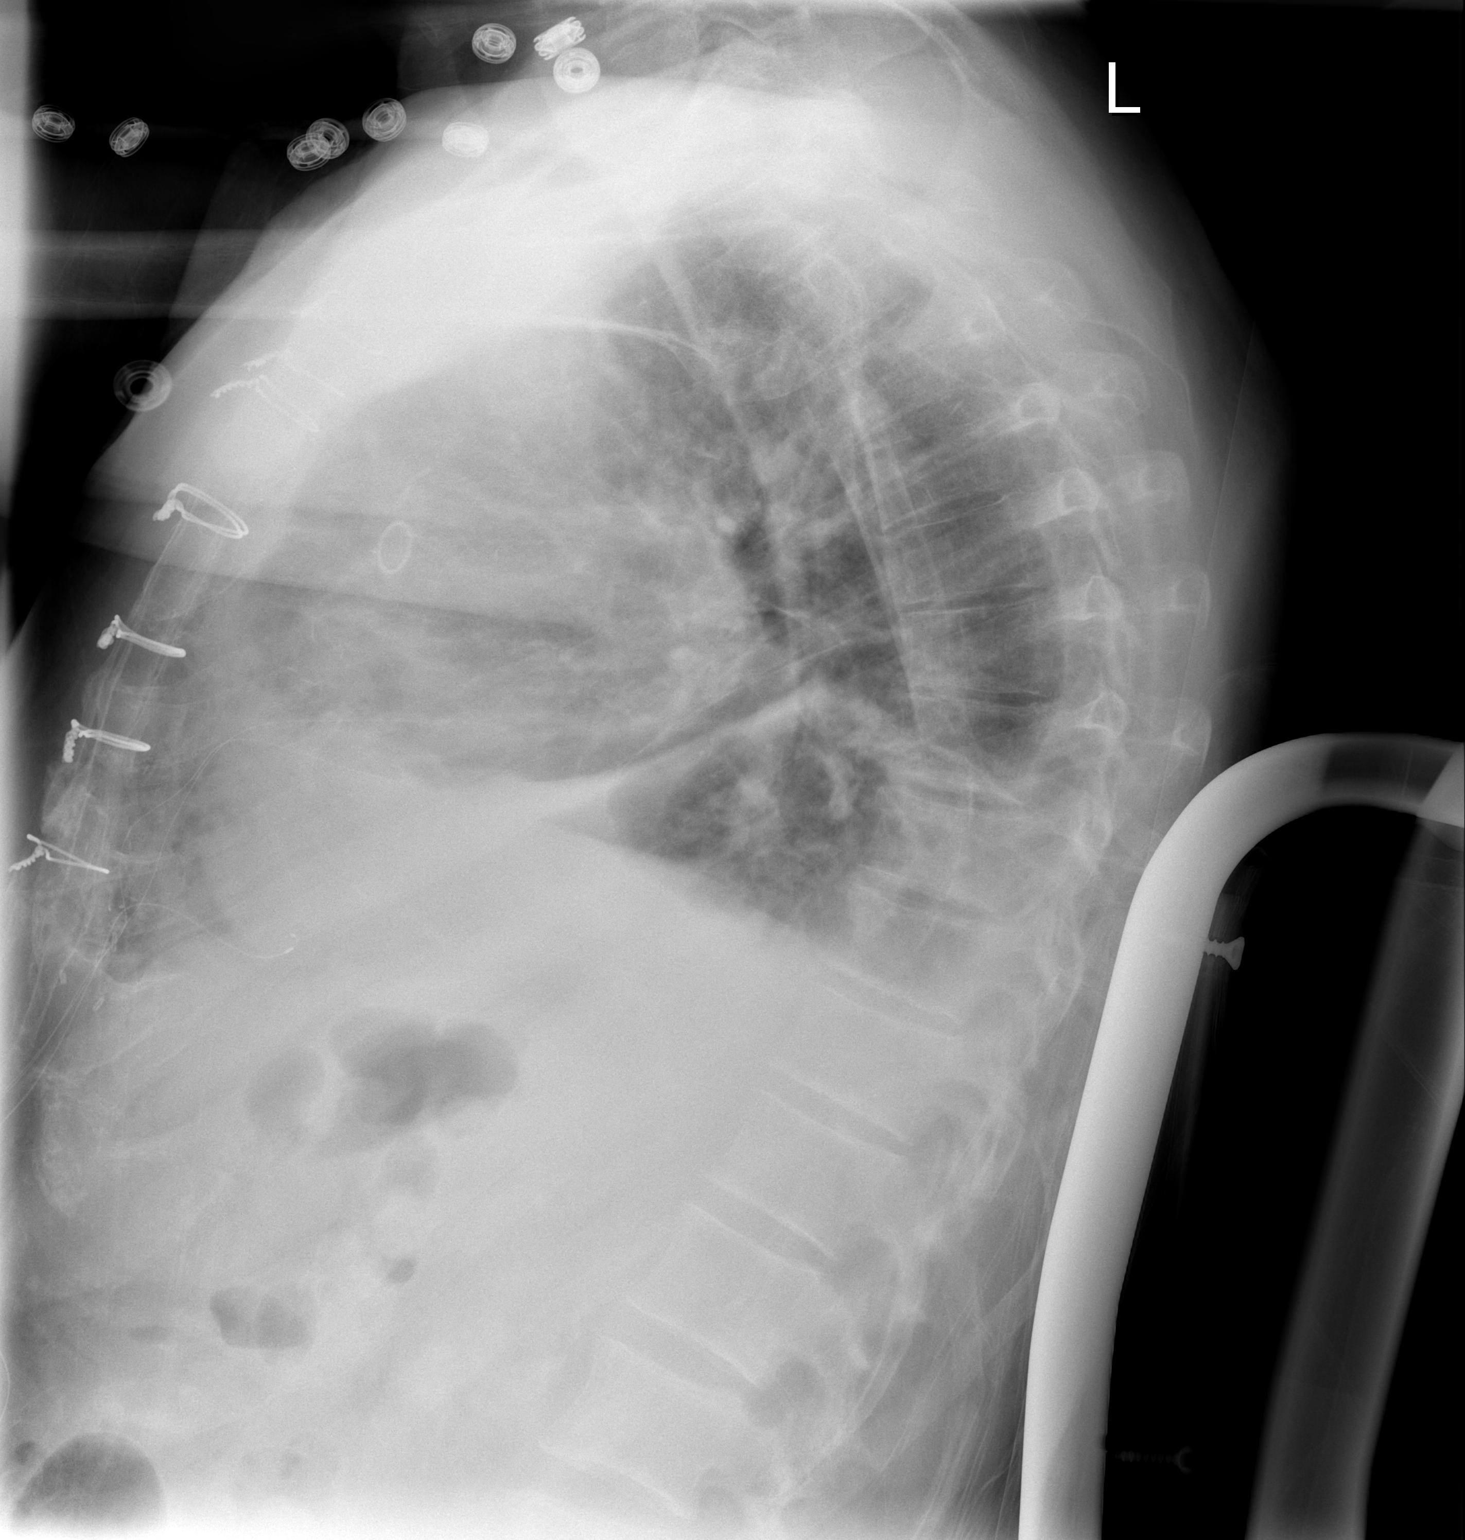

[1 of 1 positions shown; findings below may reference images not displayed]

FINDINGS: The right IJ Cordis has been removed.  The heart is
enlarged but stable.  Lower lung volumes with slight increase and
bibasilar atelectasis.  Small pleural effusions are noted.
IMPRESSION: 1.  Stable cardiac enlargement.
2.  Small bilateral pleural effusions and bibasilar atelectasis.

## 2014-07-04 ENCOUNTER — Encounter: Payer: Self-pay | Admitting: Vascular Surgery

## 2014-07-08 ENCOUNTER — Ambulatory Visit: Payer: Medicare Other | Admitting: Vascular Surgery

## 2014-08-25 ENCOUNTER — Encounter: Payer: Self-pay | Admitting: Vascular Surgery

## 2014-08-26 ENCOUNTER — Ambulatory Visit (INDEPENDENT_AMBULATORY_CARE_PROVIDER_SITE_OTHER): Payer: Medicare Other | Admitting: Vascular Surgery

## 2014-08-26 ENCOUNTER — Encounter: Payer: Self-pay | Admitting: Vascular Surgery

## 2014-08-26 VITALS — BP 151/77 | HR 84 | Temp 98.0°F | Resp 16 | Ht 70.0 in | Wt 178.0 lb

## 2014-08-26 DIAGNOSIS — I83893 Varicose veins of bilateral lower extremities with other complications: Secondary | ICD-10-CM | POA: Diagnosis not present

## 2014-08-26 NOTE — Progress Notes (Signed)
Filed Vitals:   08/26/14 1357 08/26/14 1358  BP: 166/71 151/77  Pulse: 84   Temp: 98 F (36.7 C)   Resp: 16   Height: 5\' 10"  (1.778 m)   Weight: 178 lb (80.74 kg)   SpO2: 100%

## 2014-08-26 NOTE — Progress Notes (Signed)
Subjective:     Patient ID: [name] [name], male   DOB: 1940/03/06, 74 y.o.   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 He is complicated. He had an initial laser ablation of the right great saphenous vein from the proximal calf to near the saphenofemoral junction in 2012 and a duplex scan one week later revealed the vein to be closed. He then returned in early 2016 with painful varicosities and anterior thigh. Duplex scan revealed the proximal portion of the saphenous vein to be open with gross reflux. He was treated with repeat laser ablation. He returned today to see a stab phlebectomy was indicated. He states that the veins are more tense and uncomfortable and he is concerned that the vein is opened again. He does not take anticoagulate. He's had no history of stasis ulcers bleeding but does have chronic mild edema.  Past Medical History  Diagnosis Date  . 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 TIA 12/03/2007  . CEREBROVASCULAR ACCIDENT, HX OF 09/20/2007  . HYPERLIPIDEMIA 12/18/2006  . PERIPHERAL VASCULAR DISEASE 09/20/2007  . BRADYCARDIA 09/20/2007  . DIVERTICULOSIS, COLON 09/20/2007  . COLONIC POLYPS, HX OF 12/18/2006  . CAD (coronary artery disease)   . Varicose veins     History  Substance Use Topics  . Smoking status: Never Smoker   . Smokeless tobacco: Never Used  . Alcohol Use: No    Family History  Problem Relation Age of Onset  . Heart disease Father   . Cancer Brother     Allergies  Allergen Reactions  . Niacin     REACTION: hives  . Simvastatin Other (See Comments)    myalgias     Current outpatient prescriptions:  .  aspirin 81 MG tablet, Take 1 tablet (81 mg total) by mouth daily., Disp: 30 tablet, Rfl: 6 .  atorvastatin (LIPITOR) 40 MG tablet, Take 1 tablet (40 mg total) by mouth daily., Disp: 30 tablet, Rfl: 11 .  BEE POLLEN PO, Take by mouth 2 (two) times daily., Disp: , Rfl:  .   nitroGLYCERIN (NITROSTAT) 0.4 MG SL tablet, Place 1 tablet (0.4 mg total) under the tongue every 5 (five) minutes as needed for chest pain., Disp: 25 tablet, Rfl: 6 .  Red Yeast Rice Extract (RED YEAST RICE PO), Take 1 tablet by mouth daily., Disp: , Rfl:  .  Saw Palmetto, Serenoa repens, (SAW PALMETTO PO), Take 1 tablet by mouth daily., Disp: , Rfl:   Filed Vitals:   08/26/14 1357 08/26/14 1358  BP: 166/71 151/77  Pulse: 84   Temp: 98 F (36.7 C)   Resp: 16   Height: 5\' 10"  (1.778 m)   Weight: 178 lb (80.74 kg)   SpO2: 100%     Body mass index is 25.54 kg/(m^2).           Review of Systems  Denies chest pain, dyspnea on exertion, PND, orthopnea, hemoptysis.     Objective:   Physical Exam BP 151/77 mmHg  Pulse 84  Temp(Src) 98 F (36.7 C)  Resp 16  Ht 5\' 10"  (1.778 m)  Wt 178 lb (80.74 kg)  BMI 25.54 kg/m2  SpO2 100%   Gen. Well-developed well-nourished male in no apparent distress alert and oriented 3 Lungs no rhonchi or wheezing Cardiovascular regular rhythm no murmurs  right leg with bulging varicosities beginning in the mid anterior thigh extending down around the knee into the medial calf. 1+ edema distally.  No hyperpigmentation or ulceration noted. 3 clusters Allis pedis pulse palpable.   today I performed a bedside ultrasound-sono site exam. 2 segments of the right great saphenous vein are patent with gross reflux. One is the most proximal segment about 6 or 8 cm in length. The second segment is beginning in the proximal to mid thigh extending down to the knee which also is large and has gross reflux. These appear to be supplying these varicosities.     Assessment:      previous laser ablation right great saphenous vein with now evidence of recanalization on bedside sono site ultrasound exam  Persistent painful varicosities     Plan:      patient will treat this with long-leg elastic compression stockings elevation and ibuprofen to see if he gets  symptomatic relief   If he returns we will get a formal venous duplex exam  We'll consider laser ablation of the great saphenous vein in 2 different areas from the knee to the proximal thigh and in the most proximal area near the  Saphenofemoral junction. It does appear there is complete closure of the vein between these 2 areas on my study today. Patient will possibly return in 3 months

## 2014-08-28 ENCOUNTER — Encounter: Payer: Self-pay | Admitting: Gastroenterology

## 2014-09-23 ENCOUNTER — Encounter: Payer: Self-pay | Admitting: Gastroenterology

## 2014-09-23 ENCOUNTER — Encounter: Payer: Self-pay | Admitting: Internal Medicine

## 2014-09-23 ENCOUNTER — Ambulatory Visit (INDEPENDENT_AMBULATORY_CARE_PROVIDER_SITE_OTHER): Payer: Medicare Other | Admitting: Internal Medicine

## 2014-09-23 VITALS — BP 118/80 | HR 56 | Temp 97.6°F | Ht 70.0 in | Wt 187.0 lb

## 2014-09-23 DIAGNOSIS — Z0000 Encounter for general adult medical examination without abnormal findings: Secondary | ICD-10-CM

## 2014-09-23 DIAGNOSIS — Z23 Encounter for immunization: Secondary | ICD-10-CM | POA: Diagnosis not present

## 2014-09-23 NOTE — Patient Instructions (Addendum)
You had the flu shot today, Td (tetanus shot), and the Pneumovax  We will plan on the Prevnar 13 pneumonia shot for next year  Please continue all other medications as before, and refills have been done if requested.  Please have the pharmacy call with any other refills you may need.  Please continue your efforts at being more active, low cholesterol diet, and weight control.  You are otherwise up to date with prevention measures today.  Please keep your appointments with your specialists as you may have planned  You will be contacted regarding the referral for: colonoscopy  Please go to the LAB in the Basement (turn left off the elevator) for the tests to be done today  You will be contacted by phone if any changes need to be made immediately.  Otherwise, you will receive a letter about your results with an explanation, but please check with MyChart first.  Please remember to sign up for MyChart if you have not done so, as this will be important to you in the future with finding out test results, communicating by private email, and scheduling acute appointments online when needed.  Please return in 1 year for your yearly visit, or sooner if needed, with Lab testing done 3-5 days before

## 2014-09-23 NOTE — Assessment & Plan Note (Signed)

## 2014-09-23 NOTE — Progress Notes (Signed)
Pre visit review using our clinic review tool, if applicable. No additional management support is needed unless otherwise documented below in the visit note. 

## 2014-09-23 NOTE — Progress Notes (Signed)
Subjective:    Patient ID: [name] [name], male    DOB: December 09, 1940, 74 y.o.   MRN: [id number]  HPI    Here for wellness and f/u;  Overall doing ok;  Pt denies Chest pain, worsening SOB, DOE, wheezing, orthopnea, PND, worsening LE edema, palpitations, dizziness or syncope.  Pt denies neurological change such as new headache, facial or extremity weakness.  Pt denies polydipsia, polyuria, or low sugar symptoms. Pt states overall good compliance with treatment and medications, good tolerability, and has been trying to follow appropriate diet.  Pt denies worsening depressive symptoms, suicidal ideation or panic. No fever, night sweats, wt loss, loss of appetite, or other constitutional symptoms.  Pt states good ability with ADL's, has low fall risk, home safety reviewed and adequate, no other significant changes in hearing or vision, and only occasionally active with exercise. Sees urology yearly.  No new compliants Past Medical History  Diagnosis Date  . 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 TIA 12/03/2007  . CEREBROVASCULAR ACCIDENT, HX OF 09/20/2007  . HYPERLIPIDEMIA 12/18/2006  . PERIPHERAL VASCULAR DISEASE 09/20/2007  . BRADYCARDIA 09/20/2007  . DIVERTICULOSIS, COLON 09/20/2007  . COLONIC POLYPS, HX OF 12/18/2006  . CAD (coronary artery disease)   . Varicose veins    Past Surgical History  Procedure Laterality Date  . Coronary artery bypass graft  02/10/2012    Procedure: CORONARY ARTERY BYPASS GRAFTING (CABG);  Surgeon: Ivin Poot, MD;  Location: [place] [facility];  Service: Open Heart Surgery;  Laterality: N/A;  CABG x three, using Bilateral mammary artery harvest and left arm radial harvest  . Intraoperative transesophageal echocardiogram  02/10/2012    Procedure: INTRAOPERATIVE TRANSESOPHAGEAL ECHOCARDIOGRAM;  Surgeon: Ivin Poot, MD;  Location: [facility];  Service: Open Heart Surgery;  Laterality: N/A;  . Radial artery harvest  02/10/2012    Procedure: RADIAL ARTERY HARVEST;  Surgeon: Ivin Poot,  MD;  Location: [facility];  Service: Open Heart Surgery;  Laterality: Left;  . Left heart catheterization with coronary angiogram N/A 02/10/2012    Procedure: LEFT HEART CATHETERIZATION WITH CORONARY ANGIOGRAM;  Surgeon: Burnell Blanks, MD;  Location: [facility] CATH LAB;  Service: Cardiovascular;  Laterality: N/A;    reports that he has never smoked. He has never used smokeless tobacco. He reports that he does not drink alcohol or use illicit drugs. family history includes Cancer in his brother; Heart disease in his father. Allergies  Allergen Reactions  . Niacin     REACTION: hives  . Simvastatin Other (See Comments)    myalgias   Current Outpatient Prescriptions on File Prior to Visit  Medication Sig Dispense Refill  . aspirin 81 MG tablet Take 1 tablet (81 mg total) by mouth daily. 30 tablet 6  . BEE POLLEN PO Take by mouth 2 (two) times daily.    . Red Yeast Rice Extract (RED YEAST RICE PO) Take 1 tablet by mouth daily.    . Saw Palmetto, Serenoa repens, (SAW PALMETTO PO) Take 1 tablet by mouth daily.    Marland Kitchen atorvastatin (LIPITOR) 40 MG tablet Take 1 tablet (40 mg total) by mouth daily. (Patient not taking: Reported on 09/23/2014) 30 tablet 11  . nitroGLYCERIN (NITROSTAT) 0.4 MG SL tablet Place 1 tablet (0.4 mg total) under the tongue every 5 (five) minutes as needed for chest pain. (Patient not taking: Reported on 09/23/2014) 25 tablet 6   No current facility-administered medications on file prior to visit.    .Review of Systems Constitutional: Negative for increased diaphoresis,  other activity, appetite or siginficant weight change other than noted HENT: Negative for worsening hearing loss, ear pain, facial swelling, mouth sores and neck stiffness.   Eyes: Negative for other worsening pain, redness or visual disturbance.  Respiratory: Negative for shortness of breath and wheezing  Cardiovascular: Negative for chest pain and palpitations.  Gastrointestinal: Negative for diarrhea, blood in  stool, abdominal distention or other pain Genitourinary: Negative for hematuria, flank pain or change in urine volume.  Musculoskeletal: Negative for myalgias or other joint complaints.  Skin: Negative for color change and wound or drainage.  Neurological: Negative for syncope and numbness. other than noted Hematological: Negative for adenopathy. or other swelling Psychiatric/Behavioral: Negative for hallucinations, SI, self-injury, decreased concentration or other worsening agitation.      Objective:   Physical Exam BP 118/80 mmHg  Pulse 56  Temp(Src) 97.6 F (36.4 C) (Oral)  Ht 5\' 10"  (1.778 m)  Wt 187 lb (84.823 kg)  BMI 26.83 kg/m2  SpO2 98% VS noted,  Constitutional: Pt is oriented to person, place, and time. Appears well-developed and well-nourished, in no significant distress Head: Normocephalic and atraumatic.  Right Ear: External ear normal.  Left Ear: External ear normal.  Nose: Nose normal.  Mouth/Throat: Oropharynx is clear and moist.  Eyes: Conjunctivae and EOM are normal. Pupils are equal, round, and reactive to light.  Neck: Normal range of motion. Neck supple. No JVD present. No tracheal deviation present or significant neck LA or mass Cardiovascular: Normal rate, regular rhythm, normal heart sounds and intact distal pulses.   Pulmonary/Chest: Effort normal and breath sounds without rales or wheezing  Abdominal: Soft. Bowel sounds are normal. NT. No HSM  Musculoskeletal: Normal range of motion. Exhibits no edema.  Lymphadenopathy:  Has no cervical adenopathy.  Neurological: Pt is alert and oriented to person, place, and time. Pt has normal reflexes. No cranial nerve deficit. Motor grossly intact Skin: Skin is warm and dry. No rash noted.  Psychiatric:  Has normal mood and affect. Behavior is normal.     Assessment & Plan:

## 2014-09-23 NOTE — Addendum Note (Signed)
Addended by: Lyman Bishop on: 09/23/2014 02:49 PM   Modules accepted: Orders

## 2014-09-25 ENCOUNTER — Encounter: Payer: Self-pay | Admitting: Internal Medicine

## 2014-09-25 ENCOUNTER — Other Ambulatory Visit (INDEPENDENT_AMBULATORY_CARE_PROVIDER_SITE_OTHER): Payer: Medicare Other

## 2014-09-25 DIAGNOSIS — Z0000 Encounter for general adult medical examination without abnormal findings: Secondary | ICD-10-CM | POA: Diagnosis not present

## 2014-09-25 LAB — URINALYSIS, ROUTINE W REFLEX MICROSCOPIC
Bilirubin Urine: NEGATIVE
Hgb urine dipstick: NEGATIVE
KETONES UR: NEGATIVE
Leukocytes, UA: NEGATIVE
NITRITE: NEGATIVE
PH: 7 (ref 5.0–8.0)
SPECIFIC GRAVITY, URINE: 1.01 (ref 1.000–1.030)
TOTAL PROTEIN, URINE-UPE24: NEGATIVE
UROBILINOGEN UA: 0.2 (ref 0.0–1.0)
Urine Glucose: NEGATIVE

## 2014-09-25 LAB — CBC WITH DIFFERENTIAL/PLATELET
BASOS ABS: 0 10*3/uL (ref 0.0–0.1)
BASOS PCT: 0.4 % (ref 0.0–3.0)
EOS ABS: 0.2 10*3/uL (ref 0.0–0.7)
Eosinophils Relative: 3.6 % (ref 0.0–5.0)
HCT: 49.2 % (ref 39.0–52.0)
HEMOGLOBIN: 16.8 g/dL (ref 13.0–17.0)
LYMPHS PCT: 13.7 % (ref 12.0–46.0)
Lymphs Abs: 0.9 10*3/uL (ref 0.7–4.0)
MCHC: 34.1 g/dL (ref 30.0–36.0)
MCV: 97.7 fl (ref 78.0–100.0)
MONO ABS: 0.8 10*3/uL (ref 0.1–1.0)
Monocytes Relative: 12 % (ref 3.0–12.0)
Neutro Abs: 4.7 10*3/uL (ref 1.4–7.7)
Neutrophils Relative %: 70.3 % (ref 43.0–77.0)
Platelets: 254 10*3/uL (ref 150.0–400.0)
RBC: 5.03 Mil/uL (ref 4.22–5.81)
RDW: 12.7 % (ref 11.5–15.5)
WBC: 6.7 10*3/uL (ref 4.0–10.5)

## 2014-09-25 LAB — BASIC METABOLIC PANEL
BUN: 14 mg/dL (ref 6–23)
CHLORIDE: 104 meq/L (ref 96–112)
CO2: 31 meq/L (ref 19–32)
CREATININE: 1.07 mg/dL (ref 0.40–1.50)
Calcium: 9.1 mg/dL (ref 8.4–10.5)
GFR: 71.82 mL/min (ref >60.00)
GLUCOSE: 91 mg/dL (ref 70–99)
POTASSIUM: 4.8 meq/L (ref 3.5–5.1)
Sodium: 139 mEq/L (ref 135–145)

## 2014-09-25 LAB — HEPATIC FUNCTION PANEL
ALBUMIN: 4.1 g/dL (ref 3.5–5.2)
ALT: 13 U/L (ref 0–53)
AST: 21 U/L (ref 0–37)
Alkaline Phosphatase: 68 U/L (ref 39–117)
BILIRUBIN TOTAL: 0.5 mg/dL (ref 0.2–1.2)
Bilirubin, Direct: 0.1 mg/dL (ref 0.0–0.3)
TOTAL PROTEIN: 6.9 g/dL (ref 6.0–8.3)

## 2014-09-25 LAB — LIPID PANEL
CHOL/HDL RATIO: 5
Cholesterol: 212 mg/dL — ABNORMAL HIGH (ref 0–200)
HDL: 45.9 mg/dL (ref >39.00)
LDL CALC: 148 mg/dL — AB (ref 0–99)
NONHDL: 166.38
TRIGLYCERIDES: 92 mg/dL (ref 0.0–149.0)
VLDL: 18.4 mg/dL (ref 0.0–40.0)

## 2014-09-25 LAB — TSH: TSH: 3.12 u[IU]/mL (ref 0.35–4.50)

## 2014-09-25 LAB — PSA: PSA: 9.46 ng/mL — ABNORMAL HIGH (ref 0.10–4.00)

## 2014-11-13 ENCOUNTER — Ambulatory Visit (AMBULATORY_SURGERY_CENTER): Payer: Self-pay | Admitting: *Deleted

## 2014-11-13 VITALS — Ht 70.0 in | Wt 185.0 lb

## 2014-11-13 DIAGNOSIS — Z800 Family history of malignant neoplasm of digestive organs: Secondary | ICD-10-CM

## 2014-11-13 DIAGNOSIS — Z86010 Personal history of colonic polyps: Secondary | ICD-10-CM

## 2014-11-13 MED ORDER — NA SULFATE-K SULFATE-MG SULF 17.5-3.13-1.6 GM/177ML PO SOLN: 1.0000 | Freq: Once | ORAL | Status: DC

## 2014-11-13 NOTE — Progress Notes (Signed)
No egg or soy allergy No issues with past sedation No diet pills No home 02 use  emmi declined Medication Samples have been provided to the patient.  Drug name: suprep  Qty: 1  LOT: [id number]  Exp.Date: 13-18  The patient has been instructed regarding the correct time, dose, and frequency of taking this medication, including desired effects and most common side effects.   [name] [name] 10:26 AM 11/13/2014

## 2014-11-19 ENCOUNTER — Telehealth: Payer: Self-pay | Admitting: Gastroenterology

## 2014-11-19 NOTE — Telephone Encounter (Signed)
Spoke to pt's wife and told her it is ok to start the 6pm prep at 8 pm but her husband may be up later having stools but instructed he has to do the Friday am dose at 830 am which is 5 hours before procedure time. She verbalized understanding of instructions given today  Lelan Pons PV

## 2014-11-21 ENCOUNTER — Encounter: Payer: Self-pay | Admitting: Gastroenterology

## 2014-11-21 ENCOUNTER — Ambulatory Visit (AMBULATORY_SURGERY_CENTER): Payer: Medicare Other | Admitting: Gastroenterology

## 2014-11-21 VITALS — BP 171/86 | HR 45 | Temp 97.2°F | Resp 27 | Ht 70.0 in | Wt 185.0 lb

## 2014-11-21 DIAGNOSIS — Z86010 Personal history of colonic polyps: Secondary | ICD-10-CM

## 2014-11-21 DIAGNOSIS — Z800 Family history of malignant neoplasm of digestive organs: Secondary | ICD-10-CM | POA: Diagnosis not present

## 2014-11-21 MED ORDER — SODIUM CHLORIDE 0.9 % IV SOLN: 500.0000 mL | INTRAVENOUS | Status: DC

## 2014-11-21 NOTE — Patient Instructions (Addendum)
YOU HAD AN ENDOSCOPIC PROCEDURE TODAY AT [facility]:   Refer to the procedure report that was given to you for any specific questions about what was found during the examination.  If the procedure report does not answer your questions, please call your gastroenterologist to clarify.  If you requested that your care partner not be given the details of your procedure findings, then the procedure report has been included in a sealed envelope for you to review at your convenience later.  YOU SHOULD EXPECT: Some feelings of bloating in the abdomen. Passage of more gas than usual.  Walking can help get rid of the air that was put into your GI tract during the procedure and reduce the bloating. If you had a lower endoscopy (such as a colonoscopy or flexible sigmoidoscopy) you may notice spotting of blood in your stool or on the toilet paper. If you underwent a bowel prep for your procedure, you may not have a normal bowel movement for a few days.  Please Note:  You might notice some irritation and congestion in your nose or some drainage.  This is from the oxygen used during your procedure.  There is no need for concern and it should clear up in a day or so.  SYMPTOMS TO REPORT IMMEDIATELY:   Following lower endoscopy (colonoscopy or flexible sigmoidoscopy):  Excessive amounts of blood in the stool  Significant tenderness or worsening of abdominal pains  Swelling of the abdomen that is new, acute  Fever of 100F or higher   For urgent or emergent issues, a gastroenterologist can be reached at any hour by calling [phone].   DIET: Your first meal following the procedure should be a small meal and then it is ok to progress to your normal diet. Heavy or fried foods are harder to digest and may make you feel nauseous or bloated.  Likewise, meals heavy in dairy and vegetables can increase bloating.  Drink plenty of fluids but you should avoid alcoholic beverages for 24  hours.  ACTIVITY:  You should plan to take it easy for the rest of today and you should NOT DRIVE or use heavy machinery until tomorrow (because of the sedation medicines used during the test).    FOLLOW UP: Our staff will call the number listed on your records the next business day following your procedure to check on you and address any questions or concerns that you may have regarding the information given to you following your procedure. If we do not reach you, we will leave a message.  However, if you are feeling well and you are not experiencing any problems, there is no need to return our call.  We will assume that you have returned to your regular daily activities without incident.  If any biopsies were taken you will be contacted by phone or by letter within the next 1-3 weeks.  Please call us at [phone] if you have not heard about the biopsies in 3 weeks.    SIGNATURES/CONFIDENTIALITY: You and/or your care partner have signed paperwork which will be entered into your electronic medical record.  These signatures attest to the fact that that the information above on your After Visit Summary has been reviewed and is understood.  Full responsibility of the confidentiality of this discharge information lies with you and/or your care-partner.  Repeat Colonoscopy in 5 years Normal screening

## 2014-11-21 NOTE — Progress Notes (Signed)
Called to room to assist during endoscopic procedure.  Patient ID and intended procedure confirmed with present staff. Received instructions for my participation in the procedure from the performing physician.  

## 2014-11-21 NOTE — Progress Notes (Signed)
To recovery, report to Smith, RN, VSS 

## 2014-11-24 ENCOUNTER — Telehealth: Payer: Self-pay

## 2014-11-24 NOTE — Op Note (Addendum)
[place]  [address] [place], [id number]   COLONOSCOPY PROCEDURE REPORT  PATIENT: [name] [name], [name] [name]  MR#: [id number] BIRTHDATE: 02-Apr-1940 , 43  yrs. old GENDER: male ENDOSCOPIST: Milus Banister, MD PROCEDURE DATE:  11/21/2014 PROCEDURE:   Colonoscopy, surveillance First Screening Colonoscopy - Avg.  risk and is 50 yrs.  old or older - No.  Prior Negative Screening - Now for repeat screening. N/A  History of Adenoma - Now for follow-up colonoscopy & has been > or = to 3 yrs.  Yes hx of adenoma.  Has been 3 or more years since last colonoscopy.  high risk ASA CLASS:   Class II INDICATIONS:Surveillance due to prior colonic neoplasia, FH Colon or Rectal Adenocarcinoma, and (he has had several adenomatous polyps, most recently 2011 TVA Dr.  Sharlett Iles, also his brother died of colon cancer in his 64s). MEDICATIONS: Monitored anesthesia care and Propofol 200 mg IV  DESCRIPTION OF PROCEDURE:   After the risks benefits and alternatives of the procedure were thoroughly explained, informed consent was obtained.  The digital rectal exam revealed no abnormalities of the rectum.   The LB [id number]  endoscope was introduced through the anus and advanced to the cecum, which was identified by both the appendix and ileocecal valve. No adverse events experienced.   The quality of the prep was excellent.  The instrument was then slowly withdrawn as the colon was fully examined. Estimated blood loss is zero unless otherwise noted in this procedure report.   COLON FINDINGS: A normal appearing cecum, ileocecal valve, and appendiceal orifice were identified.  The ascending, transverse, descending, sigmoid colon, and rectum appeared unremarkable. Retroflexed views revealed no abnormalities. The time to cecum = 3.6 Withdrawal time = 12.6   The scope was withdrawn and the procedure completed. COMPLICATIONS: There were no immediate complications.  ENDOSCOPIC  IMPRESSION: Normal colonoscopy  RECOMMENDATIONS: Given your significant family history of colon cancer (brother), you should have a repeat colonoscopy in 5 years  eSigned:  Milus Banister, MD 11/21/2014 1:52 PM

## 2014-11-24 NOTE — Telephone Encounter (Signed)
  Follow up Call-  Call back number 11/21/2014  Post procedure Call Back phone  # [phone]  Permission to leave phone message Yes     Patient questions:  Do you have a fever, pain , or abdominal swelling? No. Pain Score  0 *  Have you tolerated food without any problems? Yes.    Have you been able to return to your normal activities? Yes.    Do you have any questions about your discharge instructions: Diet   No. Medications  No. Follow up visit  No.  Do you have questions or concerns about your Care? No.  Actions: * If pain score is 4 or above: No action needed, pain <4.   Per the pt he c/o a sore throat on Friday and sat.  He gargled with salt warm water and he said he was 110% this am.  He questioned if "too much o2 was given".  I explained that we administer o2 because of the deep sedation he received.  Also I explained that the o2 can cause drying of the membrane and can cause congestion too.  He reported he was 110% this am and no sx or problems.  maw

## 2015-04-21 DIAGNOSIS — T7840XA Allergy, unspecified, initial encounter: Secondary | ICD-10-CM | POA: Insufficient documentation

## 2015-04-22 ENCOUNTER — Other Ambulatory Visit: Payer: Self-pay | Admitting: *Deleted

## 2015-04-22 ENCOUNTER — Ambulatory Visit (INDEPENDENT_AMBULATORY_CARE_PROVIDER_SITE_OTHER): Payer: Medicare Other | Admitting: Cardiovascular Disease

## 2015-04-22 ENCOUNTER — Encounter: Payer: Self-pay | Admitting: Cardiovascular Disease

## 2015-04-22 VITALS — BP 160/80 | HR 52 | Wt 191.0 lb

## 2015-04-22 DIAGNOSIS — E785 Hyperlipidemia, unspecified: Secondary | ICD-10-CM | POA: Diagnosis not present

## 2015-04-22 DIAGNOSIS — I2510 Atherosclerotic heart disease of native coronary artery without angina pectoris: Secondary | ICD-10-CM

## 2015-04-22 MED ORDER — ROSUVASTATIN CALCIUM 5 MG PO TABS: ORAL_TABLET | ORAL | Status: DC

## 2015-04-22 NOTE — Progress Notes (Signed)
Chief Complaint  Patient presents with  . Follow-up     History of Present Illness: 75 yo male with history of CAD s/p 3V CABG 02/10/12 (LIMA to LAD, RIMA to RCA, free radial to OM), hyperlipidemia, prostate cancer, CVA here today for cardiac follow up. He has been followed in the past by Dr. Lia Foyer. He presented with unstable angina in January 2014. Cardiac cath 02/10/12 with 99% ostial left main stenosis, 99% ostial RCA stenosis. 3V CABG on 02/10/12. He has done well following his CABG. He has not tolerated statins. He has tried Lipitor.   He is here today for follow up. He feels well. No chest pain or SOB. He is active. 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   Primary Care Physician: Cathlean Cower   Past Medical History  Diagnosis Date  . TIA 12/03/2007  . CEREBROVASCULAR ACCIDENT, HX OF 09/20/2007  . HYPERLIPIDEMIA 12/18/2006  . PERIPHERAL VASCULAR DISEASE 09/20/2007  . BRADYCARDIA 09/20/2007  . DIVERTICULOSIS, COLON 09/20/2007  . COLONIC POLYPS, HX OF 12/18/2006  . CAD (coronary artery disease)   . Varicose veins   . ADENOCARCINOMA, 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    no evidence of prostate cancer per pt 11-13-14  . Allergy     Past Surgical History  Procedure Laterality Date  . Coronary artery bypass graft  02/10/2012    Procedure: CORONARY ARTERY BYPASS GRAFTING (CABG);  Surgeon: Ivin Poot, MD;  Location: [place];  Service: Open Heart Surgery;  Laterality: N/A;  CABG x three, using Bilateral mammary artery harvest and left arm radial harvest  . Intraoperative transesophageal echocardiogram  02/10/2012    Procedure: INTRAOPERATIVE TRANSESOPHAGEAL ECHOCARDIOGRAM;  Surgeon: Ivin Poot, MD;  Location: [place];  Service: Open Heart Surgery;  Laterality: N/A;  . Radial artery harvest  02/10/2012    Procedure: RADIAL ARTERY HARVEST;  Surgeon: Ivin Poot, MD;  Location: [place];  Service: Open Heart Surgery;  Laterality: Left;  . Left heart catheterization with  coronary angiogram N/A 02/10/2012    Procedure: LEFT HEART CATHETERIZATION WITH CORONARY ANGIOGRAM;  Surgeon: Burnell Blanks, MD;  Location: [facility] CATH LAB;  Service: Cardiovascular;  Laterality: N/A;  . Colonoscopy  06-05-2009    last colon 06-05-2009 w/ patterson-polyps  . Polypectomy      hx TA colon polyps    Current Outpatient Prescriptions  Medication Sig Dispense Refill  . aspirin 81 MG tablet Take 1 tablet (81 mg total) by mouth daily. 30 tablet 6  . BEE POLLEN PO Take by mouth 2 (two) times daily.    . Multiple Vitamin (MULTIVITAMIN) tablet Take 1 tablet by mouth daily. Centrum silver- takes 5 days a week    . Red Yeast Rice Extract (RED YEAST RICE PO) Take 1 tablet by mouth daily.    . Saw Palmetto, Serenoa repens, (SAW PALMETTO PO) Take 1 tablet by mouth daily.    . vitamin C (ASCORBIC ACID) 500 MG tablet Take 500 mg by mouth daily. 5 days a week    . rosuvastatin (CRESTOR) 5 MG tablet Take one tablet by mouth three days per week. 15 tablet 11   No current facility-administered medications for this visit.    Allergies  Allergen Reactions  . Niacin     REACTION: hives  . Simvastatin Other (See Comments)    myalgias    Social History   Social History  . Marital Status: Married    Spouse Name: N/A  . Number  of Children: N/A  . Years of Education: N/A   Occupational History  . Not on file.   Social History Main Topics  . Smoking status: Never Smoker   . Smokeless tobacco: Never Used  . Alcohol Use: No  . Drug Use: No  . Sexual Activity: Not on file   Other Topics Concern  . Not on file   Social History Narrative    Family History  Problem Relation Age of Onset  . Heart disease Father   . Cancer Brother   . Colon cancer Brother 36    died age 103-found out 6 months before passing he had colon cancer    Review of Systems:  As stated in the HPI and otherwise negative.   BP 160/80 mmHg  Pulse 52  Wt 191 lb (86.637 kg)  SpO2 98%  Physical  Examination: General: Well developed, well nourished, NAD HEENT: OP clear, mucus membranes moist SKIN: warm, dry. No rashes. Neuro: No focal deficits Musculoskeletal: Muscle strength 5/5 all ext Psychiatric: Mood and affect normal Neck: No JVD, no carotid bruits, no thyromegaly, no lymphadenopathy. Lungs:Clear bilaterally, no wheezes, rhonci, crackles Cardiovascular: Regular rhythm. Loletha Grayer. No murmurs, gallops or rubs. Abdomen:Soft. Bowel sounds present. Non-tender.  Extremities: No lower extremity edema. Pulses are 2 + in the bilateral DP/PT.  Cardiac cath 02/10/12:  Left main: Distal 99% stenosis.  Left Anterior Descending Artery: Large caliber vessel that courses to the apex. There is calcification in the proximal vessel. The proximal vessel has a 90% stenosis. The mid vessel has serial 50% stenoses. The distal vessel has mild plaque disease. The diagonal branch is small to moderate sized and has mild plaque disease.  Circumflex Artery: Large caliber, calcified vessel with 30% stenosis in the proximal vessel. The obtuse marginal is moderate in caliber and has 30% serial stenoses.  Right Coronary Artery: Large, dominant vessel with 99% ostial stenosis. The mid vessel has serial 30% stenoses with moderate calcification. The distal vessel has a 40-50% stenosis just before the bifurcation. The PDA and posterolateral branches are moderate in caliber with diffuse plaque but no focally obstructive lesions.  Left Ventricular Angiogram: LVEF=60-65%.   EKG:  EKG is ordered today. The ekg ordered today demonstrates Sinus brady, rate 52 bpm.   Recent Labs: 09/25/2014: ALT 13; BUN 14; Creatinine, Ser 1.07; Hemoglobin 16.8; Platelets 254.0; Potassium 4.8; Sodium 139; TSH 3.12   Lipid Panel    Component Value Date/Time   CHOL 212* 09/25/2014 0810   TRIG 92.0 09/25/2014 0810   TRIG 61 12/16/2005 0753   HDL 45.90 09/25/2014 0810   CHOLHDL 5 09/25/2014 0810   CHOLHDL 4.1 CALC 12/16/2005 0753   VLDL  18.4 09/25/2014 0810   LDLCALC 148* 09/25/2014 0810   LDLDIRECT 179.4 12/23/2010 0935   LDLDIRECT 190.2 11/04/2005 1229     Wt Readings from Last 3 Encounters:  04/22/15 191 lb (86.637 kg)  11/21/14 185 lb (83.915 kg)  11/13/14 185 lb (83.915 kg)     Other studies Reviewed: Additional studies/ records that were reviewed today include: . Review of the above records demonstrates:    Assessment and Plan:   1. CAD: s/p CABG. He has been doing well. Continue ASA. He has not tolerated statins. He did not tolerate beta blockers secondary to bradycardia.   2. Hyperlipidemia: He has not tolerated Lipitor. Will try Crestor 5mg  three days per week. Repeat lipids and LFTs in 12 weeks.    Current medicines are reviewed at length with the patient today.  The patient does not have concerns regarding medicines.  The following changes have been made:  no change  Labs/ tests ordered today include:   Orders Placed This Encounter  Procedures  . Lipid Profile  . Hepatic function panel  . EKG 12-Lead     Disposition:   FU with me in 12  months   Signed, Lauree Chandler, MD 04/22/2015 5:19 PM    [facility] [address], [place], [place]  [address] Phone: [phone]; Fax: [phone]

## 2015-04-22 NOTE — Patient Instructions (Addendum)
Medication Instructions:  Your physician has recommended you make the following change in your medication: Start Rosuvastatin 5 mg by mouth three days per week    Labwork: Your physician recommends that you return for fasting lab work in: 12 weeks. Scheduled for June 26,2017.  The lab opens at 7:30 AM   Testing/Procedures: none  Follow-Up: Your physician wants you to follow-up in: 12 months.  You will receive a reminder letter in the mail two months in advance. If you don't receive a letter, please call our office to schedule the follow-up appointment.   Any Other Special Instructions Will Be Listed Below (If Applicable).     If you need a refill on your cardiac medications before your next appointment, please call your pharmacy.

## 2015-07-13 ENCOUNTER — Other Ambulatory Visit (INDEPENDENT_AMBULATORY_CARE_PROVIDER_SITE_OTHER): Payer: Medicare Other | Admitting: *Deleted

## 2015-07-13 DIAGNOSIS — E785 Hyperlipidemia, unspecified: Secondary | ICD-10-CM | POA: Diagnosis not present

## 2015-07-13 LAB — LIPID PANEL
CHOL/HDL RATIO: 4.4 ratio (ref <=5.0)
Cholesterol: 197 mg/dL (ref 125–200)
HDL: 45 mg/dL (ref >=40)
LDL Cholesterol: 133 mg/dL — ABNORMAL HIGH (ref <130)
Triglycerides: 97 mg/dL (ref <150)
VLDL: 19 mg/dL (ref <30)

## 2015-07-13 LAB — HEPATIC FUNCTION PANEL
ALT: 12 U/L (ref 9–46)
AST: 19 U/L (ref 10–35)
Albumin: 3.9 g/dL (ref 3.6–5.1)
Alkaline Phosphatase: 57 U/L (ref 40–115)
BILIRUBIN DIRECT: 0.1 mg/dL (ref <=0.2)
BILIRUBIN TOTAL: 0.5 mg/dL (ref 0.2–1.2)
Indirect Bilirubin: 0.4 mg/dL (ref 0.2–1.2)
Total Protein: 6.3 g/dL (ref 6.1–8.1)

## 2015-07-15 ENCOUNTER — Telehealth: Payer: Self-pay | Admitting: Cardiovascular Disease

## 2015-07-15 DIAGNOSIS — E785 Hyperlipidemia, unspecified: Secondary | ICD-10-CM

## 2015-07-15 NOTE — Telephone Encounter (Signed)
Agree. Thanks

## 2015-07-15 NOTE — Telephone Encounter (Signed)
Follow-up     Returning the nurses call, no other information provided

## 2015-07-15 NOTE — Addendum Note (Signed)
Addended by: Thompson Grayer on: 07/15/2015 11:26 AM   Modules accepted: Medications

## 2015-07-15 NOTE — Telephone Encounter (Signed)
Spoke with pt and reviewed lab results with him.  He has been taking Crestor at 8 AM and by 6 PM he is very tired. By 8 PM he is "dead on his feet." This is the same problem he had with simvastatin. He is not having muscle pains.  He stopped red yeast rice while taking Crestor. He noticed on days he did not take Crestor he did not feel tired.  He stopped taking Crestor completely about 2 weeks ago and is doing better. I talked with him about changing the time he takes Crestor but he is afraid taking at a different time would affect him at other times during the day.  He would like to stop Crestor. He states he will resume red yeast rice.  Directions are to take 1 tablet three times daily. When he took in past he only took one daily. He is going to increase to one tablet twice daily.  He would like to have lab work checked in about 6 months.  Appt made for lab work on 01/04/16

## 2015-12-25 ENCOUNTER — Other Ambulatory Visit (INDEPENDENT_AMBULATORY_CARE_PROVIDER_SITE_OTHER): Payer: Medicare Other

## 2015-12-25 ENCOUNTER — Encounter: Payer: Self-pay | Admitting: Internal Medicine

## 2015-12-25 ENCOUNTER — Ambulatory Visit (INDEPENDENT_AMBULATORY_CARE_PROVIDER_SITE_OTHER): Payer: Medicare Other | Admitting: Internal Medicine

## 2015-12-25 VITALS — BP 138/80 | HR 55 | Temp 98.5°F | Resp 20 | Wt 190.0 lb

## 2015-12-25 DIAGNOSIS — Z0001 Encounter for general adult medical examination with abnormal findings: Secondary | ICD-10-CM

## 2015-12-25 DIAGNOSIS — N529 Male erectile dysfunction, unspecified: Secondary | ICD-10-CM | POA: Diagnosis not present

## 2015-12-25 DIAGNOSIS — Z23 Encounter for immunization: Secondary | ICD-10-CM | POA: Diagnosis not present

## 2015-12-25 DIAGNOSIS — E785 Hyperlipidemia, unspecified: Secondary | ICD-10-CM

## 2015-12-25 LAB — BASIC METABOLIC PANEL
BUN: 14 mg/dL (ref 6–23)
CO2: 30 mEq/L (ref 19–32)
Calcium: 9 mg/dL (ref 8.4–10.5)
Chloride: 101 mEq/L (ref 96–112)
Creatinine, Ser: 1.07 mg/dL (ref 0.40–1.50)
GFR: 71.57 mL/min (ref >60.00)
GLUCOSE: 95 mg/dL (ref 70–99)
POTASSIUM: 4.1 meq/L (ref 3.5–5.1)
Sodium: 136 mEq/L (ref 135–145)

## 2015-12-25 LAB — LIPID PANEL
CHOLESTEROL: 220 mg/dL — AB (ref 0–200)
HDL: 43.9 mg/dL (ref >39.00)
LDL CALC: 150 mg/dL — AB (ref 0–99)
NonHDL: 176.4
TRIGLYCERIDES: 131 mg/dL (ref 0.0–149.0)
Total CHOL/HDL Ratio: 5
VLDL: 26.2 mg/dL (ref 0.0–40.0)

## 2015-12-25 LAB — URINALYSIS, ROUTINE W REFLEX MICROSCOPIC
BILIRUBIN URINE: NEGATIVE
HGB URINE DIPSTICK: NEGATIVE
Ketones, ur: NEGATIVE
LEUKOCYTES UA: NEGATIVE
NITRITE: NEGATIVE
RBC / HPF: NONE SEEN (ref 0–?)
Specific Gravity, Urine: <=1.005 — AB (ref 1.000–1.030)
TOTAL PROTEIN, URINE-UPE24: NEGATIVE
URINE GLUCOSE: NEGATIVE
UROBILINOGEN UA: 0.2 (ref 0.0–1.0)
WBC, UA: NONE SEEN (ref 0–?)
pH: 6 (ref 5.0–8.0)

## 2015-12-25 LAB — CBC WITH DIFFERENTIAL/PLATELET
BASOS ABS: 0 10*3/uL (ref 0.0–0.1)
Basophils Relative: 0.2 % (ref 0.0–3.0)
EOS ABS: 0.2 10*3/uL (ref 0.0–0.7)
Eosinophils Relative: 2.3 % (ref 0.0–5.0)
HCT: 46.7 % (ref 39.0–52.0)
HEMOGLOBIN: 16.1 g/dL (ref 13.0–17.0)
Lymphocytes Relative: 14.6 % (ref 12.0–46.0)
Lymphs Abs: 1.2 10*3/uL (ref 0.7–4.0)
MCHC: 34.5 g/dL (ref 30.0–36.0)
MCV: 97 fl (ref 78.0–100.0)
MONO ABS: 0.8 10*3/uL (ref 0.1–1.0)
Monocytes Relative: 10.2 % (ref 3.0–12.0)
Neutro Abs: 5.9 10*3/uL (ref 1.4–7.7)
Neutrophils Relative %: 72.7 % (ref 43.0–77.0)
Platelets: 234 10*3/uL (ref 150.0–400.0)
RBC: 4.81 Mil/uL (ref 4.22–5.81)
RDW: 12.4 % (ref 11.5–15.5)
WBC: 8.1 10*3/uL (ref 4.0–10.5)

## 2015-12-25 LAB — TSH: TSH: 2.39 u[IU]/mL (ref 0.35–4.50)

## 2015-12-25 LAB — PSA: PSA: 10.68 ng/mL — ABNORMAL HIGH (ref 0.10–4.00)

## 2015-12-25 LAB — HEPATIC FUNCTION PANEL
ALBUMIN: 4.3 g/dL (ref 3.5–5.2)
ALT: 13 U/L (ref 0–53)
AST: 18 U/L (ref 0–37)
Alkaline Phosphatase: 59 U/L (ref 39–117)
Bilirubin, Direct: 0.1 mg/dL (ref 0.0–0.3)
Total Bilirubin: 0.7 mg/dL (ref 0.2–1.2)
Total Protein: 7 g/dL (ref 6.0–8.3)

## 2015-12-25 NOTE — Patient Instructions (Addendum)
You had the flu shot today  Please return for Nurse Visit in 2 weeks for the Prevnar 13  Please continue all other medications as before, and refills have been done if requested.  Please have the pharmacy call with any other refills you may need.  Please continue your efforts at being more active, low cholesterol diet, and weight control.  You are otherwise up to date with prevention measures today.  Please keep your appointments with your specialists as you may have planned  Please go to the LAB in the Basement (turn left off the elevator) for the tests to be done today  You will be contacted by phone if any changes need to be made immediately.  Otherwise, you will receive a letter about your results with an explanation, but please check with MyChart first.  If you have Medicare related insurance (such as traditoinal Medicare, [employer] or [employer], or similar), Please make an appointment at the Scheduling desk with Maudie Mercury, the [employer], for your Wellness Visit in this office, which is a benefit with your insurance.  Please return in 1 year for your yearly visit, or sooner if needed, with Lab testing done 3-5 days before

## 2015-12-25 NOTE — Progress Notes (Signed)
Subjective:    Patient ID: [name] [name], male    DOB: January 07, 1941, 75 y.o.   MRN: [id number]  HPI  Here for wellness and f/u;  Overall doing ok;  Pt denies Chest pain, worsening SOB, DOE, wheezing, orthopnea, PND, worsening LE edema, palpitations, dizziness or syncope.  Pt denies neurological change such as new headache, facial or extremity weakness.  Pt denies polydipsia, polyuria, or low sugar symptoms. Pt states overall good compliance with treatment and medications, good tolerability, and has been trying to follow appropriate diet.  Pt denies worsening depressive symptoms, suicidal ideation or panic. No fever, night sweats, wt loss, loss of appetite, or other constitutional symptoms.  Pt states good ability with ADL's, has low fall risk, home safety reviewed and adequate, no other significant changes in hearing or vision, and trying to occasionally active with exercise. Not taking the crestor, wants to try diet, wife is ill so he cooks for himself and plans to do better.  Asks foir viagra prn due to symptoms, though wife is not able most of the time due to illness. Wants to address with lipid clinic instead of today  Has been intolerant of niacin and simvastatin, does not want ti try another statin Past Medical History:  Diagnosis Date  . 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 Allergy   . BRADYCARDIA 09/20/2007  . CAD (coronary artery disease)   . CEREBROVASCULAR ACCIDENT, HX OF 09/20/2007  . COLONIC POLYPS, HX OF 12/18/2006  . DIVERTICULOSIS, COLON 09/20/2007  . HYPERLIPIDEMIA 12/18/2006  . PERIPHERAL VASCULAR DISEASE 09/20/2007  . TIA 12/03/2007  . Varicose veins    Past Surgical History:  Procedure Laterality Date  . COLONOSCOPY  06-05-2009   last colon 06-05-2009 w/ patterson-polyps  . CORONARY ARTERY BYPASS GRAFT  02/10/2012   Procedure: CORONARY ARTERY BYPASS GRAFTING (CABG);  Surgeon: Ivin Poot, MD;  Location: [place];  Service: Open Heart  Surgery;  Laterality: N/A;  CABG x three, using Bilateral mammary artery harvest and left arm radial harvest  . INTRAOPERATIVE TRANSESOPHAGEAL ECHOCARDIOGRAM  02/10/2012   Procedure: INTRAOPERATIVE TRANSESOPHAGEAL ECHOCARDIOGRAM;  Surgeon: Ivin Poot, MD;  Location: [place];  Service: Open Heart Surgery;  Laterality: N/A;  . LEFT HEART CATHETERIZATION WITH CORONARY ANGIOGRAM N/A 02/10/2012   Procedure: LEFT HEART CATHETERIZATION WITH CORONARY ANGIOGRAM;  Surgeon: Burnell Blanks, MD;  Location: [facility] CATH LAB;  Service: Cardiovascular;  Laterality: N/A;  . POLYPECTOMY     hx TA colon polyps  . RADIAL ARTERY HARVEST  02/10/2012   Procedure: RADIAL ARTERY HARVEST;  Surgeon: Ivin Poot, MD;  Location: [place];  Service: Open Heart Surgery;  Laterality: Left;    reports that he has never smoked. He has never used smokeless tobacco. He reports that he does not drink alcohol or use drugs. family history includes Cancer in his brother; Colon cancer (age of onset: 11) in his brother; Heart disease in his father. Allergies  Allergen Reactions  . Niacin     REACTION: hives  . Simvastatin Other (See Comments)    myalgias   Current Outpatient Prescriptions on File Prior to Visit  Medication Sig Dispense Refill  . aspirin 81 MG tablet Take 1 tablet (81 mg total) by mouth daily. 30 tablet 6  . BEE POLLEN PO Take by mouth 2 (two) times daily.    . Multiple Vitamin (MULTIVITAMIN) tablet Take 1 tablet by mouth daily. Centrum silver- takes 5 days a week    .  Red Yeast Rice Extract (RED YEAST RICE PO) Take by mouth.    . Saw Palmetto, Serenoa repens, (SAW PALMETTO PO) Take 1 tablet by mouth daily.    . vitamin C (ASCORBIC ACID) 500 MG tablet Take 500 mg by mouth daily. 5 days a week     No current facility-administered medications on file prior to visit.    Review of Systems Constitutional: Negative for increased diaphoresis, or other activity, appetite or siginficant weight change other than  noted HENT: Negative for worsening hearing loss, ear pain, facial swelling, mouth sores and neck stiffness.   Eyes: Negative for other worsening pain, redness or visual disturbance.  Respiratory: Negative for choking or stridor Cardiovascular: Negative for other chest pain and palpitations.  Gastrointestinal: Negative for worsening diarrhea, blood in stool, or abdominal distention Genitourinary: Negative for hematuria, flank pain or change in urine volume.  Musculoskeletal: Negative for myalgias or other joint complaints.  Skin: Negative for other color change and wound or drainage.  Neurological: Negative for syncope and numbness. other than noted Hematological: Negative for adenopathy. or other swelling Psychiatric/Behavioral: Negative for hallucinations, SI, self-injury, decreased concentration or other worsening agitation.  All other system neg per pt    Objective:   Physical Exam BP 138/80   Pulse (!) 55   Temp 98.5 F (36.9 C) (Oral)   Resp 20   Wt 190 lb (86.2 kg)   SpO2 98%   BMI 27.26 kg/m  VS noted,  Constitutional: Pt is oriented to person, place, and time. Appears well-developed and well-nourished, in no significant distress Head: Normocephalic and atraumatic  Eyes: Conjunctivae and EOM are normal. Pupils are equal, round, and reactive to light Right Ear: External ear normal.  Left Ear: External ear normal Nose: Nose normal.  Mouth/Throat: Oropharynx is clear and moist  Neck: Normal range of motion. Neck supple. No JVD present. No tracheal deviation present or significant neck LA or mass Cardiovascular: Normal rate, regular rhythm, normal heart sounds and intact distal pulses.   Pulmonary/Chest: Effort normal and breath sounds without rales or wheezing  Abdominal: Soft. Bowel sounds are normal. NT. No HSM  Musculoskeletal: Normal range of motion. Exhibits 1+ bilat chronic venous edema Lymphadenopathy: Has no cervical adenopathy.  Neurological: Pt is alert and  oriented to person, place, and time. Pt has normal reflexes. No cranial nerve deficit. Motor grossly intact Skin: Skin is warm and dry. No rash noted or new ulcers Psychiatric:  Has normal mood and affect. Behavior is normal.  No other new exam findings    Assessment & Plan:

## 2015-12-25 NOTE — Progress Notes (Signed)
Pre visit review using our clinic review tool, if applicable. No additional management support is needed unless otherwise documented below in the visit note. 

## 2015-12-26 DIAGNOSIS — N529 Male erectile dysfunction, unspecified: Secondary | ICD-10-CM | POA: Insufficient documentation

## 2015-12-26 NOTE — Assessment & Plan Note (Signed)
Ok for viagra prn,  to f/u any worsening symptoms or concerns  

## 2015-12-26 NOTE — Assessment & Plan Note (Signed)
Uncontrolled, intolerant of a statin and niacin, decliens further statin, may consider zetia, for lipid with labs

## 2015-12-26 NOTE — Assessment & Plan Note (Signed)

## 2016-01-04 ENCOUNTER — Other Ambulatory Visit: Payer: Medicare Other

## 2016-03-02 DIAGNOSIS — L570 Actinic keratosis: Secondary | ICD-10-CM | POA: Diagnosis not present

## 2016-03-02 DIAGNOSIS — D225 Melanocytic nevi of trunk: Secondary | ICD-10-CM | POA: Diagnosis not present

## 2016-03-02 DIAGNOSIS — X32XXXD Exposure to sunlight, subsequent encounter: Secondary | ICD-10-CM | POA: Diagnosis not present

## 2016-03-04 DIAGNOSIS — H2513 Age-related nuclear cataract, bilateral: Secondary | ICD-10-CM | POA: Diagnosis not present

## 2016-03-04 DIAGNOSIS — H2512 Age-related nuclear cataract, left eye: Secondary | ICD-10-CM | POA: Diagnosis not present

## 2016-04-12 DIAGNOSIS — H2512 Age-related nuclear cataract, left eye: Secondary | ICD-10-CM | POA: Diagnosis not present

## 2016-04-13 DIAGNOSIS — H2511 Age-related nuclear cataract, right eye: Secondary | ICD-10-CM | POA: Diagnosis not present

## 2016-04-18 ENCOUNTER — Encounter: Payer: Self-pay | Admitting: Cardiovascular Disease

## 2016-04-19 DIAGNOSIS — H2511 Age-related nuclear cataract, right eye: Secondary | ICD-10-CM | POA: Diagnosis not present

## 2016-05-02 ENCOUNTER — Encounter: Payer: Self-pay | Admitting: Cardiovascular Disease

## 2016-05-02 ENCOUNTER — Ambulatory Visit (INDEPENDENT_AMBULATORY_CARE_PROVIDER_SITE_OTHER): Payer: PPO | Admitting: Cardiovascular Disease

## 2016-05-02 VITALS — BP 152/82 | HR 53 | Ht 70.0 in | Wt 188.6 lb

## 2016-05-02 DIAGNOSIS — I2510 Atherosclerotic heart disease of native coronary artery without angina pectoris: Secondary | ICD-10-CM

## 2016-05-02 DIAGNOSIS — E7800 Pure hypercholesterolemia, unspecified: Secondary | ICD-10-CM | POA: Diagnosis not present

## 2016-05-02 NOTE — Progress Notes (Signed)
Chief Complaint  Patient presents with  . Follow-up     History of Present Illness: 76 yo male with history of CAD s/p 3V CABG 02/10/12 (LIMA to LAD, RIMA to RCA, free radial to OM), hyperlipidemia, prostate cancer, CVA here today for cardiac follow up. He presented with unstable angina in January 2014. Cardiac cath 02/10/12 with 99% ostial left main stenosis, 99% ostial RCA stenosis. 3V CABG on 02/10/12. He has done well following his CABG. He has not tolerated statins.   He is here today for follow up. The patient denies any chest pain, dyspnea, palpitations, orthopnea, PND, dizziness, near syncope or syncope. He has LE edema several days per week. 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   Primary Care Physician: Cathlean Cower, MD  Past Medical History:  Diagnosis Date  . 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 Allergy   . BRADYCARDIA 09/20/2007  . CAD (coronary artery disease)   . CEREBROVASCULAR ACCIDENT, HX OF 09/20/2007  . COLONIC POLYPS, HX OF 12/18/2006  . DIVERTICULOSIS, COLON 09/20/2007  . HYPERLIPIDEMIA 12/18/2006  . PERIPHERAL VASCULAR DISEASE 09/20/2007  . TIA 12/03/2007  . Varicose veins     Past Surgical History:  Procedure Laterality Date  . COLONOSCOPY  06-05-2009   last colon 06-05-2009 w/ patterson-polyps  . CORONARY ARTERY BYPASS GRAFT  02/10/2012   Procedure: CORONARY ARTERY BYPASS GRAFTING (CABG);  Surgeon: Ivin Poot, MD;  Location: [place];  Service: Open Heart Surgery;  Laterality: N/A;  CABG x three, using Bilateral mammary artery harvest and left arm radial harvest  . INTRAOPERATIVE TRANSESOPHAGEAL ECHOCARDIOGRAM  02/10/2012   Procedure: INTRAOPERATIVE TRANSESOPHAGEAL ECHOCARDIOGRAM;  Surgeon: Ivin Poot, MD;  Location: [place];  Service: Open Heart Surgery;  Laterality: N/A;  . LEFT HEART CATHETERIZATION WITH CORONARY ANGIOGRAM N/A 02/10/2012   Procedure: LEFT HEART CATHETERIZATION WITH  CORONARY ANGIOGRAM;  Surgeon: Burnell Blanks, MD;  Location: [facility] CATH LAB;  Service: Cardiovascular;  Laterality: N/A;  . POLYPECTOMY     hx TA colon polyps  . RADIAL ARTERY HARVEST  02/10/2012   Procedure: RADIAL ARTERY HARVEST;  Surgeon: Ivin Poot, MD;  Location: [place];  Service: Open Heart Surgery;  Laterality: Left;    Current Outpatient Prescriptions  Medication Sig Dispense Refill  . aspirin 81 MG tablet Take 1 tablet (81 mg total) by mouth daily. 30 tablet 6  . BEE POLLEN PO Take by mouth 2 (two) times daily.    . Multiple Vitamin (MULTIVITAMIN) tablet Take 1 tablet by mouth daily. Centrum silver- takes 5 days a week    . Red Yeast Rice Extract (RED YEAST RICE PO) Take by mouth.    . Saw Palmetto, Serenoa repens, (SAW PALMETTO PO) Take 1 tablet by mouth daily.    . vitamin C (ASCORBIC ACID) 500 MG tablet Take 500 mg by mouth daily. 5 days a week     No current facility-administered medications for this visit.     Allergies  Allergen Reactions  . Niacin     REACTION: hives  . Simvastatin Other (See Comments)    myalgias    Social History   Social History  . Marital status: Married    Spouse name: N/A  . Number of children: N/A  . Years of education: N/A   Occupational History  . Not on file.   Social History Main Topics  . Smoking status: Never Smoker  .  Smokeless tobacco: Never Used  . Alcohol use No  . Drug use: No  . Sexual activity: Not on file   Other Topics Concern  . Not on file   Social History Narrative  . No narrative on file    Family History  Problem Relation Age of Onset  . Cancer Brother   . Colon cancer Brother 40    died age 88-found out 6 months before passing he had colon cancer  . Heart disease Father     Review of Systems:  As stated in the HPI and otherwise negative.   BP (!) 152/82   Pulse (!) 53   Ht 5\' 10"  (1.778 m)   Wt 188 lb 9.6 oz (85.5 kg)   SpO2 99%   BMI 27.06 kg/m   Physical Examination: General:  Well developed, well nourished, NAD  HEENT: OP clear, mucus membranes moist  SKIN: warm, dry. No rashes. Neuro: No focal deficits  Musculoskeletal: Muscle strength 5/5 all ext  Psychiatric: Mood and affect normal  Neck: No JVD, no carotid bruits, no thyromegaly, no lymphadenopathy.  Lungs:Clear bilaterally, no wheezes, rhonci, crackles Cardiovascular: Regular rate and rhythm. No murmurs, gallops or rubs. Abdomen:Soft. Bowel sounds present. Non-tender.  Extremities: No lower extremity edema. Pulses are 2 + in the bilateral DP/PT.  Cardiac cath 02/10/12:  Left main: Distal 99% stenosis.  Left Anterior Descending Artery: Large caliber vessel that courses to the apex. There is calcification in the proximal vessel. The proximal vessel has a 90% stenosis. The mid vessel has serial 50% stenoses. The distal vessel has mild plaque disease. The diagonal branch is small to moderate sized and has mild plaque disease.  Circumflex Artery: Large caliber, calcified vessel with 30% stenosis in the proximal vessel. The obtuse marginal is moderate in caliber and has 30% serial stenoses.  Right Coronary Artery: Large, dominant vessel with 99% ostial stenosis. The mid vessel has serial 30% stenoses with moderate calcification. The distal vessel has a 40-50% stenosis just before the bifurcation. The PDA and posterolateral branches are moderate in caliber with diffuse plaque but no focally obstructive lesions.  Left Ventricular Angiogram: LVEF=60-65%.   EKG:  EKG is ordered today. The ekg ordered today demonstrates Sinus brady, rate 53 bpm.   Recent Labs: 12/25/2015: ALT 13; BUN 14; Creatinine, Ser 1.07; Hemoglobin 16.1; Platelets 234.0; Potassium 4.1; Sodium 136; TSH 2.39   Lipid Panel    Component Value Date/Time   CHOL 220 (H) 12/25/2015 1109   TRIG 131.0 12/25/2015 1109   TRIG 61 12/16/2005 0753   HDL 43.90 12/25/2015 1109   CHOLHDL 5 12/25/2015 1109   VLDL 26.2 12/25/2015 1109   LDLCALC 150 (H)  12/25/2015 1109   LDLDIRECT 179.4 12/23/2010 0935     Wt Readings from Last 3 Encounters:  05/02/16 188 lb 9.6 oz (85.5 kg)  12/25/15 190 lb (86.2 kg)  04/22/15 191 lb (86.6 kg)     Other studies Reviewed: Additional studies/ records that were reviewed today include: . Review of the above records demonstrates:    Assessment and Plan:   1. CAD without angina: He is having no chest pain suggestive of angina. He is s/p CABG. Continue ASA. He has not tolerated statins. He did not tolerate beta blockers secondary to bradycardia. Will arrange echo to assess LV function  2. Hyperlipidemia: He has not tolerated statins. He recently tried low dose Crestor but he did not tolerate.     3. HTN: His BP has been less than 140  systolic at home.   4. Lower extremity edema: Mostly at the end of the day. Likely dependent edema. Resolves at night. Will arrange echo to assess LV function.   Current medicines are reviewed at length with the patient today.  The patient does not have concerns regarding medicines.  The following changes have been made:  no change  Labs/ tests ordered today include:   No orders of the defined types were placed in this encounter.    Disposition:   FU with me in 12  months   Signed, Lauree Chandler, MD 05/02/2016 3:55 PM    [facility] [address], [place], [place]  [address] Phone: [phone]; Fax: [phone]

## 2016-05-02 NOTE — Patient Instructions (Signed)

## 2016-05-16 ENCOUNTER — Other Ambulatory Visit: Payer: Self-pay

## 2016-05-16 ENCOUNTER — Ambulatory Visit (HOSPITAL_COMMUNITY): Payer: PPO | Attending: Cardiology

## 2016-05-16 DIAGNOSIS — I2510 Atherosclerotic heart disease of native coronary artery without angina pectoris: Secondary | ICD-10-CM | POA: Diagnosis not present

## 2016-05-16 DIAGNOSIS — Z951 Presence of aortocoronary bypass graft: Secondary | ICD-10-CM | POA: Diagnosis not present

## 2016-05-16 LAB — ECHOCARDIOGRAM COMPLETE
AVPHT: 945 ms
CHL CUP MV DEC (S): 162
CHL CUP RV SYS PRESS: 36 mmHg
EERAT: 6.97
EWDT: 162 ms
FS: 26 % — AB (ref 28–44)
IVS/LV PW RATIO, ED: 1.17
LA ID, A-P, ES: 36 mm
LA diam index: 1.76 cm/m2
LA vol A4C: 54.4 ml
LA vol: 69.6 mL
LAVOLIN: 34.1 mL/m2
LDCA: 3.14 cm2
LEFT ATRIUM END SYS DIAM: 36 mm
LV E/e' medial: 6.97
LV PW d: 12 mm — AB (ref 0.6–1.1)
LV TDI E'MEDIAL: 5.77
LVEEAVG: 6.97
LVELAT: 8.7 cm/s
LVOTD: 20 mm
Lateral S' vel: 6.2 cm/s
MV pk A vel: 52.9 m/s
MV pk E vel: 60.6 m/s
MVAP: 2.47 cm2
P 1/2 time: 47 ms
PV Reg vel dias: 96.4 cm/s
Reg peak vel: 288 cm/s
TDI e' lateral: 8.7
TR max vel: 288 cm/s

## 2016-08-16 ENCOUNTER — Ambulatory Visit: Payer: PPO | Admitting: Internal Medicine

## 2016-08-26 ENCOUNTER — Other Ambulatory Visit (INDEPENDENT_AMBULATORY_CARE_PROVIDER_SITE_OTHER): Payer: PPO

## 2016-08-26 ENCOUNTER — Encounter: Payer: Self-pay | Admitting: Internal Medicine

## 2016-08-26 ENCOUNTER — Ambulatory Visit (INDEPENDENT_AMBULATORY_CARE_PROVIDER_SITE_OTHER): Payer: PPO | Admitting: Internal Medicine

## 2016-08-26 VITALS — BP 124/76 | HR 61 | Ht 70.0 in | Wt 185.0 lb

## 2016-08-26 DIAGNOSIS — R5383 Other fatigue: Secondary | ICD-10-CM | POA: Diagnosis not present

## 2016-08-26 DIAGNOSIS — Z0000 Encounter for general adult medical examination without abnormal findings: Secondary | ICD-10-CM

## 2016-08-26 LAB — CBC WITH DIFFERENTIAL/PLATELET
Basophils Absolute: 0 10*3/uL (ref 0.0–0.1)
Basophils Relative: 0.3 % (ref 0.0–3.0)
EOS ABS: 0.1 10*3/uL (ref 0.0–0.7)
Eosinophils Relative: 2.1 % (ref 0.0–5.0)
HEMATOCRIT: 46.2 % (ref 39.0–52.0)
Hemoglobin: 15.7 g/dL (ref 13.0–17.0)
LYMPHS ABS: 0.9 10*3/uL (ref 0.7–4.0)
LYMPHS PCT: 15.1 % (ref 12.0–46.0)
MCHC: 33.9 g/dL (ref 30.0–36.0)
MCV: 97.7 fl (ref 78.0–100.0)
MONO ABS: 0.7 10*3/uL (ref 0.1–1.0)
Monocytes Relative: 11.8 % (ref 3.0–12.0)
NEUTROS ABS: 4.3 10*3/uL (ref 1.4–7.7)
NEUTROS PCT: 70.7 % (ref 43.0–77.0)
PLATELETS: 228 10*3/uL (ref 150.0–400.0)
RBC: 4.73 Mil/uL (ref 4.22–5.81)
RDW: 12.9 % (ref 11.5–15.5)
WBC: 6.1 10*3/uL (ref 4.0–10.5)

## 2016-08-26 LAB — BASIC METABOLIC PANEL
BUN: 8 mg/dL (ref 6–23)
CHLORIDE: 99 meq/L (ref 96–112)
CO2: 29 meq/L (ref 19–32)
CREATININE: 1.12 mg/dL (ref 0.40–1.50)
Calcium: 9.3 mg/dL (ref 8.4–10.5)
GFR: 67.78 mL/min (ref >60.00)
Glucose, Bld: 85 mg/dL (ref 70–99)
POTASSIUM: 4.3 meq/L (ref 3.5–5.1)
Sodium: 134 mEq/L — ABNORMAL LOW (ref 135–145)

## 2016-08-26 LAB — TSH: TSH: 2.38 u[IU]/mL (ref 0.35–4.50)

## 2016-08-26 LAB — HEPATIC FUNCTION PANEL
ALK PHOS: 63 U/L (ref 39–117)
ALT: 12 U/L (ref 0–53)
AST: 18 U/L (ref 0–37)
Albumin: 4.3 g/dL (ref 3.5–5.2)
BILIRUBIN DIRECT: 0.1 mg/dL (ref 0.0–0.3)
Total Bilirubin: 0.5 mg/dL (ref 0.2–1.2)
Total Protein: 7.2 g/dL (ref 6.0–8.3)

## 2016-08-26 NOTE — Progress Notes (Signed)
Subjective:    Patient ID: [name] [name], male    DOB: [birth year], 76 y.o.   MRN: [id number]  HPI  Here with c/o fatigue and tiring out more by the end of the day, overall 4-6 mo. Seems to be gradaullyt worse.  Usually has no problem in the morning with energy.   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  Pt denies fever, wt loss, night sweats, loss of appetite, or other constitutional symptoms  Thinks he gained wt, but chart shows some wt loss.   Wt Readings from Last 3 Encounters:  08/26/16 185 lb (83.9 kg)  05/02/16 188 lb 9.6 oz (85.5 kg)  12/25/15 190 lb (86.2 kg)  Has no significant pain.  Appetite is ok.    Just not as active overall as he has been in the past.   Does c/o ongoing fatigue, but denies signficant daytime hypersomnolence. Past Medical History:  Diagnosis Date  . 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 Allergy   . BRADYCARDIA 09/20/2007  . CAD (coronary artery disease)   . CEREBROVASCULAR ACCIDENT, HX OF 09/20/2007  . COLONIC POLYPS, HX OF 12/18/2006  . DIVERTICULOSIS, COLON 09/20/2007  . HYPERLIPIDEMIA 12/18/2006  . PERIPHERAL VASCULAR DISEASE 09/20/2007  . TIA 12/03/2007  . Varicose veins    Past Surgical History:  Procedure Laterality Date  . COLONOSCOPY  06-05-2009   last colon 06-05-2009 w/ patterson-polyps  . CORONARY ARTERY BYPASS GRAFT  02/10/2012   Procedure: CORONARY ARTERY BYPASS GRAFTING (CABG);  Surgeon: Ivin Poot, MD;  Location: [place];  Service: Open Heart Surgery;  Laterality: N/A;  CABG x three, using Bilateral mammary artery harvest and left arm radial harvest  . INTRAOPERATIVE TRANSESOPHAGEAL ECHOCARDIOGRAM  02/10/2012   Procedure: INTRAOPERATIVE TRANSESOPHAGEAL ECHOCARDIOGRAM;  Surgeon: Ivin Poot, MD;  Location: [place];  Service: Open Heart Surgery;  Laterality: N/A;  . LEFT HEART  CATHETERIZATION WITH CORONARY ANGIOGRAM N/A 02/10/2012   Procedure: LEFT HEART CATHETERIZATION WITH CORONARY ANGIOGRAM;  Surgeon: Burnell Blanks, MD;  Location: [facility] CATH LAB;  Service: Cardiovascular;  Laterality: N/A;  . POLYPECTOMY     hx TA colon polyps  . RADIAL ARTERY HARVEST  02/10/2012   Procedure: RADIAL ARTERY HARVEST;  Surgeon: Ivin Poot, MD;  Location: [place];  Service: Open Heart Surgery;  Laterality: Left;    reports that he has never smoked. He has never used smokeless tobacco. He reports that he does not drink alcohol or use drugs. family history includes Cancer in his brother; Colon cancer (age of onset: 58) in his brother; Heart disease in his father. Allergies  Allergen Reactions  . Niacin     REACTION: hives  . Simvastatin Other (See Comments)    myalgias   Current Outpatient Prescriptions on File Prior to Visit  Medication Sig Dispense Refill  . aspirin 81 MG tablet Take 1 tablet (81 mg total) by mouth daily. 30 tablet 6  . BEE POLLEN PO Take by mouth 2 (two) times daily.    . Multiple Vitamin (MULTIVITAMIN) tablet Take 1 tablet by mouth daily. Centrum silver- takes 5 days a week    . Red Yeast Rice Extract (RED YEAST RICE PO) Take by mouth.    . Saw Palmetto, Serenoa repens, (SAW PALMETTO PO) Take 1 tablet by mouth daily.    Marland Kitchen  vitamin C (ASCORBIC ACID) 500 MG tablet Take 500 mg by mouth daily. 5 days a week     No current facility-administered medications on file prior to visit.    Review of Systems All other system neg per pt    Objective:   Physical Exam BP 124/76   Pulse 61   Ht 5\' 10"  (1.778 m)   Wt 185 lb (83.9 kg)   SpO2 99%   BMI 26.54 kg/m  VS noted,  Constitutional: Pt appears in NAD HENT: Head: NCAT.  Right Ear: External ear normal.  Left Ear: External ear normal.  Eyes: . Pupils are equal, round, and reactive to light. Conjunctivae and EOM are normal Nose: without d/c or deformity Neck: Neck supple. Gross normal  ROM Cardiovascular: Normal rate and regular rhythm.   Pulmonary/Chest: Effort normal and breath sounds without rales or wheezing.  Abd:  Soft, NT, ND, + BS, no organomegaly Neurological: Pt is alert. At baseline orientation, motor grossly intact Skin: Skin is warm. No rashes, other new lesions, no LE edema Psychiatric: Pt behavior is normal without agitation  No other exam findings Lab Results  Component Value Date   WBC 6.1 08/26/2016   HGB 15.7 08/26/2016   HCT 46.2 08/26/2016   PLT 228.0 08/26/2016   GLUCOSE 85 08/26/2016   CHOL 220 (H) 12/25/2015   TRIG 131.0 12/25/2015   HDL 43.90 12/25/2015   LDLDIRECT 179.4 12/23/2010   LDLCALC 150 (H) 12/25/2015   ALT 12 08/26/2016   AST 18 08/26/2016   NA 134 (L) 08/26/2016   K 4.3 08/26/2016   CL 99 08/26/2016   CREATININE 1.12 08/26/2016   BUN 8 08/26/2016   CO2 29 08/26/2016   TSH 2.38 08/26/2016   PSA 10.68 (H) 12/25/2015   INR 1.40 02/11/2012      Assessment & Plan:

## 2016-08-26 NOTE — Patient Instructions (Signed)

## 2016-08-29 NOTE — Assessment & Plan Note (Signed)
Etiology unclear, suspect geriatric decline and recent lack of maintenance of activity, for labs as ordered,  to f/u any worsening symptoms or concerns

## 2016-09-12 DIAGNOSIS — N401 Enlarged prostate with lower urinary tract symptoms: Secondary | ICD-10-CM | POA: Diagnosis not present

## 2016-09-12 DIAGNOSIS — R3915 Urgency of urination: Secondary | ICD-10-CM | POA: Diagnosis not present

## 2016-09-12 DIAGNOSIS — C61 Malignant neoplasm of prostate: Secondary | ICD-10-CM | POA: Diagnosis not present

## 2016-09-16 DIAGNOSIS — L298 Other pruritus: Secondary | ICD-10-CM | POA: Diagnosis not present

## 2016-09-26 DIAGNOSIS — L821 Other seborrheic keratosis: Secondary | ICD-10-CM | POA: Diagnosis not present

## 2016-09-26 DIAGNOSIS — X32XXXD Exposure to sunlight, subsequent encounter: Secondary | ICD-10-CM | POA: Diagnosis not present

## 2016-09-26 DIAGNOSIS — L570 Actinic keratosis: Secondary | ICD-10-CM | POA: Diagnosis not present

## 2016-10-12 ENCOUNTER — Telehealth: Payer: Self-pay | Admitting: Vascular Surgery

## 2016-10-12 ENCOUNTER — Other Ambulatory Visit: Payer: Self-pay | Admitting: *Deleted

## 2016-10-12 ENCOUNTER — Telehealth: Payer: Self-pay | Admitting: *Deleted

## 2016-10-12 DIAGNOSIS — I83893 Varicose veins of bilateral lower extremities with other complications: Secondary | ICD-10-CM

## 2016-10-12 NOTE — Telephone Encounter (Signed)
Pt was seen only 2 yrs ago so they do not need to be a new pt. Sched appt 10/17/16; lab at 1:00 and MD at 2:00. Spoke to pt's wife.

## 2016-10-12 NOTE — Telephone Encounter (Signed)
Pt called in because he bled in the shower from a VV in his right ankle. He elevated his leg, applied pressure and wrapped it. He states he still has bulging vv's in both legs and wants Dr. Kellie Simmering to evealuate him. He states he wears his thigh high compression stockings daily (20-30 mm Hg) and elevates and takes Ibuprofen for discomfort. We will schedule a bilateral reflux study and JDL asap.

## 2016-10-12 NOTE — Telephone Encounter (Signed)
-----   Message from Rolla Flatten, RN sent at 10/12/2016 10:28 AM EDT ----- He needs a bilat reflux study and JDL visit as soon as we can. He bled from a vv last week. Call this a new vv since it's been a while since he was here. [phone]

## 2016-10-17 ENCOUNTER — Ambulatory Visit (HOSPITAL_COMMUNITY)
Admission: RE | Admit: 2016-10-17 | Discharge: 2016-10-17 | Disposition: A | Discharge status: Home / Self Care | Payer: PPO | Source: Ambulatory Visit | Attending: Vascular Surgery | Admitting: Vascular Surgery

## 2016-10-17 ENCOUNTER — Encounter: Payer: Self-pay | Admitting: Vascular Surgery

## 2016-10-17 ENCOUNTER — Ambulatory Visit (INDEPENDENT_AMBULATORY_CARE_PROVIDER_SITE_OTHER): Payer: PPO | Admitting: Vascular Surgery

## 2016-10-17 VITALS — BP 171/79 | HR 51 | Temp 97.7°F | Resp 18 | Ht 69.0 in | Wt 185.4 lb

## 2016-10-17 DIAGNOSIS — I83893 Varicose veins of bilateral lower extremities with other complications: Secondary | ICD-10-CM

## 2016-10-17 DIAGNOSIS — I83891 Varicose veins of right lower extremities with other complications: Secondary | ICD-10-CM

## 2016-10-17 NOTE — Progress Notes (Signed)
Subjective:     Patient ID: [name] [name], male   DOB: 04/06/1940, 76 y.o.   MRN: [id number]  HPI This 76 year old male is known to me having previously undergone laser ablation of the right great saphenous vein in 2012 and again in 2016. He has recanalized portions of his great saphenous vein which was documented 2 years ago and developed some recurrent varicosities. Last week he had an episode of extensive bleeding from the right ankle from a small varicose vein which required about 30 minutes to control. He has no history of DVT or thrombophlebitis in the right leg. He does were elastic compression stockings 20-30 millimeter gradient.  Past Medical History:  Diagnosis Date  . 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 Allergy   . BRADYCARDIA 09/20/2007  . CAD (coronary artery disease)   . CEREBROVASCULAR ACCIDENT, HX OF 09/20/2007  . COLONIC POLYPS, HX OF 12/18/2006  . DIVERTICULOSIS, COLON 09/20/2007  . HYPERLIPIDEMIA 12/18/2006  . PERIPHERAL VASCULAR DISEASE 09/20/2007  . TIA 12/03/2007  . Varicose veins     Social History  Substance Use Topics  . Smoking status: Never Smoker  . Smokeless tobacco: Never Used  . Alcohol use No    Family History  Problem Relation Age of Onset  . Cancer Brother   . Colon cancer Brother 85       died age 35-found out 6 months before passing he had colon cancer  . Heart disease Father     Allergies  Allergen Reactions  . Niacin     REACTION: hives  . Simvastatin Other (See Comments)    myalgias     Current Outpatient Prescriptions:  .  aspirin 81 MG tablet, Take 1 tablet (81 mg total) by mouth daily., Disp: 30 tablet, Rfl: 6 .  BEE POLLEN PO, Take by mouth 2 (two) times daily., Disp: , Rfl:  .  Multiple Vitamin (MULTIVITAMIN) tablet, Take 1 tablet by mouth daily. Centrum silver- takes 5 days a week, Disp: , Rfl:  .  Red Yeast Rice Extract (RED YEAST RICE PO), Take by mouth., Disp: , Rfl:  .  Saw  Palmetto, Serenoa repens, (SAW PALMETTO PO), Take 1 tablet by mouth daily., Disp: , Rfl:  .  vitamin C (ASCORBIC ACID) 500 MG tablet, Take 500 mg by mouth daily. 5 days a week, Disp: , Rfl:   Vitals:   10/17/16 1430 10/17/16 1435  BP: (!) 176/76 (!) 171/79  Pulse: (!) 51   Resp: 18   Temp: 97.7 F (36.5 C)   TempSrc: Oral   SpO2: 100%   Weight: 185 lb 6.4 oz (84.1 kg)   Height: 5\' 9"  (1.753 m)     Body mass index is 27.38 kg/m.         Review of Systems Denies chest pain, dyspnea on exertion, PND, orthopnea, hemoptysis    Objective:   Physical Exam BP (!) 171/79 (BP Location: Right Arm, Patient Position: Sitting, Cuff Size: Normal)   Pulse (!) 51   Temp 97.7 F (36.5 C) (Oral)   Resp 18   Ht 5\' 9"  (1.753 m)   Wt 185 lb 6.4 oz (84.1 kg)   SpO2 100%   BMI 27.38 kg/m   Gen. well-developed well-nourished male no apparent distress alert and oriented 3 Right leg with bulging varicosities beginning in the mid thigh extending anteriorly and into the medial knee area. Extensive network of reticular and spider veins lower third right leg  particular in the medial malleolar area posteriorly where the bleeding occurred. No ulceration noted in this area. Early hyperpigmentation left.  Today I ordered a venous duplex exam the right leg which I reviewed and interpreted. The right great saphenous vein is open proximally for about 5 cm up to the junction and then is occluded but then recanalizes in the mid thigh down to the knee level. These areas do have reflux. There is no DVT.     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 Bleeding was quite extensive when this occurred and patient is very worried about this We  will proceed with precertification to perform sclerotherapy for the bleeding site right ankle

## 2016-11-10 ENCOUNTER — Ambulatory Visit (INDEPENDENT_AMBULATORY_CARE_PROVIDER_SITE_OTHER): Payer: PPO | Admitting: *Deleted

## 2016-11-10 ENCOUNTER — Encounter: Payer: Self-pay | Admitting: *Deleted

## 2016-11-10 DIAGNOSIS — I868 Varicose veins of other specified sites: Secondary | ICD-10-CM

## 2016-11-10 DIAGNOSIS — I83891 Varicose veins of right lower extremities with other complications: Secondary | ICD-10-CM | POA: Diagnosis not present

## 2016-11-10 NOTE — Progress Notes (Signed)
X=.3% Sotradecol administered with a 27g butterfly.  Patient received a total of 12cc.  Treated area that bled and other potential areas. Easy access. Tol well. He wants to see TFE about his large varicose veins.     Compression stockings applied: Yes.

## 2016-11-17 ENCOUNTER — Encounter: Payer: Self-pay | Admitting: Vascular Surgery

## 2016-11-17 ENCOUNTER — Ambulatory Visit (INDEPENDENT_AMBULATORY_CARE_PROVIDER_SITE_OTHER): Payer: PPO | Admitting: Vascular Surgery

## 2016-11-17 VITALS — BP 177/79 | HR 50 | Temp 97.2°F | Resp 20 | Ht 69.0 in | Wt 188.1 lb

## 2016-11-17 DIAGNOSIS — I83891 Varicose veins of right lower extremities with other complications: Secondary | ICD-10-CM

## 2016-11-17 NOTE — Progress Notes (Signed)
[facility] [place]  Patient name: [name] [name] MRN: [id number] DOB: 1940/08/11 Sex: male  REASON FOR VISIT: Continued evaluation of right leg venous hypertension  HPI: [name] [name] is a 76 y.o. male here today for continued evaluation.  He is had prior laser ablation of his great saphenous vein with Dr. Kellie Simmering.  He had an episode of bleeding from his small raised telangiectasia at his ankle and had a sclerotherapy treatment of these.  He continues to have discomfort and swelling in his distal ankle region and also has continued to have changes of hemosiderin deposits around his ankle as well.  He did have a recent office visit and a duplex.  The duplex did show reflux in his great saphenous vein.  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  Past Medical History:  Diagnosis Date  . 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 Allergy   . BRADYCARDIA 09/20/2007  . CAD (coronary artery disease)   . CEREBROVASCULAR ACCIDENT, HX OF 09/20/2007  . COLONIC POLYPS, HX OF 12/18/2006  . DIVERTICULOSIS, COLON 09/20/2007  . HYPERLIPIDEMIA 12/18/2006  . PERIPHERAL VASCULAR DISEASE 09/20/2007  . TIA 12/03/2007  . Varicose veins     Family History  Problem Relation Age of Onset  . Cancer Brother   . Colon cancer Brother 52       died age 20-found out 6 months before passing he had colon cancer  . Heart disease Father     SOCIAL HISTORY: Social History  Substance Use Topics  . Smoking status: Never Smoker  . Smokeless tobacco: Never Used  . Alcohol use No    Allergies  Allergen Reactions  . Niacin     REACTION: hives  . Simvastatin Other (See Comments)    myalgias    Current Outpatient Prescriptions  Medication Sig Dispense Refill  . aspirin 81 MG tablet Take 1 tablet (81 mg  total) by mouth daily. 30 tablet 6  . BEE POLLEN PO Take by mouth 2 (two) times daily.    . Multiple Vitamin (MULTIVITAMIN) tablet Take 1 tablet by mouth daily. Centrum silver- takes 5 days a week    . Red Yeast Rice Extract (RED YEAST RICE PO) Take by mouth.    . Saw Palmetto, Serenoa repens, (SAW PALMETTO PO) Take 1 tablet by mouth daily.    . vitamin C (ASCORBIC ACID) 500 MG tablet Take 500 mg by mouth daily. 5 days a week     No current facility-administered medications for this visit.     REVIEW OF SYSTEMS:  [X]  denotes positive finding, [ ]  denotes negative finding Cardiac  Comments:  Chest pain or chest pressure:    Shortness of breath upon exertion:    Short of breath when lying flat:    Irregular heart rhythm:        Vascular    Pain in calf, thigh, or hip brought on by ambulation:    Pain in feet at night that wakes you up from your sleep:     Blood clot in your veins:    Leg swelling:           PHYSICAL EXAM: Vitals:   11/17/16 1136 11/17/16 1139  BP: (!) 176/78 (!) 177/79  Pulse: (!) 50   Resp: 20   Temp: (!) 97.2 F (36.2 C)  TempSrc: Oral   SpO2: 100%   Weight: 188 lb 1.6 oz (85.3 kg)   Height: 5\' 9"  (1.753 m)     GENERAL: The patient is a well-nourished male, in no acute distress. The vital signs are documented above. CARDIOVASCULAR: 2+ dorsalis pedis pulse bilaterally  PULMONARY: There is good air exchange  MUSCULOSKELETAL: There are no major deformities or cyanosis. NEUROLOGIC: No focal weakness or paresthesias are detected. SKIN: Since the changes bilaterally with hemosiderin deposits and raised telangiectasia bilaterally.  Closure of multiple of these telangiectasia due to recent sclerotherapy treatment PSYCHIATRIC: The patient has a normal affect.  DATA:  Reviewed his right leg formal venous duplex and also reimaged his right leg with SonoSite ultrasound.  He does have an enlarged 6 mm anterior accessory great saphenous vein arising from the  saphenofemoral junction and extending across his anterior thigh directly into this large nest of varicosities that extend down into the entire anterior thigh and anterior knee region.  MEDICAL ISSUES: Discussed this at length with the patient.  I would recommend ablation of his anterior accessory great saphenous vein.  I feel that this is causing a great deal of the venous hypertension that has led him to bleed.  Explained that we would initially do this and then see him feel that in all likelihood he will require phlebectomy of these multiple varicosities as well.  He understands and wishes to proceed as soon as possible    Rosetta Posner, MD FACS [facility] Tel [phone] Pager [phone]

## 2016-11-28 ENCOUNTER — Telehealth: Payer: Self-pay | Admitting: *Deleted

## 2016-11-28 NOTE — Telephone Encounter (Signed)
[name] [name] is established patient that was last seen by Dr. Donnetta Hutching on 11-17-2016 (has history of bleeding varicosities) and had sclerotherapy by Thea Silversmith on 11-10-2016.  [name] states he has 2 areas on his right and left feet (injection sites) that "are about the size of a wart and are dark".  [name] is still wearing compression hose. Advised him to cover the areas he described with band aides when wearing compression hose. Advised him that dark areas around injection sites were probably blood trapped underneath the skin that would reabsorb and resolve over time.  Will have Thea Silversmith follow up withy him.

## 2016-12-13 ENCOUNTER — Other Ambulatory Visit: Payer: Self-pay | Admitting: *Deleted

## 2016-12-13 ENCOUNTER — Other Ambulatory Visit: Payer: Self-pay | Admitting: Vascular Surgery

## 2016-12-13 DIAGNOSIS — I83811 Varicose veins of right lower extremities with pain: Secondary | ICD-10-CM

## 2017-01-26 ENCOUNTER — Encounter: Payer: Self-pay | Admitting: Vascular Surgery

## 2017-01-26 ENCOUNTER — Ambulatory Visit: Payer: PPO | Admitting: Vascular Surgery

## 2017-01-26 VITALS — BP 167/81 | HR 59 | Temp 98.0°F | Resp 16 | Ht 70.0 in | Wt 180.0 lb

## 2017-01-26 DIAGNOSIS — I83891 Varicose veins of right lower extremities with other complications: Secondary | ICD-10-CM

## 2017-01-26 NOTE — Progress Notes (Signed)
     Laser Ablation Procedure    Date: 01/26/2017   [name] [name] DOB:03/08/1940  Consent signed: Yes    Surgeon:  Dr. Sherren Mocha Thedford Bunton  Procedure: Laser Ablation: right Greater Ant Access. Branch Saphenous Vein  BP (!) 167/81   Pulse (!) 59   Temp 98 F (36.7 C)   Resp 16   Ht 5\' 10"  (1.778 m)   Wt 180 lb (81.6 kg)   SpO2 99%   BMI 25.83 kg/m   Tumescent Anesthesia: 120 cc 0.9% NaCl with 50 cc Lidocaine HCL with 1% Epi and 15 cc 8.4% NaHCO3  Local Anesthesia: 2 cc Lidocaine HCL and NaHCO3 (ratio 2:1)  15 watts continuous mode        Total energy: 807   Total time: :53    Patient tolerated procedure well  Notes:   Description of Procedure:  After marking the course of the secondary varicosities, the patient was placed on the operating table in the supine position, and the right leg was prepped and draped in sterile fashion.   Local anesthetic was administered and under ultrasound guidance the saphenous vein was accessed with a micro needle and guide wire; then the mirco puncture sheath was placed.  A guide wire was inserted saphenofemoral junction , followed by a 5 french sheath.  The position of the sheath and then the laser fiber below the junction was confirmed using the ultrasound.  Tumescent anesthesia was administered along the course of the saphenous vein using ultrasound guidance. The patient was placed in Trendelenburg position and protective laser glasses were placed on patient and staff, and the laser was fired at 15 watts continuous mode advancing 1-68mm/second for a total of 807 joules.     Steri strips were applied to the stab wounds and ABD pads and thigh high compression stockings were applied.  Ace wrap bandages were applied over the phlebectomy sites and at the top of the saphenofemoral junction. Blood loss was less than 15 cc.  The patient ambulated out of the operating room having tolerated the procedure well.  Uneventful ablation from mid thigh to just below  saphenofemoral junction.  Follow-up in 1 week

## 2017-02-02 ENCOUNTER — Encounter: Payer: Self-pay | Admitting: Vascular Surgery

## 2017-02-02 ENCOUNTER — Ambulatory Visit (HOSPITAL_COMMUNITY)
Admission: RE | Admit: 2017-02-02 | Discharge: 2017-02-02 | Disposition: A | Discharge status: Home / Self Care | Payer: PPO | Source: Ambulatory Visit | Attending: Vascular Surgery | Admitting: Vascular Surgery

## 2017-02-02 ENCOUNTER — Ambulatory Visit: Payer: PPO | Admitting: Vascular Surgery

## 2017-02-02 VITALS — BP 202/83 | HR 50 | Temp 98.0°F | Resp 16 | Ht 70.0 in | Wt 180.0 lb

## 2017-02-02 DIAGNOSIS — Z9889 Other specified postprocedural states: Secondary | ICD-10-CM | POA: Insufficient documentation

## 2017-02-02 DIAGNOSIS — I83891 Varicose veins of right lower extremities with other complications: Secondary | ICD-10-CM | POA: Diagnosis not present

## 2017-02-02 DIAGNOSIS — I83811 Varicose veins of right lower extremities with pain: Secondary | ICD-10-CM | POA: Insufficient documentation

## 2017-02-02 NOTE — Progress Notes (Signed)
[facility] [place]  Patient name: [name] [name] MRN: [id number] DOB: 1940/02/02 Sex: male  REASON FOR VISIT: Here today for one-week follow-up of laser ablation great saphenous anterior branch vein  HPI: [name] [name] is a 77 y.o. male here for follow-up.  Had minimal discomfort associated with his procedure.  Mild bruising but this is resolving nicely.  He does feel that he is having decompression of the marked varicosities throughout his thigh and pretibial area  Past Medical History:  Diagnosis Date  . 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 Allergy   . BRADYCARDIA 09/20/2007  . CAD (coronary artery disease)   . CEREBROVASCULAR ACCIDENT, HX OF 09/20/2007  . COLONIC POLYPS, HX OF 12/18/2006  . DIVERTICULOSIS, COLON 09/20/2007  . HYPERLIPIDEMIA 12/18/2006  . PERIPHERAL VASCULAR DISEASE 09/20/2007  . TIA 12/03/2007  . Varicose veins     Family History  Problem Relation Age of Onset  . Cancer Brother   . Colon cancer Brother 74       died age 39-found out 6 months before passing he had colon cancer  . Heart disease Father     SOCIAL HISTORY: Social History   Tobacco Use  . Smoking status: Never Smoker  . Smokeless tobacco: Never Used  Substance Use Topics  . Alcohol use: No    Alcohol/week: 0.0 oz    Allergies  Allergen Reactions  . Niacin     REACTION: hives  . Simvastatin Other (See Comments)    myalgias    Current Outpatient Medications  Medication Sig Dispense Refill  . aspirin 81 MG tablet Take 1 tablet (81 mg total) by mouth daily. 30 tablet 6  . BEE POLLEN PO Take by mouth 2 (two) times daily.    . Multiple Vitamin (MULTIVITAMIN) tablet Take 1 tablet by mouth daily. Centrum silver- takes 5 days a week    . Red Yeast Rice Extract (RED YEAST RICE PO) Take by mouth.    . Saw Palmetto, Serenoa repens, (SAW PALMETTO PO) Take 1 tablet by mouth daily.    .  vitamin C (ASCORBIC ACID) 500 MG tablet Take 500 mg by mouth daily. 5 days a week     No current facility-administered medications for this visit.     REVIEW OF SYSTEMS:  [X]  denotes positive finding, [ ]  denotes negative finding Cardiac  Comments:  Chest pain or chest pressure:    Shortness of breath upon exertion:    Short of breath when lying flat:    Irregular heart rhythm:        Vascular    Pain in calf, thigh, or hip brought on by ambulation:    Pain in feet at night that wakes you up from your sleep:     Blood clot in your veins:    Leg swelling:  x         PHYSICAL EXAM: Vitals:   02/02/17 0949 02/02/17 0950  BP: (!) 197/84 (!) 202/83  Pulse: (!) 50   Resp: 16   Temp: 98 F (36.7 C)   SpO2: 99%   Weight: 180 lb (81.6 kg)   Height: 5\' 10"  (1.778 m)     GENERAL: The patient is a well-nourished male, in no acute distress. The vital signs are documented above. CARDIOVASCULAR: Affable dorsalis pedis pulse PULMONARY: There is good air exchange  MUSCULOSKELETAL: There are no major deformities or cyanosis. NEUROLOGIC: No focal weakness or paresthesias are  detected. SKIN: There are no ulcers or rashes noted. PSYCHIATRIC: The patient has a normal affect.  DATA:  Closure of his saphenous vein by duplex and no evidence of DVT.  MEDICAL ISSUES: Successful ablation of anterior branch of great saphenous vein.  We will see him again in 3 months for continued discussion.  He does have large varicosities that resulted from this.  He understands that he may require phlebectomy of these for symptom relief.  He will continue wearing compression garments.    Rosetta Posner, MD FACS [facility] Tel [phone] Pager [phone]

## 2017-02-28 ENCOUNTER — Ambulatory Visit: Payer: PPO | Admitting: Internal Medicine

## 2017-03-03 DIAGNOSIS — H179 Unspecified corneal scar and opacity: Secondary | ICD-10-CM | POA: Diagnosis not present

## 2017-03-03 DIAGNOSIS — H40013 Open angle with borderline findings, low risk, bilateral: Secondary | ICD-10-CM | POA: Diagnosis not present

## 2017-03-03 DIAGNOSIS — H01003 Unspecified blepharitis right eye, unspecified eyelid: Secondary | ICD-10-CM | POA: Diagnosis not present

## 2017-03-03 DIAGNOSIS — H04123 Dry eye syndrome of bilateral lacrimal glands: Secondary | ICD-10-CM | POA: Diagnosis not present

## 2017-03-21 ENCOUNTER — Encounter: Payer: Self-pay | Admitting: Internal Medicine

## 2017-03-21 ENCOUNTER — Other Ambulatory Visit (INDEPENDENT_AMBULATORY_CARE_PROVIDER_SITE_OTHER): Payer: PPO

## 2017-03-21 ENCOUNTER — Ambulatory Visit (INDEPENDENT_AMBULATORY_CARE_PROVIDER_SITE_OTHER): Payer: PPO | Admitting: Internal Medicine

## 2017-03-21 VITALS — BP 158/82 | HR 65 | Temp 97.6°F | Ht 70.0 in | Wt 189.0 lb

## 2017-03-21 DIAGNOSIS — Z0000 Encounter for general adult medical examination without abnormal findings: Secondary | ICD-10-CM

## 2017-03-21 DIAGNOSIS — Z23 Encounter for immunization: Secondary | ICD-10-CM | POA: Diagnosis not present

## 2017-03-21 LAB — LIPID PANEL
CHOLESTEROL: 207 mg/dL — AB (ref 0–200)
HDL: 44.4 mg/dL (ref >39.00)
LDL Cholesterol: 137 mg/dL — ABNORMAL HIGH (ref 0–99)
NONHDL: 162.44
Total CHOL/HDL Ratio: 5
Triglycerides: 126 mg/dL (ref 0.0–149.0)
VLDL: 25.2 mg/dL (ref 0.0–40.0)

## 2017-03-21 LAB — URINALYSIS, ROUTINE W REFLEX MICROSCOPIC
BILIRUBIN URINE: NEGATIVE
Hgb urine dipstick: NEGATIVE
KETONES UR: NEGATIVE
Leukocytes, UA: NEGATIVE
NITRITE: NEGATIVE
RBC / HPF: NONE SEEN (ref 0–?)
Total Protein, Urine: NEGATIVE
URINE GLUCOSE: NEGATIVE
UROBILINOGEN UA: 0.2 (ref 0.0–1.0)
WBC UA: NONE SEEN (ref 0–?)
pH: 6 (ref 5.0–8.0)

## 2017-03-21 LAB — CBC WITH DIFFERENTIAL/PLATELET
BASOS PCT: 0.3 % (ref 0.0–3.0)
Basophils Absolute: 0 10*3/uL (ref 0.0–0.1)
EOS ABS: 0.2 10*3/uL (ref 0.0–0.7)
EOS PCT: 2.7 % (ref 0.0–5.0)
HEMATOCRIT: 47.4 % (ref 39.0–52.0)
HEMOGLOBIN: 16.1 g/dL (ref 13.0–17.0)
LYMPHS PCT: 10.3 % — AB (ref 12.0–46.0)
Lymphs Abs: 1 10*3/uL (ref 0.7–4.0)
MCHC: 34 g/dL (ref 30.0–36.0)
MCV: 96.9 fl (ref 78.0–100.0)
MONO ABS: 0.9 10*3/uL (ref 0.1–1.0)
Monocytes Relative: 10.1 % (ref 3.0–12.0)
NEUTROS ABS: 7.1 10*3/uL (ref 1.4–7.7)
Neutrophils Relative %: 76.6 % (ref 43.0–77.0)
PLATELETS: 211 10*3/uL (ref 150.0–400.0)
RBC: 4.89 Mil/uL (ref 4.22–5.81)
RDW: 12.2 % (ref 11.5–15.5)
WBC: 9.3 10*3/uL (ref 4.0–10.5)

## 2017-03-21 LAB — HEPATIC FUNCTION PANEL
ALBUMIN: 4.1 g/dL (ref 3.5–5.2)
ALK PHOS: 60 U/L (ref 39–117)
ALT: 12 U/L (ref 0–53)
AST: 20 U/L (ref 0–37)
Bilirubin, Direct: 0.1 mg/dL (ref 0.0–0.3)
TOTAL PROTEIN: 7.1 g/dL (ref 6.0–8.3)
Total Bilirubin: 0.5 mg/dL (ref 0.2–1.2)

## 2017-03-21 LAB — BASIC METABOLIC PANEL
BUN: 14 mg/dL (ref 6–23)
CALCIUM: 9.2 mg/dL (ref 8.4–10.5)
CO2: 30 mEq/L (ref 19–32)
Chloride: 98 mEq/L (ref 96–112)
Creatinine, Ser: 1.06 mg/dL (ref 0.40–1.50)
GFR: 72.11 mL/min (ref >60.00)
GLUCOSE: 84 mg/dL (ref 70–99)
POTASSIUM: 4.2 meq/L (ref 3.5–5.1)
Sodium: 134 mEq/L — ABNORMAL LOW (ref 135–145)

## 2017-03-21 LAB — TSH: TSH: 2.93 u[IU]/mL (ref 0.35–4.50)

## 2017-03-21 LAB — PSA: PSA: 12.39 ng/mL — ABNORMAL HIGH (ref 0.10–4.00)

## 2017-03-21 NOTE — Progress Notes (Signed)
Subjective:    Patient ID: [name] [name], male    DOB: [birth year], 77 y.o.   MRN: [id number]  HPI  Here for wellness and f/u;  Overall doing ok;  Pt denies Chest pain, worsening SOB, DOE, wheezing, orthopnea, PND, worsening LE edema, palpitations, dizziness or syncope.  Pt denies neurological change such as new headache, facial or extremity weakness.  Pt denies polydipsia, polyuria, or low sugar symptoms. Pt states overall good compliance with treatment and medications, good tolerability, and has been trying to follow appropriate diet.  Pt denies worsening depressive symptoms, suicidal ideation or panic. No fever, night sweats, wt loss, loss of appetite, or other constitutional symptoms.  Pt states good ability with ADL's, has low fall risk, home safety reviewed and adequate, no other significant changes in hearing or vision, and occasionally active with exercise.  BP this am at Y with 127/85 after exercise, does not accept is BP may be elevated.  No other interval change or new complaint BP Readings from Last 3 Encounters:  03/21/17 (!) 158/82  02/02/17 (!) 202/83  01/26/17 (!) 167/81   Past Medical History:  Diagnosis Date  . 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 Allergy   . BRADYCARDIA 09/20/2007  . CAD (coronary artery disease)   . CEREBROVASCULAR ACCIDENT, HX OF 09/20/2007  . COLONIC POLYPS, HX OF 12/18/2006  . DIVERTICULOSIS, COLON 09/20/2007  . HYPERLIPIDEMIA 12/18/2006  . PERIPHERAL VASCULAR DISEASE 09/20/2007  . TIA 12/03/2007  . Varicose veins    Past Surgical History:  Procedure Laterality Date  . COLONOSCOPY  06-05-2009   last colon 06-05-2009 w/ patterson-polyps  . CORONARY ARTERY BYPASS GRAFT  02/10/2012   Procedure: CORONARY ARTERY BYPASS GRAFTING (CABG);  Surgeon: Ivin Poot, MD;  Location: [place];  Service: Open Heart Surgery;  Laterality: N/A;  CABG x three, using Bilateral mammary artery harvest and left arm radial harvest  .  INTRAOPERATIVE TRANSESOPHAGEAL ECHOCARDIOGRAM  02/10/2012   Procedure: INTRAOPERATIVE TRANSESOPHAGEAL ECHOCARDIOGRAM;  Surgeon: Ivin Poot, MD;  Location: [place];  Service: Open Heart Surgery;  Laterality: N/A;  . LEFT HEART CATHETERIZATION WITH CORONARY ANGIOGRAM N/A 02/10/2012   Procedure: LEFT HEART CATHETERIZATION WITH CORONARY ANGIOGRAM;  Surgeon: Burnell Blanks, MD;  Location[facility] LAB;  Service: Cardiovascular;  Laterality: N/A;  . POLYPECTOMY     hx TA colon polyps  . RADIAL ARTERY HARVEST  02/10/2012   Procedure: RADIAL ARTERY HARVEST;  Surgeon: Ivin Poot, MD;  Location: [place];  Service: Open Heart Surgery;  Laterality: Left;    reports that  has never smoked. he has never used smokeless tobacco. He reports that he does not drink alcohol or use drugs. family history includes Cancer in his brother; Colon cancer (age of onset: 47) in his brother; Heart disease in his father. Allergies  Allergen Reactions  . Niacin     REACTION: hives  . Simvastatin Other (See Comments)    myalgias   Current Outpatient Medications on File Prior to Visit  Medication Sig Dispense Refill  . aspirin 81 MG tablet Take 1 tablet (81 mg total) by mouth daily. 30 tablet 6  . BEE POLLEN PO Take by mouth 2 (two) times daily.    . Multiple Vitamin (MULTIVITAMIN) tablet Take 1 tablet by mouth daily. Centrum silver- takes 5 days a week    . Red Yeast Rice Extract (RED YEAST RICE PO) Take by mouth.    Clarnce Flock Palmetto,  Serenoa repens, (SAW PALMETTO PO) Take 1 tablet by mouth daily.    . vitamin C (ASCORBIC ACID) 500 MG tablet Take 500 mg by mouth daily. 5 days a week     No current facility-administered medications on file prior to visit.    Review of Systems Constitutional: Negative for other unusual diaphoresis, sweats, appetite or weight changes HENT: Negative for other worsening hearing loss, ear pain, facial swelling, mouth sores or neck stiffness.   Eyes: Negative for other worsening  pain, redness or other visual disturbance.  Respiratory: Negative for other stridor or swelling Cardiovascular: Negative for other palpitations or other chest pain  Gastrointestinal: Negative for worsening diarrhea or loose stools, blood in stool, distention or other pain Genitourinary: Negative for hematuria, flank pain or other change in urine volume.  Musculoskeletal: Negative for myalgias or other joint swelling.  Skin: Negative for other color change, or other wound or worsening drainage.  Neurological: Negative for other syncope or numbness. Hematological: Negative for other adenopathy or swelling Psychiatric/Behavioral: Negative for hallucinations, other worsening agitation, SI, self-injury, or new decreased concentration All other system neg per pt    Objective:   Physical Exam BP (!) 158/82   Pulse 65   Temp 97.6 F (36.4 C) (Oral)   Ht 5\' 10"  (1.778 m)   Wt 189 lb (85.7 kg)   SpO2 99%   BMI 27.12 kg/m  VS noted,  Constitutional: Pt is oriented to person, place, and time. Appears well-developed and well-nourished, in no significant distress and comfortable Head: Normocephalic and atraumatic  Eyes: Conjunctivae and EOM are normal. Pupils are equal, round, and reactive to light Right Ear: External ear normal without discharge Left Ear: External ear normal without discharge Nose: Nose without discharge or deformity Mouth/Throat: Oropharynx is without other ulcerations and moist  Neck: Normal range of motion. Neck supple. No JVD present. No tracheal deviation present or significant neck LA or mass Cardiovascular: Normal rate, regular rhythm, normal heart sounds and intact distal pulses.   Pulmonary/Chest: WOB normal and breath sounds without rales or wheezing  Abdominal: Soft. Bowel sounds are normal. NT. No HSM  Musculoskeletal: Normal range of motion. Exhibits no edema Lymphadenopathy: Has no other cervical adenopathy.  Neurological: Pt is alert and oriented to person,  place, and time. Pt has normal reflexes. No cranial nerve deficit. Motor grossly intact, Gait intact Skin: Skin is warm and dry. No rash noted or new ulcerations Psychiatric:  Has normal mood and affect. Behavior is normal without agitation No other exam findings    Assessment & Plan:

## 2017-03-21 NOTE — Patient Instructions (Addendum)
You had the Prevnar 13 pneumonia shot  Please continue all other medications as before, and refills have been done if requested.  Please have the pharmacy call with any other refills you may need.  Please continue your efforts at being more active, low cholesterol diet, and weight control.  You are otherwise up to date with prevention measures today.  Please keep your appointments with your specialists as you may have planned  Please go to the LAB in the Basement (turn left off the elevator) for the tests to be done today  You will be contacted by phone if any changes need to be made immediately.  Otherwise, you will receive a letter about your results with an explanation, but please check with MyChart first.  Please remember to sign up for MyChart if you have not done so, as this will be important to you in the future with finding out test results, communicating by private email, and scheduling acute appointments online when needed.  Please return in 1 year for your yearly visit, or sooner if needed, with Lab testing done 3-5 days before

## 2017-03-24 NOTE — Assessment & Plan Note (Signed)

## 2017-05-08 ENCOUNTER — Ambulatory Visit: Payer: PPO | Admitting: Cardiovascular Disease

## 2017-05-09 ENCOUNTER — Ambulatory Visit: Payer: PPO | Admitting: Vascular Surgery

## 2017-05-09 ENCOUNTER — Other Ambulatory Visit: Payer: Self-pay

## 2017-05-09 ENCOUNTER — Encounter: Payer: Self-pay | Admitting: Vascular Surgery

## 2017-05-09 VITALS — BP 165/74 | HR 52 | Resp 20 | Ht 70.0 in | Wt 188.0 lb

## 2017-05-09 DIAGNOSIS — I83891 Varicose veins of right lower extremities with other complications: Secondary | ICD-10-CM

## 2017-05-09 NOTE — Progress Notes (Signed)
[facility] [place]  Patient name: [name] [name] MRN: [id number] DOB: 18-May-1940 Sex: male  REASON FOR VISIT: Low up right leg this pathology  HPI: [name] [name] is a 77 y.o. 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  He underwent uneventful ablation of his anterior branch great saphenous vein in January 2019.  He does not note any difference in the large varicosities throughout his medial right thigh extending down across his knee into his calf area.  He also has some prominent telangiectasia around his ankles and has had bleeding from these on one occasion.  Past Medical History:  Diagnosis Date  . 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 Allergy   . BRADYCARDIA 09/20/2007  . CAD (coronary artery disease)   . CEREBROVASCULAR ACCIDENT, HX OF 09/20/2007  . COLONIC POLYPS, HX OF 12/18/2006  . DIVERTICULOSIS, COLON 09/20/2007  . HYPERLIPIDEMIA 12/18/2006  . PERIPHERAL VASCULAR DISEASE 09/20/2007  . TIA 12/03/2007  . Varicose veins     Family History  Problem Relation Age of Onset  . Cancer Brother   . Colon cancer Brother 10       died age 20-found out 6 months before passing he had colon cancer  . Heart disease Father     SOCIAL HISTORY: Social History   Tobacco Use  . Smoking status: Never Smoker  . Smokeless tobacco: Never Used  Substance Use Topics  . Alcohol use: No    Alcohol/week: 0.0 oz    Allergies  Allergen Reactions  . Niacin     REACTION: hives  . Simvastatin Other (See Comments)    myalgias    Current Outpatient Medications  Medication Sig Dispense Refill  . aspirin 81 MG tablet Take 1 tablet (81 mg total) by mouth daily. 30 tablet 6  . BEE POLLEN PO Take by mouth 2 (two) times daily.    . Multiple Vitamin (MULTIVITAMIN) tablet Take 1 tablet by mouth daily. Centrum silver- takes 5 days a week    . Red Yeast Rice Extract  (RED YEAST RICE PO) Take by mouth.    . Saw Palmetto, Serenoa repens, (SAW PALMETTO PO) Take 1 tablet by mouth daily.    . vitamin C (ASCORBIC ACID) 500 MG tablet Take 500 mg by mouth daily. 5 days a week     No current facility-administered medications for this visit.     REVIEW OF SYSTEMS:  [X]  denotes positive finding, [ ]  denotes negative finding Cardiac  Comments:  Chest pain or chest pressure:    Shortness of breath upon exertion:    Short of breath when lying flat:    Irregular heart rhythm:        Vascular    Pain in calf, thigh, or hip brought on by ambulation:    Pain in feet at night that wakes you up from your sleep:     Blood clot in your veins:    Leg swelling:           PHYSICAL EXAM: Vitals:   05/09/17 0853 05/09/17 0855  BP: (!) 167/78 (!) 165/74  Pulse: (!) 52   Resp: 20   SpO2: 99%   Weight: 188 lb (85.3 kg)   Height: 5\' 10"  (1.778 m)     GENERAL: The patient is a well-nourished male, in no acute distress. The vital signs are documented above. CARDIOVASCULAR: Palpable dorsalis pedis pulses bilaterally.  Large varicosities  more so on the right leg than on the left PULMONARY: There is good air exchange  MUSCULOSKELETAL: There are no major deformities or cyanosis. NEUROLOGIC: No focal weakness or paresthesias are detected. SKIN: There are no ulcers or rashes noted. PSYCHIATRIC: The patient has a normal affect.  DATA:  None  MEDICAL ISSUES: Long discussion with patient regarding options.  He would be a good candidate for stab phlebectomy of these large tributary varicosities.  He reports that he is having no discomfort associated with them and is not concerned about the appearance.  I explained that it would be completely safe to not treat these varicosities.  I did explain what he should do if he had recurrent bleeding from the telangiectasia around his ankles.  Explained that this could be injected if he has evidence of recurrent bleeding.  He is  comfortable with continued observation only.  We will continue with knee-high compression and will see [place] again on an as-needed basis    Rosetta Posner, MD [facility] Tel [phone] Pager [phone]

## 2017-05-24 DIAGNOSIS — H26492 Other secondary cataract, left eye: Secondary | ICD-10-CM | POA: Diagnosis not present

## 2017-05-24 DIAGNOSIS — H01009 Unspecified blepharitis unspecified eye, unspecified eyelid: Secondary | ICD-10-CM | POA: Diagnosis not present

## 2017-05-24 DIAGNOSIS — H16223 Keratoconjunctivitis sicca, not specified as Sjogren's, bilateral: Secondary | ICD-10-CM | POA: Diagnosis not present

## 2017-05-24 DIAGNOSIS — H02833 Dermatochalasis of right eye, unspecified eyelid: Secondary | ICD-10-CM | POA: Diagnosis not present

## 2017-05-26 ENCOUNTER — Ambulatory Visit: Payer: PPO | Admitting: Cardiovascular Disease

## 2017-05-26 ENCOUNTER — Encounter: Payer: Self-pay | Admitting: Cardiovascular Disease

## 2017-05-26 VITALS — BP 154/70 | HR 49 | Ht 70.0 in | Wt 187.0 lb

## 2017-05-26 DIAGNOSIS — E7800 Pure hypercholesterolemia, unspecified: Secondary | ICD-10-CM

## 2017-05-26 DIAGNOSIS — H6123 Impacted cerumen, bilateral: Secondary | ICD-10-CM | POA: Diagnosis not present

## 2017-05-26 DIAGNOSIS — I10 Essential (primary) hypertension: Secondary | ICD-10-CM

## 2017-05-26 DIAGNOSIS — I2510 Atherosclerotic heart disease of native coronary artery without angina pectoris: Secondary | ICD-10-CM

## 2017-05-26 NOTE — Progress Notes (Signed)
Chief Complaint  Patient presents with  . Follow-up    CAD   History of Present Illness: 77 yo male with history of CAD s/p 3V CABG 02/10/12 (LIMA to LAD, RIMA to RCA, free radial to OM), hyperlipidemia, prostate cancer, CVA here today for cardiac follow up. He presented with unstable angina in January 2014. Cardiac cath 02/10/12 with 99% ostial left main stenosis, 99% ostial RCA stenosis. 3V CABG on 02/10/12. He has done well following his CABG. He has not tolerated statins. Echo April 2018 with normal LV function, mild AI, mild MR.   She is here today for follow up. The patient denies any chest pain, dyspnea, palpitations, lower extremity edema, orthopnea, PND, dizziness, near syncope or syncope.   Primary Care Physician: Biagio Borg, MD  Past Medical History:  Diagnosis Date  . 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 Allergy   . BRADYCARDIA 09/20/2007  . CAD (coronary artery disease)   . CEREBROVASCULAR ACCIDENT, HX OF 09/20/2007  . COLONIC POLYPS, HX OF 12/18/2006  . DIVERTICULOSIS, COLON 09/20/2007  . HYPERLIPIDEMIA 12/18/2006  . PERIPHERAL VASCULAR DISEASE 09/20/2007  . TIA 12/03/2007  . Varicose veins     Past Surgical History:  Procedure Laterality Date  . COLONOSCOPY  06-05-2009   last colon 06-05-2009 w/ patterson-polyps  . CORONARY ARTERY BYPASS GRAFT  02/10/2012   Procedure: CORONARY ARTERY BYPASS GRAFTING (CABG);  Surgeon: Ivin Poot, MD;  Location: [place];  Service: Open Heart Surgery;  Laterality: N/A;  CABG x three, using Bilateral mammary artery harvest and left arm radial harvest  . INTRAOPERATIVE TRANSESOPHAGEAL ECHOCARDIOGRAM  02/10/2012   Procedure: INTRAOPERATIVE TRANSESOPHAGEAL ECHOCARDIOGRAM;  Surgeon: Ivin Poot, MD;  Location: [place];  Service: Open Heart Surgery;  Laterality: N/A;  . LEFT HEART CATHETERIZATION WITH CORONARY ANGIOGRAM N/A 02/10/2012   Procedure: LEFT HEART CATHETERIZATION WITH CORONARY ANGIOGRAM;   Surgeon: Burnell Blanks, MD;  Location: [facility] CATH LAB;  Service: Cardiovascular;  Laterality: N/A;  . POLYPECTOMY     hx TA colon polyps  . RADIAL ARTERY HARVEST  02/10/2012   Procedure: RADIAL ARTERY HARVEST;  Surgeon: Ivin Poot, MD;  Location: [place];  Service: Open Heart Surgery;  Laterality: Left;    Current Outpatient Medications  Medication Sig Dispense Refill  . aspirin 81 MG tablet Take 1 tablet (81 mg total) by mouth daily. 30 tablet 6  . BEE POLLEN PO Take by mouth 2 (two) times daily.    . Multiple Vitamin (MULTIVITAMIN) tablet Take 1 tablet by mouth daily. Centrum silver- takes 5 days a week    . Red Yeast Rice Extract (RED YEAST RICE PO) Take by mouth.    . Saw Palmetto, Serenoa repens, (SAW PALMETTO PO) Take 1 tablet by mouth daily.    . vitamin C (ASCORBIC ACID) 500 MG tablet Take 500 mg by mouth daily. 5 days a week     No current facility-administered medications for this visit.     Allergies  Allergen Reactions  . Niacin     REACTION: hives  . Simvastatin Other (See Comments)    myalgias    Social History   Socioeconomic History  . Marital status: Married    Spouse name: Not on file  . Number of children: Not on file  . Years of education: Not on file  . Highest education level: Not on file  Occupational History  . Not on file  Social Needs  .  Financial resource strain: Not on file  . Food insecurity:    Worry: Not on file    Inability: Not on file  . Transportation needs:    Medical: Not on file    Non-medical: Not on file  Tobacco Use  . Smoking status: Never Smoker  . Smokeless tobacco: Never Used  Substance and Sexual Activity  . Alcohol use: No    Alcohol/week: 0.0 oz  . Drug use: No  . Sexual activity: Not on file  Lifestyle  . Physical activity:    Days per week: Not on file    Minutes per session: Not on file  . Stress: Not on file  Relationships  . Social connections:    Talks on phone: Not on file    Gets together: Not  on file    Attends religious service: Not on file    Active member of club or organization: Not on file    Attends meetings of clubs or organizations: Not on file    Relationship status: Not on file  . Intimate partner violence:    Fear of current or ex partner: Not on file    Emotionally abused: Not on file    Physically abused: Not on file    Forced sexual activity: Not on file  Other Topics Concern  . Not on file  Social History Narrative  . Not on file    Family History  Problem Relation Age of Onset  . Cancer Brother   . Colon cancer Brother 28       died age 26-found out 6 months before passing he had colon cancer  . Heart disease Father     Review of Systems:  As stated in the HPI and otherwise negative.   BP (!) 154/70   Pulse (!) 49   Ht 5\' 10"  (1.778 m)   Wt 187 lb (84.8 kg)   SpO2 98%   BMI 26.83 kg/m   Physical Examination: General: Well developed, well nourished, NAD  HEENT: OP clear, mucus membranes moist  SKIN: warm, dry. No rashes. Neuro: No focal deficits  Musculoskeletal: Muscle strength 5/5 all ext  Psychiatric: Mood and affect normal  Neck: No JVD, no carotid bruits, no thyromegaly, no lymphadenopathy.  Lungs:Clear bilaterally, no wheezes, rhonci, crackles Cardiovascular: Regular rate and rhythm. No murmurs, gallops or rubs. Abdomen:Soft. Bowel sounds present. Non-tender.  Extremities: No lower extremity edema. Pulses are 2 + in the bilateral DP/PT.  Echo 05/16/16: - Left ventricle: The cavity size was normal. There was mild focal   basal hypertrophy of the septum. Systolic function was normal.   The estimated ejection fraction was in the range of 55% to 60%.   Wall motion was normal; there were no regional wall motion   abnormalities. Features are consistent with a pseudonormal left   ventricular filling pattern, with concomitant abnormal relaxation   and increased filling pressure (grade 2 diastolic dysfunction). - Aortic valve: There was  mild regurgitation. - Aortic root: The aortic root was mildly dilated. - Mitral valve: There was mild regurgitation. - Left atrium: The atrium was mildly dilated. - Pulmonary arteries: Systolic pressure was mildly increased. PA   peak pressure: 36 mm Hg (S).  Impressions:  - Normal LV systolic function; moderate diastolic dysfunction;   sclerotic aortic valve with mild AI; mildly dilated aortic root;   mild MR; mild LAE; mild TR with mildly elevated pulmonary   pressure.  Cardiac cath 02/10/12:  Left main: Distal 99% stenosis.  Left Anterior Descending Artery: Large caliber vessel that courses to the apex. There is calcification in the proximal vessel. The proximal vessel has a 90% stenosis. The mid vessel has serial 50% stenoses. The distal vessel has mild plaque disease. The diagonal branch is small to moderate sized and has mild plaque disease.  Circumflex Artery: Large caliber, calcified vessel with 30% stenosis in the proximal vessel. The obtuse marginal is moderate in caliber and has 30% serial stenoses.  Right Coronary Artery: Large, dominant vessel with 99% ostial stenosis. The mid vessel has serial 30% stenoses with moderate calcification. The distal vessel has a 40-50% stenosis just before the bifurcation. The PDA and posterolateral branches are moderate in caliber with diffuse plaque but no focally obstructive lesions.  Left Ventricular Angiogram: LVEF=60-65%.   EKG:  EKG is  ordered today. The ekg ordered today demonstrates sinus brady, rate 49 bpm  Recent Labs: 03/21/2017: ALT 12; BUN 14; Creatinine, Ser 1.06; Hemoglobin 16.1; Platelets 211.0; Potassium 4.2; Sodium 134; TSH 2.93   Lipid Panel    Component Value Date/Time   CHOL 207 (H) 03/21/2017 1037   TRIG 126.0 03/21/2017 1037   TRIG 61 12/16/2005 0753   HDL 44.40 03/21/2017 1037   CHOLHDL 5 03/21/2017 1037   VLDL 25.2 03/21/2017 1037   LDLCALC 137 (H) 03/21/2017 1037   LDLDIRECT 179.4 12/23/2010 0935     Wt  Readings from Last 3 Encounters:  05/26/17 187 lb (84.8 kg)  05/09/17 188 lb (85.3 kg)  03/21/17 189 lb (85.7 kg)     Other studies Reviewed: Additional studies/ records that were reviewed today include: . Review of the above records demonstrates:    Assessment and Plan:   1. CAD without angina: No chest pain. LV function normal by echo in 2018. He is on ASA but has not tolerated beta blockers due to bradycardia or statins due to muscle aches.   2. Hyperlipidemia: He does not tolerate statins. I have discussed Repatha/Praluent but he will consider.   3. HTN: BP is controlled at home. No changes.   4. Lower extremity edema: This is consistent with dependent edema  Current medicines are reviewed at length with the patient today.  The patient does not have concerns regarding medicines.  The following changes have been made:  no change  Labs/ tests ordered today include:   Orders Placed This Encounter  Procedures  . EKG 12-Lead     Disposition:   FU with me in 12  months   Signed, Lauree Chandler, MD 05/26/2017 3:19 PM    [facility] [place], [place], [place]  [address] Phone: [phone]; Fax: [phone]

## 2017-05-26 NOTE — Patient Instructions (Signed)

## 2017-06-16 DIAGNOSIS — H903 Sensorineural hearing loss, bilateral: Secondary | ICD-10-CM | POA: Diagnosis not present

## 2017-07-03 DIAGNOSIS — L570 Actinic keratosis: Secondary | ICD-10-CM | POA: Diagnosis not present

## 2017-07-03 DIAGNOSIS — X32XXXD Exposure to sunlight, subsequent encounter: Secondary | ICD-10-CM | POA: Diagnosis not present

## 2017-07-03 DIAGNOSIS — L820 Inflamed seborrheic keratosis: Secondary | ICD-10-CM | POA: Diagnosis not present

## 2017-07-04 DIAGNOSIS — H903 Sensorineural hearing loss, bilateral: Secondary | ICD-10-CM | POA: Diagnosis not present

## 2017-07-04 DIAGNOSIS — H9312 Tinnitus, left ear: Secondary | ICD-10-CM | POA: Diagnosis not present

## 2017-08-14 ENCOUNTER — Ambulatory Visit: Payer: PPO

## 2017-08-16 DIAGNOSIS — H6123 Impacted cerumen, bilateral: Secondary | ICD-10-CM | POA: Diagnosis not present

## 2017-08-16 DIAGNOSIS — H9113 Presbycusis, bilateral: Secondary | ICD-10-CM | POA: Diagnosis not present

## 2017-08-29 DIAGNOSIS — H903 Sensorineural hearing loss, bilateral: Secondary | ICD-10-CM | POA: Diagnosis not present

## 2017-08-29 DIAGNOSIS — H9113 Presbycusis, bilateral: Secondary | ICD-10-CM | POA: Diagnosis not present

## 2017-09-01 DIAGNOSIS — H02839 Dermatochalasis of unspecified eye, unspecified eyelid: Secondary | ICD-10-CM | POA: Diagnosis not present

## 2017-09-01 DIAGNOSIS — H04123 Dry eye syndrome of bilateral lacrimal glands: Secondary | ICD-10-CM | POA: Diagnosis not present

## 2017-09-07 ENCOUNTER — Ambulatory Visit (INDEPENDENT_AMBULATORY_CARE_PROVIDER_SITE_OTHER): Payer: PPO | Admitting: Vascular Surgery

## 2017-09-07 ENCOUNTER — Encounter: Payer: Self-pay | Admitting: Vascular Surgery

## 2017-09-07 VITALS — BP 150/76 | HR 57 | Temp 97.5°F | Resp 16 | Ht 70.0 in | Wt 180.0 lb

## 2017-09-07 DIAGNOSIS — I83811 Varicose veins of right lower extremities with pain: Secondary | ICD-10-CM

## 2017-09-07 NOTE — Progress Notes (Signed)
Patient name: [name] [name] MRN: [id number] DOB: 1940-11-20 Sex: male  REASON FOR VISIT:   Painful varicose veins right lower extremity.  HPI:   [name] [name] is a pleasant 77 y.o. male who was last seen by Dr. Sherren Mocha Early on 05/09/2017.  He underwent uneventful endovenous laser ablation of the anterior branch of the great saphenous vein in January of this year.  He has some prominent telangiectasias around his ankle and is been bleeding from these on one occasion.  He is back to discuss possible stab phlebectomies.  He continues to have aching pain and heaviness in both lower extremities which is aggravated by standing and relieved somewhat with elevation.  He has been wearing his stockings.  He is helped some.  He takes ibuprofen as needed.  Current Outpatient Medications  Medication Sig Dispense Refill  . aspirin 81 MG tablet Take 1 tablet (81 mg total) by mouth daily. 30 tablet 6  . BEE POLLEN PO Take by mouth 2 (two) times daily.    . Multiple Vitamin (MULTIVITAMIN) tablet Take 1 tablet by mouth daily. Centrum silver- takes 5 days a week    . Red Yeast Rice Extract (RED YEAST RICE PO) Take by mouth.    . Saw Palmetto, Serenoa repens, (SAW PALMETTO PO) Take 1 tablet by mouth daily.    . vitamin C (ASCORBIC ACID) 500 MG tablet Take 500 mg by mouth daily. 5 days a week     No current facility-administered medications for this visit.     REVIEW OF SYSTEMS:  [X]  denotes positive finding, [ ]  denotes negative finding Vascular    Leg swelling    Cardiac    Chest pain or chest pressure:    Shortness of breath upon exertion:    Short of breath when lying flat:    Irregular heart rhythm:    Constitutional    Fever or chills:     PHYSICAL EXAM:   Vitals:   09/07/17 1229  BP: (!) 150/76  Pulse: (!) 57  Resp: 16  Temp: (!) 97.5 F (36.4 C)  SpO2: 99%  Weight: 180 lb (81.6 kg)  Height: 5\' 10"  (1.778 m)    GENERAL: The patient is a well-nourished male, in no acute distress.  The vital signs are documented above. CARDIOVASCULAR: There is a regular rate and rhythm. PULMONARY: There is good air exchange bilaterally without wheezing or rales. VENOUS EXAM: Patient has mild bilateral lower extremity swelling.  He has significant dilated varicose veins in the right leg involving the thigh and anterior leg.  Has dilated varicose veins in the left leg involving the thigh and anterior leg.  He has some reticular veins and spider veins posteriorly.  He has some mild hyperpigmentation.  DATA:   No new data.  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  He has painful varicose veins bilateral and I think he is a good candidate for staged stab phlebectomies.  He would also require sclerotherapy.  He has had one previous bleeding episode from his telangiectasias in his right ankle which would be another reason to proceed with intervention.  Patient I have discussed with the patient the indications for stab phlebectomy.  I have explained to the patient that that will have small scars from the stab incisions.  I explained that the other risks include leg swelling, bruising, bleeding, and phlebitis.  All the patient's questions were encouraged and answered and they are agreeable to proceed.  Deitra Mayo [facility] [phone]

## 2017-09-14 ENCOUNTER — Ambulatory Visit: Payer: PPO | Admitting: Vascular Surgery

## 2017-09-14 ENCOUNTER — Encounter: Payer: Self-pay | Admitting: Vascular Surgery

## 2017-09-14 VITALS — BP 174/80 | HR 76 | Temp 98.6°F | Resp 16 | Ht 70.0 in | Wt 180.0 lb

## 2017-09-14 DIAGNOSIS — I868 Varicose veins of other specified sites: Secondary | ICD-10-CM

## 2017-09-14 DIAGNOSIS — I83811 Varicose veins of right lower extremities with pain: Secondary | ICD-10-CM

## 2017-09-14 NOTE — Progress Notes (Signed)
    Stab Phlebectomy Procedure  [name] [name] DOB:08/12/1940  09/14/2017  Consent signed: Yes  Surgeon:C. Scot Dock, MD  Procedure: stab phlebectomy: right leg  BP (!) 174/80   Pulse 76   Temp 98.6 F (37 C)   Resp 16   Ht 5\' 10"  (1.778 m)   Wt 180 lb (81.6 kg)   SpO2 100%   BMI 25.83 kg/m   Start time: 11am   End time: 1pm   Tumescent Anesthesia: 600 cc 0.9% NaCl with 50 cc Lidocaine HCL with 1% Epi and 15 cc 8.4% NaHCO3  Local Anesthesia: 6 cc Lidocaine HCL and NaHCO3 (ratio 2:1)    Stab Phlebectomy: >20 Sites: Thigh, Calf and Ankle  Patient tolerated procedure well: Yes  Notes:   Description of Procedure:  After marking the course of the secondary varicosities, the patient was placed on the operating table in the supine position, and the right leg was prepped and draped in sterile fashion.    The patient was then put into Trendelenburg position.  Local anesthetic was administered at the previously marked varicosities, and tumescent anesthesia was administered around the vessels.  Greater than 20 stab wounds were made using the tip of an 11 blade. And using the vein hook, the phlebectomies were performed using a hemostat to avulse the varicosities.  Adequate hemostasis was achieved, and steri strips were applied to the stab wound.    .  ABD pads and thigh high compression stockings were applied as well ace wraps where needed. Blood loss was less than 15 cc.  The patient ambulated out of the operating room having tolerated the procedure well.

## 2017-09-14 NOTE — Progress Notes (Signed)
   Patient name: [name] [name] MRN: [id number] DOB: 1940-11-30 Sex: male  REASON FOR VISIT:   Painful varicose veins right lower extremity.  HPI:   [name] [name] is a pleasant 77 y.o. male who underwent endovenous laser ablation of the anterior branch of the great saphenous vein by Dr. early in January of this year.  He has some telangiectasias around his ankle and bleeding on one occasion.  He presents for stab phlebectomy of multiple painful varicose veins of his right lower extremity.  Current Outpatient Medications  Medication Sig Dispense Refill  . aspirin 81 MG tablet Take 1 tablet (81 mg total) by mouth daily. 30 tablet 6  . BEE POLLEN PO Take by mouth 2 (two) times daily.    . Multiple Vitamin (MULTIVITAMIN) tablet Take 1 tablet by mouth daily. Centrum silver- takes 5 days a week    . Red Yeast Rice Extract (RED YEAST RICE PO) Take by mouth.    . Saw Palmetto, Serenoa repens, (SAW PALMETTO PO) Take 1 tablet by mouth daily.    . vitamin C (ASCORBIC ACID) 500 MG tablet Take 500 mg by mouth daily. 5 days a week     No current facility-administered medications for this visit.     REVIEW OF SYSTEMS:  [X]  denotes positive finding, [ ]  denotes negative finding Vascular    Leg swelling    Cardiac    Chest pain or chest pressure:    Shortness of breath upon exertion:    Short of breath when lying flat:    Irregular heart rhythm:    Constitutional    Fever or chills:     PHYSICAL EXAM:   Vitals:   09/14/17 1058  BP: (!) 174/80  Pulse: 76  Resp: 16  Temp: 98.6 F (37 C)  SpO2: 100%  Weight: 180 lb (81.6 kg)  Height: 5\' 10"  (1.778 m)    GENERAL: The patient is a well-nourished male, in no acute distress. The vital signs are documented above. CARDIOVASCULAR: There is a regular rate and rhythm. PULMONARY: There is good air exchange bilaterally without wheezing or rales. He has some large truncal varicose veins in his right thigh medial thigh distal thigh and right leg.   These are all under significant pressure.  He has significant telangiectasias around the ankle.  DATA:   No new data.  MEDICAL ISSUES:   GREATER THAN 20 STAB PHLEBECTOMIES: The patient standing the dilated varicose veins were marked.  The right leg was then prepped and draped in usual sterile fashion.  Tumescent anesthesia was infiltrated to anesthetize all the marked areas and then using multiple small stab incisions with an 11 blade the crochet hook was used to retract the main into the wound above the incision and the veins were gently removed using blunt dissection with a hemostat.  Rater than 20 stab phlebectomies were performed.  Sterile dressing was applied.  Patient tolerated procedure well.  He will return next week for staged stab phlebectomies in the left leg.  Deitra Mayo [facility] [phone]

## 2017-09-15 ENCOUNTER — Encounter: Payer: Self-pay | Admitting: Vascular Surgery

## 2017-09-21 ENCOUNTER — Encounter: Payer: Self-pay | Admitting: Vascular Surgery

## 2017-09-21 ENCOUNTER — Ambulatory Visit: Payer: PPO | Admitting: Vascular Surgery

## 2017-09-21 VITALS — BP 157/76 | HR 57 | Temp 97.6°F | Resp 16 | Ht 70.0 in | Wt 180.0 lb

## 2017-09-21 DIAGNOSIS — I83812 Varicose veins of left lower extremities with pain: Secondary | ICD-10-CM | POA: Diagnosis not present

## 2017-09-21 NOTE — Progress Notes (Signed)
    Stab Phlebectomy Procedure  [name] [name] DOB:[birth year]  09/21/2017  Consent signed: Yes  Surgeon:C. Scot Dock, MD  Procedure: stab phlebectomy: left leg  BP (!) 157/76   Pulse (!) 57   Temp 97.6 F (36.4 C)   Resp 16   Ht 5\' 10"  (1.778 m)   Wt 180 lb (81.6 kg)   SpO2 99%   BMI 25.83 kg/m   Start time: 2:10 pm   End time: 3:30pm   Tumescent Anesthesia: 400 cc 0.9% NaCl with 50 cc Lidocaine HCL with 1% Epi and 15 cc 8.4% NaHCO3  Local Anesthesia: 8 cc Lidocaine HCL and NaHCO3 (ratio 2:1)    Stab Phlebectomy: >20 Sites: Thigh and Calf  Patient tolerated procedure well: Yes  Notes:   Description of Procedure:  After marking the course of the secondary varicosities, the patient was placed on the operating table in the supine position, and the left leg was prepped and draped in sterile fashion.    The patient was then put into Trendelenburg position.  Local anesthetic was administered at the previously marked varicosities, and tumescent anesthesia was administered around the vessels.  Greater than 20 stab wounds were made using the tip of an 11 blade. And using the vein hook, the phlebectomies were performed using a hemostat to avulse the varicosities.  Adequate hemostasis was achieved, and steri strips were applied to the stab wound.      ABD pads and thigh high compression stockings were applied as well ace wraps where needed. Blood loss was less than 15 cc.  The patient ambulated out of the operating room having tolerated the procedure well.

## 2017-09-21 NOTE — Progress Notes (Signed)
   Patient name: [name] [name] MRN: [id number] DOB: [birth year] Sex: male  REASON FOR VISIT:   For stab phlebectomies left lower extremity  HPI:   [name] [name] is a pleasant 77 y.o. male who just recently underwent stab phlebectomies of painful varicose veins of the right lower extremity.  He presents for staged stab phlebectomies in the left lower extremity.  Current Outpatient Medications  Medication Sig Dispense Refill  . aspirin 81 MG tablet Take 1 tablet (81 mg total) by mouth daily. 30 tablet 6  . BEE POLLEN PO Take by mouth 2 (two) times daily.    . Multiple Vitamin (MULTIVITAMIN) tablet Take 1 tablet by mouth daily. Centrum silver- takes 5 days a week    . Red Yeast Rice Extract (RED YEAST RICE PO) Take by mouth.    . Saw Palmetto, Serenoa repens, (SAW PALMETTO PO) Take 1 tablet by mouth daily.    . vitamin C (ASCORBIC ACID) 500 MG tablet Take 500 mg by mouth daily. 5 days a week     No current facility-administered medications for this visit.     REVIEW OF SYSTEMS:  [X]  denotes positive finding, [ ]  denotes negative finding Vascular    Leg swelling    Cardiac    Chest pain or chest pressure:    Shortness of breath upon exertion:    Short of breath when lying flat:    Irregular heart rhythm:    Constitutional    Fever or chills:     PHYSICAL EXAM:   Vitals:   09/21/17 1407  BP: (!) 157/76  Pulse: (!) 57  Resp: 16  Temp: 97.6 F (36.4 C)  SpO2: 99%  Weight: 180 lb (81.6 kg)  Height: 5\' 10"  (1.778 m)    GENERAL: The patient is a well-nourished male, in no acute distress. The vital signs are documented above.  DATA:   No new data.  MEDICAL ISSUES:   GREATER THAN 20 STAB PHLEBECTOMIES LEFT LOWER EXTREMITY: The patient was brought to the procedure room.  The veins were marked with the patient standing.  The patient was then placed supine in the left lower extremity was prepped and draped in usual sterile fashion.  Next I administered tumescent anesthesia  and all of the areas that were marked.  Using multiple's short stab incisions with an 11 blade the veins were retracted into the wound and then using hemostats and blunt dissection the veins were evulsed.  Pressure was held for hemostasis.  A pressure dressing was then applied.  Patient tolerated the procedure well.  He will follow-up with Kathlee Nations for sclerotherapy.  Deitra Mayo [facility] [phone]

## 2017-10-10 DIAGNOSIS — H903 Sensorineural hearing loss, bilateral: Secondary | ICD-10-CM | POA: Diagnosis not present

## 2017-10-12 DIAGNOSIS — N400 Enlarged prostate without lower urinary tract symptoms: Secondary | ICD-10-CM | POA: Diagnosis not present

## 2017-10-12 DIAGNOSIS — N5201 Erectile dysfunction due to arterial insufficiency: Secondary | ICD-10-CM | POA: Diagnosis not present

## 2017-10-12 DIAGNOSIS — C61 Malignant neoplasm of prostate: Secondary | ICD-10-CM | POA: Diagnosis not present

## 2017-10-19 ENCOUNTER — Ambulatory Visit: Payer: PPO | Admitting: Vascular Surgery

## 2017-10-24 ENCOUNTER — Ambulatory Visit (INDEPENDENT_AMBULATORY_CARE_PROVIDER_SITE_OTHER): Payer: PPO | Admitting: *Deleted

## 2017-10-24 ENCOUNTER — Ambulatory Visit: Payer: PPO | Admitting: *Deleted

## 2017-10-24 DIAGNOSIS — I83893 Varicose veins of bilateral lower extremities with other complications: Secondary | ICD-10-CM | POA: Diagnosis not present

## 2017-10-24 DIAGNOSIS — I868 Varicose veins of other specified sites: Secondary | ICD-10-CM

## 2017-10-24 NOTE — Progress Notes (Signed)
X=.3% Sotradecol administered with a 27g butterfly.  Patient received a total of 12cc.  Treated all areas of concern easily. Needed 2 syringes. Easy access. Tol well. Hopefully this will decrease the threat of further hemorrhages.  Photos: Yes.    Compression stockings applied: Yes.

## 2017-10-25 ENCOUNTER — Encounter: Payer: Self-pay | Admitting: Vascular Surgery

## 2017-10-30 DIAGNOSIS — H903 Sensorineural hearing loss, bilateral: Secondary | ICD-10-CM | POA: Diagnosis not present

## 2017-11-02 ENCOUNTER — Ambulatory Visit (INDEPENDENT_AMBULATORY_CARE_PROVIDER_SITE_OTHER): Payer: PPO

## 2017-11-02 DIAGNOSIS — Z23 Encounter for immunization: Secondary | ICD-10-CM

## 2017-11-15 ENCOUNTER — Ambulatory Visit: Payer: PPO | Admitting: Vascular Surgery

## 2017-11-21 ENCOUNTER — Ambulatory Visit: Payer: PPO | Admitting: *Deleted

## 2017-11-21 DIAGNOSIS — I83893 Varicose veins of bilateral lower extremities with other complications: Secondary | ICD-10-CM

## 2017-11-21 NOTE — Progress Notes (Signed)
Patient came for his last ins covered tx but he doesn't need one. All areas are resolving well. He no longer looks like he will hemorrhage from his spider veins. This completes his tx and we will follow prn.

## 2017-11-24 ENCOUNTER — Ambulatory Visit: Payer: PPO

## 2018-02-14 ENCOUNTER — Telehealth: Payer: Self-pay

## 2018-02-14 NOTE — Telephone Encounter (Signed)
Called pt, LVM.   

## 2018-02-14 NOTE — Telephone Encounter (Signed)
Copied from [place]. Topic: General - Other >> Feb 14, 2018 11:37 AM Carolyn Stare wrote:  Pt said the dermatologist has given him Triamcinolone acetonide for a itch. Pt said Dr Jenny Reichmann  told him that was not the best cream for that itch and told pt there was another kind. He is calling to ask Dr Jenny Reichmann if he can RX the cream or lotion he suggested    Pharmacy [facility] [address]

## 2018-02-14 NOTE — Telephone Encounter (Signed)
That would be the best for the long term, though there are other more potent topical steroids.  OK to encourage pt to ask dermatologist if would be appropriate for more potent steroid

## 2018-05-07 DIAGNOSIS — L821 Other seborrheic keratosis: Secondary | ICD-10-CM | POA: Diagnosis not present

## 2018-05-07 DIAGNOSIS — L508 Other urticaria: Secondary | ICD-10-CM | POA: Diagnosis not present

## 2018-05-24 ENCOUNTER — Telehealth: Payer: Self-pay

## 2018-05-24 NOTE — Telephone Encounter (Signed)
Left patient a message to call back and switch appointment with Lyda Jester, PA from May 12 to May 11 (Monday).

## 2018-05-25 NOTE — Telephone Encounter (Signed)
   TELEPHONE CALL NOTE  This patient has been deemed a candidate for follow-up tele-health visit to limit community exposure during the Covid-19 pandemic. I left a message with the patient via phone to discuss instructions.  A Virtual Office Visit appointment type has been scheduled for 5/11 @ 11:00 with Lyda Jester, PA, with "VIDEO" or "TELEPHONE" in the appointment notes - patient prefers Telephone (according to scheduler) type.   Frederik Schmidt, RN 05/25/2018 10:04 AM   According to scheduler, patient has consented to a Virtual Visit by Phone on 5/11 with Lyda Jester, PA

## 2018-05-25 NOTE — Telephone Encounter (Signed)
New Message ° ° °Patient returning your call. °

## 2018-05-25 NOTE — Telephone Encounter (Signed)
Left patient message in regards to his Virtual Visit on 5/11.

## 2018-05-25 NOTE — Telephone Encounter (Signed)
[name] [name], our scheduler informed me that she spoke to the patient on 5/7 and confirmed that the patient would like a Phone Virtual Visit on 5/11 @ 11 am with Lyda Jester, PA.

## 2018-05-28 ENCOUNTER — Telehealth (INDEPENDENT_AMBULATORY_CARE_PROVIDER_SITE_OTHER): Payer: PPO | Admitting: Cardiology

## 2018-05-28 ENCOUNTER — Other Ambulatory Visit: Payer: Self-pay

## 2018-05-28 ENCOUNTER — Encounter: Payer: Self-pay | Admitting: Cardiology

## 2018-05-28 VITALS — Ht 70.0 in | Wt 180.0 lb

## 2018-05-28 DIAGNOSIS — E785 Hyperlipidemia, unspecified: Secondary | ICD-10-CM

## 2018-05-28 DIAGNOSIS — I2510 Atherosclerotic heart disease of native coronary artery without angina pectoris: Secondary | ICD-10-CM | POA: Insufficient documentation

## 2018-05-28 NOTE — Patient Instructions (Signed)
Medication Instructions:  none If you need a refill on your cardiac medications before your next appointment, please call your pharmacy.   Lab work: none If you have labs (blood work) drawn today and your tests are completely normal, you will receive your results only by: Marland Kitchen MyChart Message (if you have MyChart) OR . A paper copy in the mail If you have any lab test that is abnormal or we need to change your treatment, we will call you to review the results.  Testing/Procedures: none  Follow-Up: 1 year with Dr Angelena Form At [facility], you and your health needs are our priority.  As part of our continuing mission to provide you with exceptional heart care, we have created designated Provider Care Teams.  These Care Teams include your primary Cardiologist (physician) and Advanced Practice Providers (APPs -  Physician Assistants and Nurse Practitioners) who all work together to provide you with the care you need, when you need it. Any Other Special Instructions Will Be Listed Below (If Applicable).  Referral to Lipid Clinic with pharmacist for PCSK9 inhibitor education.  Someone will call you.

## 2018-05-28 NOTE — Progress Notes (Signed)
Virtual Visit via Telephone Note   This visit type was conducted due to national recommendations for restrictions regarding the COVID-19 Pandemic (e.g. social distancing) in an effort to limit this patient's exposure and mitigate transmission in our community.  Due to his co-morbid illnesses, this patient is at least at moderate risk for complications without adequate follow up.  This format is felt to be most appropriate for this patient at this time.  The patient did not have access to video technology/had technical difficulties with video requiring transitioning to audio format only (telephone).  All issues noted in this document were discussed and addressed.  No physical exam could be performed with this format.  Please refer to the patient's chart for his  consent to telehealth for [facility].    Date:  05/28/2018   ID:  [name] [name], DOB 1940/02/18, MRN [id number]  Patient Location: Home Provider Location: Home  PCP:  Biagio Borg, MD  Cardiologist:  Lauree Chandler, MD  Electrophysiologist:  None   Evaluation Performed:  Follow-Up Visit  Chief Complaint:  F/u for CAD   History of Present Illness:    78 yo male, followed by Dr. Angelena Form, with history of CAD s/p 3V CABG 02/10/12 (LIMA to LAD, RIMA to RCA, free radial to OM), hyperlipidemia, prostate cancer and CVA. He presented with unstable angina in January 2014. Cardiac cath 02/10/12 with 99% ostial left main stenosis, 99% ostial RCA stenosis. 3V CABG on 02/10/12. He has done well following his CABG. He has not tolerated statins. Echo April 2018 with normal LV function, mild AI, mild MR. He also has  varicose veins,  managed by vascular surgery.  Pt reports that they have done well from a symptom standpoint since their last office visit. They deny chest pain and dyspnea. No exertional symptoms w/ exercise or ADLs. Pt also denies orthopnea, PND, LEE, palpitations, dizziness, syncope/ near syncope and claudication.   They  report full medication compliance. No intolerances or medication side effects. Pt also denies any hospitalizations,  ED/urgent care visits or surgeries since their last office visit.  He is not currently home to take his BP but reports it has been controlled in the 409W systolic. HR typically in the mid 50s and asymptomatic. He notes some occasional bruising w/ low dose ASA but no other bleeding.   The patient does not have symptoms concerning for COVID-19 infection (fever, chills, cough, or new shortness of breath).    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   no evidence of prostate cancer per pt 11-13-14   Allergy    BRADYCARDIA 09/20/2007   CAD (coronary artery disease)    CEREBROVASCULAR ACCIDENT, HX OF 09/20/2007   COLONIC POLYPS, HX OF 12/18/2006   DIVERTICULOSIS, COLON 09/20/2007   HYPERLIPIDEMIA 12/18/2006   PERIPHERAL VASCULAR DISEASE 09/20/2007   TIA 12/03/2007   Varicose veins    Past Surgical History:  Procedure Laterality Date   COLONOSCOPY  06-05-2009   last colon 06-05-2009 w/ patterson-polyps   CORONARY ARTERY BYPASS GRAFT  02/10/2012   Procedure: CORONARY ARTERY BYPASS GRAFTING (CABG);  Surgeon: Ivin Poot, MD;  Location: [place];  Service: Open Heart Surgery;  Laterality: N/A;  CABG x three, using Bilateral mammary artery harvest and left arm radial harvest   INTRAOPERATIVE TRANSESOPHAGEAL ECHOCARDIOGRAM  02/10/2012   Procedure: INTRAOPERATIVE TRANSESOPHAGEAL ECHOCARDIOGRAM;  Surgeon: Ivin Poot, MD;  Location: [place];  Service: Open Heart Surgery;  Laterality: N/A;   LEFT HEART  CATHETERIZATION WITH CORONARY ANGIOGRAM N/A 02/10/2012   Procedure: LEFT HEART CATHETERIZATION WITH CORONARY ANGIOGRAM;  Surgeon: Burnell Blanks, MD;  Location: [facility] CATH LAB;  Service: Cardiovascular;  Laterality: N/A;   POLYPECTOMY     hx TA colon polyps   RADIAL ARTERY HARVEST  02/10/2012   Procedure: RADIAL ARTERY HARVEST;  Surgeon: Ivin Poot, MD;  Location: [place];  Service: Open Heart Surgery;  Laterality: Left;     Current Meds  Medication Sig   aspirin 81 MG tablet Take 1 tablet (81 mg total) by mouth daily.   augmented betamethasone dipropionate (DIPROLENE-AF) 0.05 % cream Use as directed   BEE POLLEN PO Take by mouth daily.    Multiple Vitamin (MULTIVITAMIN) tablet Take 1 tablet by mouth daily. Geritol- takes 5 days a week   Red Yeast Rice Extract (RED YEAST RICE PO) Take by mouth.   Saw Palmetto, Serenoa repens, (SAW PALMETTO PO) Take 1 tablet by mouth daily.   triamcinolone cream (KENALOG) 0.1 % Use as directed   vitamin C (ASCORBIC ACID) 500 MG tablet Take 500 mg by mouth daily. 5 days a week     Allergies:   Niacin and Simvastatin   Social History   Tobacco Use   Smoking status: Never Smoker   Smokeless tobacco: Never Used  Substance Use Topics   Alcohol use: No    Alcohol/week: 0.0 standard drinks   Drug use: No     Family Hx: The patient's family history includes Cancer in his brother; Colon cancer (age of onset: 63) in his brother; Heart disease in his father.  ROS:   Please see the history of present illness.     All other systems reviewed and are negative.   Prior CV studies:   The following studies were reviewed today:  2D Echo 05/16/16: - Left ventricle: The cavity size was normal. There was mild focal basal hypertrophy of the septum. Systolic function was normal. The estimated ejection fraction was in the range of 55% to 60%. Wall motion was normal; there were no regional wall motion abnormalities. Features are consistent with a pseudonormal left ventricular filling pattern, with concomitant abnormal relaxation and increased filling pressure (grade 2 diastolic dysfunction). - Aortic valve: There was mild regurgitation. - Aortic root: The aortic root was mildly dilated. - Mitral valve: There was mild regurgitation. - Left atrium: The atrium was mildly  dilated. - Pulmonary arteries: Systolic pressure was mildly increased. PA peak pressure: 36 mm Hg (S).  Impressions:  - Normal LV systolic function; moderate diastolic dysfunction; sclerotic aortic valve with mild AI; mildly dilated aortic root; mild MR; mild LAE; mild TR with mildly elevated pulmonary pressure.  Cardiac cath 02/10/12:  Left main: Distal 99% stenosis.  Left Anterior Descending Artery: Large caliber vessel that courses to the apex. There is calcification in the proximal vessel. The proximal vessel has a 90% stenosis. The mid vessel has serial 50% stenoses. The distal vessel has mild plaque disease. The diagonal branch is small to moderate sized and has mild plaque disease.  Circumflex Artery: Large caliber, calcified vessel with 30% stenosis in the proximal vessel. The obtuse marginal is moderate in caliber and has 30% serial stenoses.  Right Coronary Artery: Large, dominant vessel with 99% ostial stenosis. The mid vessel has serial 30% stenoses with moderate calcification. The distal vessel has a 40-50% stenosis just before the bifurcation. The PDA and posterolateral branches are moderate in caliber with diffuse plaque but no focally obstructive lesions.  Left Ventricular Angiogram: LVEF=60-65%.   Labs/Other Tests and Data Reviewed:    EKG:  An ECG dated 05/26/2017 was personally reviewed today and demonstrated:  marked sinus bradycardia 49 bpm  Recent Labs: No results found for requested labs within last 8760 hours.   Recent Lipid Panel Lab Results  Component Value Date/Time   CHOL 207 (H) 03/21/2017 10:37 AM   TRIG 126.0 03/21/2017 10:37 AM   TRIG 61 12/16/2005 07:53 AM   HDL 44.40 03/21/2017 10:37 AM   CHOLHDL 5 03/21/2017 10:37 AM   LDLCALC 137 (H) 03/21/2017 10:37 AM   LDLDIRECT 179.4 12/23/2010 09:35 AM    Wt Readings from Last 3 Encounters:  05/28/18 180 lb (81.6 kg)  09/21/17 180 lb (81.6 kg)  09/14/17 180 lb (81.6 kg)     Objective:     Vital Signs:  Ht 5\' 10"  (1.778 m)    Wt 180 lb (81.6 kg)    BMI 25.83 kg/m    Physical Exam: Well Sounding male in no acute distress. Speaking in clear complete sentences. Speech unlabored. Breathing unlabored.   ASSESSMENT & PLAN:    1. CAD:  s/p 3V CABG 02/10/12 (LIMA to LAD, RIMA to RCA, free radial to OM). EF normal at time of bypass. He has done well w/o any anginal symptoms. He is on ASA. No  blocker due to h/o bradycardia. Not on statin due to intolerance.   2.  HLD: statin intolerant. Last lipid panel 03/21/2017 showed elevated LDL at 137 mg/dL. Currently using red yeast rice. Given native multivessel diease, LDL goal < 70 mg/dL.  We discussed PCSK9i and he is open to this. Will refer to lipid clinic for consideration for PCSK9 inhibitors.   3. Varicose Veins:  Managed by vascular surgery.   4. Asymptomatic Bradycardia: last office EKG showed sinus brady in the upper 40s. Pt reports baseline HR at home is typically in the mid 5s. He has been asymptomatic. No syncope/ near syncope, fatigue or palpitations. Continue to avoid  blockers   COVID-19 Education: The signs and symptoms of COVID-19 were discussed with the patient and how to seek care for testing (follow up with PCP or arrange E-visit).  The importance of social distancing was discussed today.  Time:   Today, I have spent 15 minutes with the patient with telehealth technology discussing the above problems.     Medication Adjustments/Labs and Tests Ordered: Current medicines are reviewed at length with the patient today.  Concerns regarding medicines are outlined above.   Tests Ordered: No orders of the defined types were placed in this encounter.   Medication Changes: No orders of the defined types were placed in this encounter.   Disposition:  Follow up in 1 yr w/ Dr. Angelena Form  Signed, Lyda Jester, PA-C  05/28/2018 11:30 AM    [place]

## 2018-05-29 ENCOUNTER — Telehealth: Payer: PPO | Admitting: Cardiology

## 2018-06-01 ENCOUNTER — Telehealth: Payer: Self-pay | Admitting: Pharmacist

## 2018-06-01 DIAGNOSIS — E785 Hyperlipidemia, unspecified: Secondary | ICD-10-CM

## 2018-06-01 NOTE — Telephone Encounter (Addendum)
Patient ID: [name] [name]                 DOB: [birth year]                    MRN: [id number]     HPI: [name] [name] is a 78 y.o. male is a patient of Dr. Camillia Herter  referred to lipid clinic by Lyda Jester, PA. PMH is significant for  CAD s/p CABG in 02/10/12, hyperlipidemia, prostate cancer, and CVA.   Conducted telehealth visit due to current need of limiting in-office visits with current Coronavirus pandemic. Patient's last lipid panel was in March of last year with a reported LDL of 137. Due to clinical ASCVD with history of stroke and CAD s/p CABG, LDL goal should be <70. Patient has been on atorvastatin in the past and has not been able to tolerate in the past due to continued muscle soreness. Patient has also been on rosuvastatin 5 mg three times weekly which was also later discontinued due to muscle weakness. Patient has also taken red yeast rice in the past, however, reports he stopped taking 6 months ago after running out of supply.    Current Medications: no therapy Intolerances: atorvastatin 20mg  and 40mg , rosuvastatin 5mg  TIW - muscle weakness  Risk Factors: CAD s/p CABG 2014, CVA LDL goal: <70  Diet: Patient states about 75% of his diet consists of vegetables. A typical breakfast includes bananas, yogurt, cereals (usually raisin brand and special K), and egg sandwiches. Patient does state that he has cut back on eating as many egg sandwiches as he has been trying to decrease bread intake overall. Patient reports he typically does not eat lunch. When he does it may include a burger or left over dinner from the night before. Patient does state that he does occasionally have some fast food for lunch, however has not had any in the last 3 months to limit going outside during the coronavirus pandemic. For dinner, patient reports usually eating a home cooked meal that consists of greens, pasta, lean meats, and vegetables.   Exercise: Brisk walking about 4-5 times a week for about 20  minutes or more. When his gym is open he went there about 4-5 times a week.   Family History: colon cancer (onset age of 41) in brother, heart disease in father  Social History: denies smoking, alcohol and drug use   Labs: Lipid panel 03/21/2017: LDL 137, TC 207, TG 126, HDL 44.4, non-HDL 162 (red yeast rice)   Past Medical History:  Diagnosis Date  . 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 Allergy   . BRADYCARDIA 09/20/2007  . CAD (coronary artery disease)   . CEREBROVASCULAR ACCIDENT, HX OF 09/20/2007  . COLONIC POLYPS, HX OF 12/18/2006  . DIVERTICULOSIS, COLON 09/20/2007  . HYPERLIPIDEMIA 12/18/2006  . PERIPHERAL VASCULAR DISEASE 09/20/2007  . TIA 12/03/2007  . Varicose veins     Current Outpatient Medications on File Prior to Visit  Medication Sig Dispense Refill  . aspirin 81 MG tablet Take 1 tablet (81 mg total) by mouth daily. 30 tablet 6  . augmented betamethasone dipropionate (DIPROLENE-AF) 0.05 % cream Use as directed    . BEE POLLEN PO Take by mouth daily.     . Multiple Vitamin (MULTIVITAMIN) tablet Take 1 tablet by mouth daily. Geritol- takes 5 days a week    . Red Yeast Rice Extract (RED YEAST RICE PO) Take by mouth.    Marland Kitchen  Saw Palmetto, Serenoa repens, (SAW PALMETTO PO) Take 1 tablet by mouth daily.    Marland Kitchen triamcinolone cream (KENALOG) 0.1 % Use as directed    . vitamin C (ASCORBIC ACID) 500 MG tablet Take 500 mg by mouth daily. 5 days a week     No current facility-administered medications on file prior to visit.     Allergies  Allergen Reactions  . Niacin     REACTION: hives  . Simvastatin Other (See Comments)    myalgias   -pravastatin 10mg  at night (split tablets in half) + zetia 10 mg daily starting one week later  -stop red yeast rice -If patient doesn't want to retry statin, start Repatha instead ($80/mo)    Assessment/Plan:  1. Hyperlipidemia - Patient is being followed by lipid clinic for hyperlipidemia  management. Last lipid panel in March of 2019 reports LDL above goal at 137 (goal is <70 with clinical ASCVD secondary to stroke and CAD s/p CABG). Started patient on pravastatin 10 mg daily and sent prescription to patient's pharmacy. Instructed patient to split the 20 mg tablet sent to the pharmacy. Also spoke to patient about the addition of Zetia to further help LDL to get into goal. Patient states that he wishes to wait on starting Zetia at this time but may consider after next lipid panel. Encouraged patient to continue healthy diet modifications. Will follow up with patient in lipid clinic after next lipid panels scheduled for August 10th. Instructed patient to fast starting 8 pm the night before his lipid labs. Patient communicated understanding and was instructed to call pharmacy clinic with any questions.   Gwenlyn Found, [place] D PGY1 Pharmacy Resident  06/06/2018   2:27 PM

## 2018-06-06 MED ORDER — PRAVASTATIN SODIUM 20 MG PO TABS: 10.0000 mg | ORAL_TABLET | Freq: Every day | ORAL | 3 refills | Status: DC

## 2018-06-28 ENCOUNTER — Telehealth: Payer: Self-pay | Admitting: Cardiology

## 2018-06-28 ENCOUNTER — Other Ambulatory Visit: Payer: Self-pay

## 2018-06-28 NOTE — Telephone Encounter (Signed)
Ok that is fine

## 2018-06-28 NOTE — Telephone Encounter (Signed)
New message   FYI  Called pt to schedule lipid clinic appt.  He said he is not taking the pravastatin 20mg .  He decided to go back to taking red yeast rice extract for his cholesterol and declined the lipid clinic appt.

## 2018-07-22 ENCOUNTER — Encounter

## 2018-08-27 ENCOUNTER — Other Ambulatory Visit: Payer: PPO

## 2018-08-28 ENCOUNTER — Telehealth: Payer: Self-pay | Admitting: Pharmacist

## 2018-08-28 NOTE — Telephone Encounter (Signed)
Pt missed lab appt to have cholesterol checked, left message for pt to reschedule.

## 2018-10-08 DIAGNOSIS — B9689 Other specified bacterial agents as the cause of diseases classified elsewhere: Secondary | ICD-10-CM | POA: Diagnosis not present

## 2018-10-08 DIAGNOSIS — L02229 Furuncle of trunk, unspecified: Secondary | ICD-10-CM | POA: Diagnosis not present

## 2018-10-08 DIAGNOSIS — L820 Inflamed seborrheic keratosis: Secondary | ICD-10-CM | POA: Diagnosis not present

## 2018-10-11 ENCOUNTER — Encounter (HOSPITAL_COMMUNITY): Payer: PPO

## 2018-10-11 ENCOUNTER — Ambulatory Visit: Payer: PPO | Admitting: Vascular Surgery

## 2018-10-12 DIAGNOSIS — H6121 Impacted cerumen, right ear: Secondary | ICD-10-CM | POA: Diagnosis not present

## 2018-10-24 ENCOUNTER — Encounter: Payer: PPO | Admitting: Internal Medicine

## 2018-11-14 ENCOUNTER — Other Ambulatory Visit: Payer: Self-pay

## 2018-11-14 DIAGNOSIS — I83893 Varicose veins of bilateral lower extremities with other complications: Secondary | ICD-10-CM

## 2018-11-15 ENCOUNTER — Encounter: Payer: Self-pay | Admitting: Internal Medicine

## 2018-11-15 ENCOUNTER — Ambulatory Visit (INDEPENDENT_AMBULATORY_CARE_PROVIDER_SITE_OTHER): Payer: PPO | Admitting: Internal Medicine

## 2018-11-15 ENCOUNTER — Other Ambulatory Visit (INDEPENDENT_AMBULATORY_CARE_PROVIDER_SITE_OTHER): Payer: PPO

## 2018-11-15 ENCOUNTER — Other Ambulatory Visit: Payer: Self-pay

## 2018-11-15 VITALS — BP 146/88 | HR 56 | Temp 97.6°F | Ht 70.0 in | Wt 180.0 lb

## 2018-11-15 DIAGNOSIS — Z0000 Encounter for general adult medical examination without abnormal findings: Secondary | ICD-10-CM | POA: Diagnosis not present

## 2018-11-15 DIAGNOSIS — E538 Deficiency of other specified B group vitamins: Secondary | ICD-10-CM

## 2018-11-15 DIAGNOSIS — E611 Iron deficiency: Secondary | ICD-10-CM

## 2018-11-15 DIAGNOSIS — E7800 Pure hypercholesterolemia, unspecified: Secondary | ICD-10-CM

## 2018-11-15 DIAGNOSIS — E559 Vitamin D deficiency, unspecified: Secondary | ICD-10-CM | POA: Diagnosis not present

## 2018-11-15 DIAGNOSIS — C61 Malignant neoplasm of prostate: Secondary | ICD-10-CM | POA: Diagnosis not present

## 2018-11-15 LAB — CBC WITH DIFFERENTIAL/PLATELET
Basophils Absolute: 0 10*3/uL (ref 0.0–0.1)
Basophils Relative: 0.6 % (ref 0.0–3.0)
Eosinophils Absolute: 0.1 10*3/uL (ref 0.0–0.7)
Eosinophils Relative: 2 % (ref 0.0–5.0)
HCT: 48.9 % (ref 39.0–52.0)
Hemoglobin: 16.7 g/dL (ref 13.0–17.0)
Lymphocytes Relative: 12.9 % (ref 12.0–46.0)
Lymphs Abs: 0.8 10*3/uL (ref 0.7–4.0)
MCHC: 34.1 g/dL (ref 30.0–36.0)
MCV: 99 fl (ref 78.0–100.0)
Monocytes Absolute: 0.7 10*3/uL (ref 0.1–1.0)
Monocytes Relative: 11.7 % (ref 3.0–12.0)
Neutro Abs: 4.4 10*3/uL (ref 1.4–7.7)
Neutrophils Relative %: 72.8 % (ref 43.0–77.0)
Platelets: 231 10*3/uL (ref 150.0–400.0)
RBC: 4.94 Mil/uL (ref 4.22–5.81)
RDW: 12.3 % (ref 11.5–15.5)
WBC: 6.1 10*3/uL (ref 4.0–10.5)

## 2018-11-15 LAB — LIPID PANEL
Cholesterol: 254 mg/dL — ABNORMAL HIGH (ref 0–200)
HDL: 55.9 mg/dL (ref >39.00)
LDL Cholesterol: 177 mg/dL — ABNORMAL HIGH (ref 0–99)
NonHDL: 198.06
Total CHOL/HDL Ratio: 5
Triglycerides: 106 mg/dL (ref 0.0–149.0)
VLDL: 21.2 mg/dL (ref 0.0–40.0)

## 2018-11-15 LAB — URINALYSIS, ROUTINE W REFLEX MICROSCOPIC
Bilirubin Urine: NEGATIVE
Hgb urine dipstick: NEGATIVE
Ketones, ur: NEGATIVE
Leukocytes,Ua: NEGATIVE
Nitrite: NEGATIVE
RBC / HPF: NONE SEEN (ref 0–?)
Specific Gravity, Urine: 1.01 (ref 1.000–1.030)
Total Protein, Urine: NEGATIVE
Urine Glucose: NEGATIVE
Urobilinogen, UA: 0.2 (ref 0.0–1.0)
pH: 7.5 (ref 5.0–8.0)

## 2018-11-15 LAB — HEPATIC FUNCTION PANEL
ALT: 17 U/L (ref 0–53)
AST: 22 U/L (ref 0–37)
Albumin: 4.3 g/dL (ref 3.5–5.2)
Alkaline Phosphatase: 70 U/L (ref 39–117)
Bilirubin, Direct: 0.1 mg/dL (ref 0.0–0.3)
Total Bilirubin: 0.8 mg/dL (ref 0.2–1.2)
Total Protein: 6.8 g/dL (ref 6.0–8.3)

## 2018-11-15 LAB — IBC PANEL
Iron: 145 ug/dL (ref 42–165)
Saturation Ratios: 53.4 % — ABNORMAL HIGH (ref 20.0–50.0)
Transferrin: 194 mg/dL — ABNORMAL LOW (ref 212.0–360.0)

## 2018-11-15 LAB — BASIC METABOLIC PANEL
BUN: 13 mg/dL (ref 6–23)
CO2: 31 mEq/L (ref 19–32)
Calcium: 9.4 mg/dL (ref 8.4–10.5)
Chloride: 99 mEq/L (ref 96–112)
Creatinine, Ser: 1.03 mg/dL (ref 0.40–1.50)
GFR: 69.83 mL/min (ref >60.00)
Glucose, Bld: 102 mg/dL — ABNORMAL HIGH (ref 70–99)
Potassium: 4.5 mEq/L (ref 3.5–5.1)
Sodium: 136 mEq/L (ref 135–145)

## 2018-11-15 NOTE — Assessment & Plan Note (Signed)
Statin intolerant, for lower chol diet  

## 2018-11-15 NOTE — Progress Notes (Signed)
Subjective:    Patient ID: [name] [name], male    DOB: [birth year], 78 y.o.   MRN: [id number]  HPI  Here for wellness and f/u;  Overall doing ok;  Pt denies Chest pain, worsening SOB, DOE, wheezing, orthopnea, PND, worsening LE edema, palpitations, dizziness or syncope.  Pt denies neurological change such as new headache, facial or extremity weakness.  Pt denies polydipsia, polyuria, or low sugar symptoms. Pt states overall good compliance with treatment and medications, good tolerability, and has been trying to follow appropriate diet.  Pt denies worsening depressive symptoms, suicidal ideation or panic. No fever, night sweats, wt loss, loss of appetite, or other constitutional symptoms.  Pt states good ability with ADL's, has low fall risk, home safety reviewed and adequate, no other significant changes in hearing or vision, and only occasionally active with exercise.  Has ongoing rash followed by derm, tx with ointment and doxycycline course, seems to be somewhat better.   C/o easy bruising with thin skin.  Has f/u appt with urology later this year. Past Medical History:  Diagnosis Date  . 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 Allergy   . BRADYCARDIA 09/20/2007  . CAD (coronary artery disease)   . CEREBROVASCULAR ACCIDENT, HX OF 09/20/2007  . COLONIC POLYPS, HX OF 12/18/2006  . DIVERTICULOSIS, COLON 09/20/2007  . HYPERLIPIDEMIA 12/18/2006  . PERIPHERAL VASCULAR DISEASE 09/20/2007  . TIA 12/03/2007  . Varicose veins    Past Surgical History:  Procedure Laterality Date  . COLONOSCOPY  06-05-2009   last colon 06-05-2009 w/ patterson-polyps  . CORONARY ARTERY BYPASS GRAFT  02/10/2012   Procedure: CORONARY ARTERY BYPASS GRAFTING (CABG);  Surgeon: Ivin Poot, MD;  Location: [place];  Service: Open Heart Surgery;  Laterality: N/A;  CABG x three, using Bilateral mammary artery harvest and left arm radial harvest  . INTRAOPERATIVE TRANSESOPHAGEAL  ECHOCARDIOGRAM  02/10/2012   Procedure: INTRAOPERATIVE TRANSESOPHAGEAL ECHOCARDIOGRAM;  Surgeon: Ivin Poot, MD;  Location: [place];  Service: Open Heart Surgery;  Laterality: N/A;  . LEFT HEART CATHETERIZATION WITH CORONARY ANGIOGRAM N/A 02/10/2012   Procedure: LEFT HEART CATHETERIZATION WITH CORONARY ANGIOGRAM;  Surgeon: Burnell Blanks, MD;  Location: [facility] CATH LAB;  Service: Cardiovascular;  Laterality: N/A;  . POLYPECTOMY     hx TA colon polyps  . RADIAL ARTERY HARVEST  02/10/2012   Procedure: RADIAL ARTERY HARVEST;  Surgeon: Ivin Poot, MD;  Location: [place];  Service: Open Heart Surgery;  Laterality: Left;    reports that he has never smoked. He has never used smokeless tobacco. He reports that he does not drink alcohol or use drugs. family history includes Cancer in his brother; Colon cancer (age of onset: 46) in his brother; Heart disease in his father. Allergies  Allergen Reactions  . Niacin     REACTION: hives  . Simvastatin Other (See Comments)    myalgias   Current Outpatient Medications on File Prior to Visit  Medication Sig Dispense Refill  . aspirin 81 MG tablet Take 1 tablet (81 mg total) by mouth daily. 30 tablet 6  . augmented betamethasone dipropionate (DIPROLENE-AF) 0.05 % cream Use as directed    . BEE POLLEN PO Take by mouth daily.     . Multiple Vitamin (MULTIVITAMIN) tablet Take 1 tablet by mouth daily. Geritol- takes 5 days a week    . Red Yeast Rice Extract (RED YEAST RICE PO) Take by mouth.    . Saw  Palmetto, Serenoa repens, (SAW PALMETTO PO) Take 1 tablet by mouth daily.    Marland Kitchen triamcinolone cream (KENALOG) 0.1 % Use as directed    . vitamin C (ASCORBIC ACID) 500 MG tablet Take 500 mg by mouth daily. 5 days a week     No current facility-administered medications on file prior to visit.    Review of Systems VS noted,  Constitutional: Pt is oriented to person, place, and time. Appears well-developed and well-nourished, in no significant distress and  comfortable Head: Normocephalic and atraumatic  Eyes: Conjunctivae and EOM are normal. Pupils are equal, round, and reactive to light Right Ear: External ear normal without discharge Left Ear: External ear normal without discharge Nose: Nose without discharge or deformity Mouth/Throat: Oropharynx is without other ulcerations and moist  Neck: Normal range of motion. Neck supple. No JVD present. No tracheal deviation present or significant neck LA or mass Cardiovascular: Normal rate, regular rhythm, normal heart sounds and intact distal pulses.   Pulmonary/Chest: WOB normal and breath sounds without rales or wheezing  Abdominal: Soft. Bowel sounds are normal. NT. No HSM  Musculoskeletal: Normal range of motion. Exhibits no edema Lymphadenopathy: Has no other cervical adenopathy.  Neurological: Pt is alert and oriented to person, place, and time. Pt has normal reflexes. No cranial nerve deficit. Motor grossly intact, Gait intact Skin: Skin is warm and dry. No rash noted or new ulcerations Psychiatric:  Has normal mood and affect. Behavior is normal without agitation All otherwise neg per pt    Objective:   Physical Exam BP (!) 146/88   Pulse (!) 56   Temp 97.6 F (36.4 C) (Oral)   Ht 5\' 10"  (1.778 m)   Wt 180 lb (81.6 kg)   SpO2 99%   BMI 25.83 kg/m  VS noted,  Constitutional: Pt is oriented to person, place, and time. Appears well-developed and well-nourished, in no significant distress and comfortable Head: Normocephalic and atraumatic  Eyes: Conjunctivae and EOM are normal. Pupils are equal, round, and reactive to light Right Ear: External ear normal without discharge Left Ear: External ear normal without discharge Nose: Nose without discharge or deformity Mouth/Throat: Oropharynx is without other ulcerations and moist  Neck: Normal range of motion. Neck supple. No JVD present. No tracheal deviation present or significant neck LA or mass Cardiovascular: Normal rate, regular  rhythm, normal heart sounds and intact distal pulses.   Pulmonary/Chest: WOB normal and breath sounds without rales or wheezing  Abdominal: Soft. Bowel sounds are normal. NT. No HSM  Musculoskeletal: Normal range of motion. Exhibits no edema Lymphadenopathy: Has no other cervical adenopathy.  Neurological: Pt is alert and oriented to person, place, and time. Pt has normal reflexes. No cranial nerve deficit. Motor grossly intact, Gait intact Skin: Skin is warm and dry. No rash noted or new ulcerations Psychiatric:  Has normal mood and affect. Behavior is normal without agitation All otherwise neg per pt Lab Results  Component Value Date   WBC 9.3 03/21/2017   HGB 16.1 03/21/2017   HCT 47.4 03/21/2017   PLT 211.0 03/21/2017   GLUCOSE 84 03/21/2017   CHOL 207 (H) 03/21/2017   TRIG 126.0 03/21/2017   HDL 44.40 03/21/2017   LDLDIRECT 179.4 12/23/2010   LDLCALC 137 (H) 03/21/2017   ALT 12 03/21/2017   AST 20 03/21/2017   NA 134 (L) 03/21/2017   K 4.2 03/21/2017   CL 98 03/21/2017   CREATININE 1.06 03/21/2017   BUN 14 03/21/2017   CO2 30 03/21/2017  TSH 2.93 03/21/2017   PSA 12.39 (H) 03/21/2017   INR 1.40 02/11/2012          Assessment & Plan:

## 2018-11-15 NOTE — Patient Instructions (Addendum)

## 2018-11-15 NOTE — Assessment & Plan Note (Signed)

## 2018-11-15 NOTE — Assessment & Plan Note (Signed)
With increasing psa - for urology f/u as planned

## 2018-11-16 ENCOUNTER — Other Ambulatory Visit: Payer: Self-pay | Admitting: Internal Medicine

## 2018-11-16 ENCOUNTER — Encounter: Payer: Self-pay | Admitting: Internal Medicine

## 2018-11-16 LAB — TSH: TSH: 2.49 u[IU]/mL (ref 0.35–4.50)

## 2018-11-16 LAB — VITAMIN D 25 HYDROXY (VIT D DEFICIENCY, FRACTURES): VITD: 28.15 ng/mL — ABNORMAL LOW (ref 30.00–100.00)

## 2018-11-16 LAB — VITAMIN B12: Vitamin B-12: 329 pg/mL (ref 211–911)

## 2018-11-16 LAB — PSA: PSA: 12.89 ng/mL — ABNORMAL HIGH (ref 0.10–4.00)

## 2018-11-16 MED ORDER — VITAMIN D (ERGOCALCIFEROL) 1.25 MG (50000 UNIT) PO CAPS: 50000.0000 [IU] | ORAL_CAPSULE | ORAL | 0 refills | Status: DC

## 2018-11-16 MED ORDER — ATORVASTATIN CALCIUM 10 MG PO TABS: 10.0000 mg | ORAL_TABLET | Freq: Every day | ORAL | 3 refills | Status: DC

## 2018-11-22 ENCOUNTER — Other Ambulatory Visit: Payer: Self-pay

## 2018-11-22 ENCOUNTER — Ambulatory Visit (HOSPITAL_COMMUNITY)
Admission: RE | Admit: 2018-11-22 | Discharge: 2018-11-22 | Disposition: A | Discharge status: Home / Self Care | Payer: PPO | Source: Ambulatory Visit | Attending: Family | Admitting: Family

## 2018-11-22 ENCOUNTER — Encounter: Payer: Self-pay | Admitting: Vascular Surgery

## 2018-11-22 ENCOUNTER — Ambulatory Visit (INDEPENDENT_AMBULATORY_CARE_PROVIDER_SITE_OTHER): Payer: PPO | Admitting: Vascular Surgery

## 2018-11-22 VITALS — BP 168/81 | HR 57 | Temp 97.9°F | Resp 18 | Ht 70.0 in | Wt 178.0 lb

## 2018-11-22 DIAGNOSIS — I83893 Varicose veins of bilateral lower extremities with other complications: Secondary | ICD-10-CM | POA: Insufficient documentation

## 2018-11-22 DIAGNOSIS — I83813 Varicose veins of bilateral lower extremities with pain: Secondary | ICD-10-CM | POA: Diagnosis not present

## 2018-11-22 NOTE — Progress Notes (Signed)
Patient name: [name] [name] MRN: [id number] DOB: 1940-07-03 Sex: male  REASON FOR VISIT:   Varicose veins bilaterally  HPI:   [name] [name] is a pleasant 78 y.o. male who is undergone previous laser ablations of the great saphenous veins in the past.  He had > 20 stab phlebectomies back in 2019 in the left leg.  He presents with varicose veins of both lower extremities which seem to be gradually progressing.  He does experience some aching pain and heaviness in his legs which is aggravated by sitting and standing and relieved with elevation.  He does wear knee-high compression stockings.  He is not had any recent bleeding episodes from his varicose veins.  Past Medical History:  Diagnosis Date  . 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 Allergy   . BRADYCARDIA 09/20/2007  . CAD (coronary artery disease)   . CEREBROVASCULAR ACCIDENT, HX OF 09/20/2007  . COLONIC POLYPS, HX OF 12/18/2006  . DIVERTICULOSIS, COLON 09/20/2007  . HYPERLIPIDEMIA 12/18/2006  . PERIPHERAL VASCULAR DISEASE 09/20/2007  . TIA 12/03/2007  . Varicose veins     Family History  Problem Relation Age of Onset  . Cancer Brother   . Colon cancer Brother 27       died age 22-found out 6 months before passing he had colon cancer  . Heart disease Father     SOCIAL HISTORY: Social History   Tobacco Use  . Smoking status: Never Smoker  . Smokeless tobacco: Never Used  Substance Use Topics  . Alcohol use: No    Alcohol/week: 0.0 standard drinks    Allergies  Allergen Reactions  . Niacin     REACTION: hives  . Simvastatin Other (See Comments)    myalgias    Current Outpatient Medications  Medication Sig Dispense Refill  . aspirin 81 MG tablet Take 1 tablet (81 mg total) by mouth daily. 30 tablet 6  . atorvastatin (LIPITOR) 10 MG tablet Take 1 tablet (10 mg total) by mouth daily. 90 tablet 3  . augmented betamethasone dipropionate (DIPROLENE-AF) 0.05 % cream Use as  directed    . BEE POLLEN PO Take by mouth daily.     Marland Kitchen doxycycline (VIBRAMYCIN) 100 MG capsule Take 100 mg by mouth 2 (two) times daily.    . Multiple Vitamin (MULTIVITAMIN) tablet Take 1 tablet by mouth daily. Geritol- takes 5 days a week    . Red Yeast Rice Extract (RED YEAST RICE PO) Take by mouth.    . Saw Palmetto, Serenoa repens, (SAW PALMETTO PO) Take 1 tablet by mouth daily.    Marland Kitchen triamcinolone cream (KENALOG) 0.1 % Use as directed    . vitamin C (ASCORBIC ACID) 500 MG tablet Take 500 mg by mouth daily. 5 days a week    . Vitamin D, Ergocalciferol, (DRISDOL) 1.25 MG (50000 UT) CAPS capsule Take 1 capsule (50,000 Units total) by mouth every 7 (seven) days. 12 capsule 0   No current facility-administered medications for this visit.     REVIEW OF SYSTEMS:  [X]  denotes positive finding, [ ]  denotes negative finding Cardiac  Comments:  Chest pain or chest pressure:    Shortness of breath upon exertion:    Short of breath when lying flat:    Irregular heart rhythm:        Vascular    Pain in calf, thigh, or hip brought on by ambulation:    Pain in feet at night that wakes you  up from your sleep:     Blood clot in your veins:    Leg swelling:  x       Pulmonary    Oxygen at home:    Productive cough:     Wheezing:         Neurologic    Sudden weakness in arms or legs:     Sudden numbness in arms or legs:     Sudden onset of difficulty speaking or slurred speech:    Temporary loss of vision in one eye:     Problems with dizziness:         Gastrointestinal    Blood in stool:     Vomited blood:         Genitourinary    Burning when urinating:     Blood in urine:        Psychiatric    Major depression:         Hematologic    Bleeding problems:    Problems with blood clotting too easily:        Skin    Rashes or ulcers:        Constitutional    Fever or chills:     PHYSICAL EXAM:   Vitals:   11/22/18 1600 11/22/18 1603  BP: (!) 173/82 (!) 168/81  Pulse: (!)  57 (!) 57  Resp: 18   Temp: 97.9 F (36.6 C)   TempSrc: Temporal   SpO2: 100%   Weight: 178 lb (80.7 kg)   Height: 5\' 10"  (1.778 m)     GENERAL: The patient is a well-nourished male, in no acute distress. The vital signs are documented above. CARDIAC: There is a regular rate and rhythm.  VASCULAR: I do not detect carotid bruits. He has palpable pedal pulses. He has extensive varicose veins bilaterally as documented below.        PULMONARY: There is good air exchange bilaterally without wheezing or rales. ABDOMEN: Soft and non-tender with normal pitched bowel sounds.  MUSCULOSKELETAL: There are no major deformities or cyanosis. NEUROLOGIC: No focal weakness or paresthesias are detected. SKIN: There are no ulcers or rashes noted. PSYCHIATRIC: The patient has a normal affect.  DATA:    VENOUS DUPLEX: I have independently interpreted his venous duplex scan.    On the right side there is no evidence of DVT.  His right great saphenous vein is previously been ablated.  He has deep venous reflux involving the common femoral vein, femoral vein, and popliteal vein.  On the left side there is no evidence of DVT.  There is deep venous reflux involving the common femoral vein and femoral vein.  The left great saphenous vein has been ablated.  MEDICAL ISSUES:   VARICOSE VEINS BILATERALLY: Patient has varicose veins bilaterally.  He has had previous ablation of both great saphenous veins.  He has significant deep venous reflux which likely explains the progression of his venous disease.  However I have explained that we cannot treat his deep venous reflux.  We have discussed conservative treatment including the importance of leg elevation and the proper positioning for this.  I encouraged him to wear his knee-high compression stockings.  We discussed the importance of exercise.  I encouraged him to avoid prolonged sitting and standing.  I explained that if his symptoms progress or he has any  bleeding problems from his varicose veins to let us know and we could consider stab phlebectomies.  Deitra Mayo [facility]  [employer] [phone]

## 2018-11-26 ENCOUNTER — Encounter: Payer: Self-pay | Admitting: Vascular Surgery

## 2019-01-25 ENCOUNTER — Ambulatory Visit: Payer: PPO | Attending: Internal Medicine

## 2019-01-25 DIAGNOSIS — Z20822 Contact with and (suspected) exposure to covid-19: Secondary | ICD-10-CM | POA: Diagnosis not present

## 2019-01-27 LAB — NOVEL CORONAVIRUS, NAA: SARS-CoV-2, NAA: DETECTED — AB

## 2019-01-28 ENCOUNTER — Telehealth: Payer: Self-pay | Admitting: Nurse Practitioner

## 2019-01-28 NOTE — Telephone Encounter (Signed)
Called to Discuss with patient about Covid symptoms and the use of bamlanivimab, a monoclonal antibody infusion for those with mild to moderate Covid symptoms and at a high risk of hospitalization.     Pt is qualified for this infusion at the [facility] center due to co-morbid conditions and/or a member of an at-risk group.     Unable to reach pt  

## 2019-01-29 ENCOUNTER — Ambulatory Visit: Payer: Self-pay | Admitting: *Deleted

## 2019-01-29 NOTE — Telephone Encounter (Signed)
Patient notified of + COVID test result. Questioned about symptoms, instructed in cleansing and quarantine and isolation practices, and dates with criteria to end each. Instructed when to seek medical care at Urgent Care/ER, masking if leave isolation. Educated on why re-testing is not necessary. Pt to notify PCP incase further follow up needed and informed that the [facility] would be notified. Questions answered, pt verbalized understanding of instructions.   Reason for Disposition . COVID-19 Testing, questions about  Answer Assessment - Initial Assessment Questions 1. COVID-19 DIAGNOSIS: "Who made your Coronavirus (COVID-19) diagnosis?" "Was it confirmed by a positive lab test?" If not diagnosed by a HCP, ask "Are there lots of cases (community spread) where you live?" (See public health department website, if unsure)     Yes, [place] 2. COVID-19 EXPOSURE: "Was there any known exposure to [place] before the symptoms began?" CDC Definition of close contact: within 6 feet (2 meters) for a total of 15 minutes or more over a 24-hour period.      Yes 1/1 3. ONSET: "When did the COVID-19 symptoms start?"      No sx 4. WORST SYMPTOM: "What is your worst symptom?" (e.g., cough, fever, shortness of breath, muscle aches)     none 5. COUGH: "Do you have a cough?" If so, ask: "How bad is the cough?"       none 6. FEVER: "Do you have a fever?" If so, ask: "What is your temperature, how was it measured, and when did it start?"     none 7. RESPIRATORY STATUS: "Describe your breathing?" (e.g., shortness of breath, wheezing, unable to speak)      none 8. BETTER-SAME-WORSE: "Are you getting better, staying the same or getting worse compared to yesterday?"  If getting worse, ask, "In what way?"     na 9. HIGH RISK DISEASE: "Do you have any chronic medical problems?" (e.g., asthma, heart or lung disease, weak immune system, obesity, etc.)     no 10. PREGNANCY: "Is there any chance you  are pregnant?" "When was your last menstrual period?"       na 11. OTHER SYMPTOMS: "Do you have any other symptoms?"  (e.g., chills, fatigue, headache, loss of smell or taste, muscle pain, sore throat; new loss of smell or taste especially support the diagnosis of COVID-19)       none  Protocols used: CORONAVIRUS (COVID-19) DIAGNOSED OR SUSPECTED-A-AH

## 2019-03-25 DIAGNOSIS — H04123 Dry eye syndrome of bilateral lacrimal glands: Secondary | ICD-10-CM | POA: Diagnosis not present

## 2019-03-25 DIAGNOSIS — H02839 Dermatochalasis of unspecified eye, unspecified eyelid: Secondary | ICD-10-CM | POA: Diagnosis not present

## 2019-03-25 DIAGNOSIS — Z961 Presence of intraocular lens: Secondary | ICD-10-CM | POA: Diagnosis not present

## 2019-07-17 DIAGNOSIS — L570 Actinic keratosis: Secondary | ICD-10-CM | POA: Diagnosis not present

## 2019-07-17 DIAGNOSIS — L308 Other specified dermatitis: Secondary | ICD-10-CM | POA: Diagnosis not present

## 2019-07-17 DIAGNOSIS — L820 Inflamed seborrheic keratosis: Secondary | ICD-10-CM | POA: Diagnosis not present

## 2019-07-17 DIAGNOSIS — X32XXXD Exposure to sunlight, subsequent encounter: Secondary | ICD-10-CM | POA: Diagnosis not present

## 2019-09-02 ENCOUNTER — Encounter: Payer: Self-pay | Admitting: Cardiovascular Disease

## 2019-09-02 ENCOUNTER — Other Ambulatory Visit: Payer: Self-pay

## 2019-09-02 ENCOUNTER — Ambulatory Visit: Payer: PPO | Admitting: Cardiovascular Disease

## 2019-09-02 VITALS — BP 188/90 | HR 61 | Ht 70.0 in | Wt 186.6 lb

## 2019-09-02 DIAGNOSIS — I10 Essential (primary) hypertension: Secondary | ICD-10-CM

## 2019-09-02 DIAGNOSIS — E785 Hyperlipidemia, unspecified: Secondary | ICD-10-CM | POA: Diagnosis not present

## 2019-09-02 DIAGNOSIS — I2510 Atherosclerotic heart disease of native coronary artery without angina pectoris: Secondary | ICD-10-CM | POA: Diagnosis not present

## 2019-09-02 NOTE — Progress Notes (Signed)
Chief Complaint  Patient presents with  . Follow-up    CAD   History of Present Illness: 79 yo male with history of CAD s/p 3V CABG 02/10/12 (LIMA to LAD, RIMA to RCA, free radial to OM), hyperlipidemia, prostate cancer, CVA here today for cardiac follow up. He presented with unstable angina in January 2014. Cardiac cath 02/10/12 with 99% ostial left main stenosis, 99% ostial RCA stenosis. 3V CABG on 02/10/12. He has done well following his CABG. He has not tolerated statins. Echo April 2018 with normal LV function, mild AI, mild MR.   He is here today for follow up. The patient denies any chest pain, dyspnea, palpitations, lower extremity edema, orthopnea, PND, dizziness, near syncope or syncope.   Primary Care Physician: Biagio Borg, MD  Past Medical History:  Diagnosis Date  . 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 Allergy   . BRADYCARDIA 09/20/2007  . CAD (coronary artery disease)   . CEREBROVASCULAR ACCIDENT, HX OF 09/20/2007  . COLONIC POLYPS, HX OF 12/18/2006  . DIVERTICULOSIS, COLON 09/20/2007  . HYPERLIPIDEMIA 12/18/2006  . PERIPHERAL VASCULAR DISEASE 09/20/2007  . TIA 12/03/2007  . Varicose veins     Past Surgical History:  Procedure Laterality Date  . COLONOSCOPY  06-05-2009   last colon 06-05-2009 w/ patterson-polyps  . CORONARY ARTERY BYPASS GRAFT  02/10/2012   Procedure: CORONARY ARTERY BYPASS GRAFTING (CABG);  Surgeon: Ivin Poot, MD;  Location: [place];  Service: Open Heart Surgery;  Laterality: N/A;  CABG x three, using Bilateral mammary artery harvest and left arm radial harvest  . INTRAOPERATIVE TRANSESOPHAGEAL ECHOCARDIOGRAM  02/10/2012   Procedure: INTRAOPERATIVE TRANSESOPHAGEAL ECHOCARDIOGRAM;  Surgeon: Ivin Poot, MD;  Location: [place];  Service: Open Heart Surgery;  Laterality: N/A;  . LEFT HEART CATHETERIZATION WITH CORONARY ANGIOGRAM N/A 02/10/2012   Procedure: LEFT HEART CATHETERIZATION WITH CORONARY ANGIOGRAM;   Surgeon: Burnell Blanks, MD;  Location: [facility] CATH LAB;  Service: Cardiovascular;  Laterality: N/A;  . POLYPECTOMY     hx TA colon polyps  . RADIAL ARTERY HARVEST  02/10/2012   Procedure: RADIAL ARTERY HARVEST;  Surgeon: Ivin Poot, MD;  Location: [place];  Service: Open Heart Surgery;  Laterality: Left;    Current Outpatient Medications  Medication Sig Dispense Refill  . aspirin EC 81 MG tablet Take 81 mg by mouth once a week. Swallow whole.    Marland Kitchen atorvastatin (LIPITOR) 10 MG tablet Take 1 tablet (10 mg total) by mouth daily. 90 tablet 3  . augmented betamethasone dipropionate (DIPROLENE-AF) 0.05 % cream Use as directed    . BEE POLLEN PO Take by mouth daily.     Marland Kitchen doxycycline (VIBRAMYCIN) 100 MG capsule Take 100 mg by mouth 2 (two) times daily.    . Multiple Vitamin (MULTIVITAMIN) tablet Take 1 tablet by mouth daily. Geritol- takes 5 days a week    . Red Yeast Rice Extract (RED YEAST RICE PO) Take by mouth.    . Saw Palmetto, Serenoa repens, (SAW PALMETTO PO) Take 1 tablet by mouth daily.    Marland Kitchen triamcinolone cream (KENALOG) 0.1 % Use as directed    . vitamin C (ASCORBIC ACID) 500 MG tablet Take 500 mg by mouth daily. 5 days a week    . Vitamin D, Ergocalciferol, (DRISDOL) 1.25 MG (50000 UT) CAPS capsule Take 1 capsule (50,000 Units total) by mouth every 7 (seven) days. 12 capsule 0   No current facility-administered  medications for this visit.    Allergies  Allergen Reactions  . Niacin     REACTION: hives  . Simvastatin Other (See Comments)    myalgias    Social History   Socioeconomic History  . Marital status: Married    Spouse name: Not on file  . Number of children: Not on file  . Years of education: Not on file  . Highest education level: Not on file  Occupational History  . Not on file  Tobacco Use  . Smoking status: Never Smoker  . Smokeless tobacco: Never Used  Substance and Sexual Activity  . Alcohol use: No    Alcohol/week: 0.0 standard drinks  . Drug  use: No  . Sexual activity: Not on file  Other Topics Concern  . Not on file  Social History Narrative  . Not on file   Social Determinants of Health   Financial Resource Strain:   . Difficulty of Paying Living Expenses:   Food Insecurity:   . Worried About Charity fundraiser in the Last Year:   . Arboriculturist in the Last Year:   Transportation Needs:   . Film/video editor (Medical):   Marland Kitchen Lack of Transportation (Non-Medical):   Physical Activity:   . Days of Exercise per Week:   . Minutes of Exercise per Session:   Stress:   . Feeling of Stress :   Social Connections:   . Frequency of Communication with Friends and Family:   . Frequency of Social Gatherings with Friends and Family:   . Attends Religious Services:   . Active Member of Clubs or Organizations:   . Attends Archivist Meetings:   Marland Kitchen Marital Status:   Intimate Partner Violence:   . Fear of Current or Ex-Partner:   . Emotionally Abused:   Marland Kitchen Physically Abused:   . Sexually Abused:     Family History  Problem Relation Age of Onset  . Cancer Brother   . Colon cancer Brother 70       died age 50-found out 6 months before passing he had colon cancer  . Heart disease Father     Review of Systems:  As stated in the HPI and otherwise negative.   BP (!) 188/90   Pulse 61   Ht 5\' 10"  (1.778 m)   Wt 186 lb 9.6 oz (84.6 kg)   SpO2 97%   BMI 26.77 kg/m   Physical Examination: General: Well developed, well nourished, NAD  HEENT: OP clear, mucus membranes moist  SKIN: warm, dry. No rashes. Neuro: No focal deficits  Musculoskeletal: Muscle strength 5/5 all ext  Psychiatric: Mood and affect normal  Neck: No JVD, no carotid bruits, no thyromegaly, no lymphadenopathy.  Lungs:Clear bilaterally, no wheezes, rhonci, crackles Cardiovascular: Regular rate and rhythm. No murmurs, gallops or rubs. Abdomen:Soft. Bowel sounds present. Non-tender.  Extremities: No lower extremity edema. Pulses are 2 + in  the bilateral DP/PT.  Echo 05/16/16: - Left ventricle: The cavity size was normal. There was mild focal   basal hypertrophy of the septum. Systolic function was normal.   The estimated ejection fraction was in the range of 55% to 60%.   Wall motion was normal; there were no regional wall motion   abnormalities. Features are consistent with a pseudonormal left   ventricular filling pattern, with concomitant abnormal relaxation   and increased filling pressure (grade 2 diastolic dysfunction). - Aortic valve: There was mild regurgitation. - Aortic root: The aortic  root was mildly dilated. - Mitral valve: There was mild regurgitation. - Left atrium: The atrium was mildly dilated. - Pulmonary arteries: Systolic pressure was mildly increased. PA   peak pressure: 36 mm Hg (S).  Impressions:  - Normal LV systolic function; moderate diastolic dysfunction;   sclerotic aortic valve with mild AI; mildly dilated aortic root;   mild MR; mild LAE; mild TR with mildly elevated pulmonary   pressure.  Cardiac cath 02/10/12:  Left main: Distal 99% stenosis.  Left Anterior Descending Artery: Large caliber vessel that courses to the apex. There is calcification in the proximal vessel. The proximal vessel has a 90% stenosis. The mid vessel has serial 50% stenoses. The distal vessel has mild plaque disease. The diagonal branch is small to moderate sized and has mild plaque disease.  Circumflex Artery: Large caliber, calcified vessel with 30% stenosis in the proximal vessel. The obtuse marginal is moderate in caliber and has 30% serial stenoses.  Right Coronary Artery: Large, dominant vessel with 99% ostial stenosis. The mid vessel has serial 30% stenoses with moderate calcification. The distal vessel has a 40-50% stenosis just before the bifurcation. The PDA and posterolateral branches are moderate in caliber with diffuse plaque but no focally obstructive lesions.  Left Ventricular Angiogram: LVEF=60-65%.    EKG:  EKG is ordered today. The ekg ordered today demonstrates Sinus  Recent Labs: 11/15/2018: ALT 17; BUN 13; Creatinine, Ser 1.03; Hemoglobin 16.7; Platelets 231.0; Potassium 4.5; Sodium 136; TSH 2.49   Lipid Panel    Component Value Date/Time   CHOL 254 (H) 11/15/2018 0851   TRIG 106.0 11/15/2018 0851   TRIG 61 12/16/2005 0753   HDL 55.90 11/15/2018 0851   CHOLHDL 5 11/15/2018 0851   VLDL 21.2 11/15/2018 0851   LDLCALC 177 (H) 11/15/2018 0851   LDLDIRECT 179.4 12/23/2010 0935     Wt Readings from Last 3 Encounters:  09/02/19 186 lb 9.6 oz (84.6 kg)  11/22/18 178 lb (80.7 kg)  11/15/18 180 lb (81.6 kg)     Other studies Reviewed: Additional studies/ records that were reviewed today include: . Review of the above records demonstrates:    Assessment and Plan:   1. CAD without angina: He has no chest pain. LV function normal by echo in 2018. He is on ASA but has not tolerated beta blockers due to bradycardia. Continue ASA and low dose statin  2. Hyperlipidemia: Continue statin. LDL above goal in October 2020. I have discussed referral to the lipid clinic to consider Praluent or Repatha. He wishes to repeat his lipids first. I do not think he will get to the goal LDL of 70 on low dose statin alone. He does not tolerate higher doses of statins. Will finalize plan after his fasting lipids return.   3. HTN: BP is controlled at home in the 130s and [facility]. 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 No changes  4. Lower extremity edema: This is consistent with dependent edema.   Current medicines are reviewed at length with the patient today.  The patient does not have concerns regarding medicines.  The following changes have been made:  no change  Labs/ tests ordered today include:   Orders Placed This Encounter  Procedures  . Lipid panel  . Hepatic function panel  . EKG 12-Lead     Disposition:   FU with me in 12  months   Signed, Lauree Chandler,  MD 09/02/2019 4:46 PM    [facility]  [address], [place], [place]  [address] Phone: [phone]; Fax: [phone]

## 2019-09-02 NOTE — Patient Instructions (Signed)
Medication Instructions:  Your provider recommends that you continue on your current medications as directed. Please refer to the Current Medication list given to you today.   *If you need a refill on your cardiac medications before your next appointment, please call your pharmacy*  Lab Work: Your provider recommends that you return for FASTING lab work. You may come ANY TIME between 7:30AM and 4:30PM on the day of your appointment.   If you have labs (blood work) drawn today and your tests are completely normal, you will receive your results only by: Marland Kitchen MyChart Message (if you have MyChart) OR . A paper copy in the mail If you have any lab test that is abnormal or we need to change your treatment, we will call you to review the results.  Follow-Up: At [facility], you and your health needs are our priority.  As part of our continuing mission to provide you with exceptional heart care, we have created designated Provider Care Teams.  These Care Teams include your primary Cardiologist (physician) and Advanced Practice Providers (APPs -  Physician Assistants and Nurse Practitioners) who all work together to provide you with the care you need, when you need it. Your next appointment:   12 month(s) The format for your next appointment:   In Person Provider:   You may see Lauree Chandler, MD or one of the following Advanced Practice Providers on your designated Care Team:    Melina Copa, PA-C  Ermalinda Barrios, PA-C

## 2019-09-06 ENCOUNTER — Other Ambulatory Visit: Payer: PPO | Admitting: *Deleted

## 2019-09-06 ENCOUNTER — Other Ambulatory Visit: Payer: Self-pay

## 2019-09-06 DIAGNOSIS — I2510 Atherosclerotic heart disease of native coronary artery without angina pectoris: Secondary | ICD-10-CM

## 2019-09-06 DIAGNOSIS — E785 Hyperlipidemia, unspecified: Secondary | ICD-10-CM

## 2019-09-06 LAB — HEPATIC FUNCTION PANEL
ALT: 16 IU/L (ref 0–44)
AST: 24 IU/L (ref 0–40)
Albumin: 4.2 g/dL (ref 3.7–4.7)
Alkaline Phosphatase: 61 IU/L (ref 48–121)
Bilirubin Total: 0.5 mg/dL (ref 0.0–1.2)
Bilirubin, Direct: 0.15 mg/dL (ref 0.00–0.40)
Total Protein: 6.4 g/dL (ref 6.0–8.5)

## 2019-09-06 LAB — LIPID PANEL
Chol/HDL Ratio: 3 ratio (ref 0.0–5.0)
Cholesterol, Total: 173 mg/dL (ref 100–199)
HDL: 57 mg/dL (ref >39)
LDL Chol Calc (NIH): 106 mg/dL — ABNORMAL HIGH (ref 0–99)
Triglycerides: 52 mg/dL (ref 0–149)
VLDL Cholesterol Cal: 10 mg/dL (ref 5–40)

## 2019-09-12 ENCOUNTER — Telehealth: Payer: Self-pay | Admitting: Cardiovascular Disease

## 2019-09-12 ENCOUNTER — Encounter: Payer: Self-pay | Admitting: Cardiology

## 2019-09-12 DIAGNOSIS — E785 Hyperlipidemia, unspecified: Secondary | ICD-10-CM

## 2019-09-12 NOTE — Telephone Encounter (Signed)
Did not lv message on work number.  It was voice mail for a company.  Called and left another vm on mobile number.

## 2019-09-12 NOTE — Telephone Encounter (Signed)
error 

## 2019-09-12 NOTE — Telephone Encounter (Signed)
    Pt is returning call from [facility] to get result he said before 5 he can be reach at his work number. After 5 can be reach to his mobile number

## 2019-09-16 NOTE — Telephone Encounter (Signed)
Patient returning call. He states to call after 2:00 pm when he is back from lunch.

## 2019-09-16 NOTE — Telephone Encounter (Signed)
Left message for patient to call back  

## 2019-09-18 MED ORDER — EZETIMIBE 10 MG PO TABS: 10.0000 mg | ORAL_TABLET | Freq: Every day | ORAL | 3 refills | Status: DC

## 2019-09-18 NOTE — Telephone Encounter (Signed)
Spoke with patient.  He feels he is not a good candidate for shots.  He is very pleased with the decrease in his LDL and thought his goal is <100.  We discussed risk factors the recommendation from Dr. Angelena Form that his LDL goal is <70.  He is in agreement to begin zetia daily.  Will return in 3 months to repeat lipids.

## 2019-12-25 ENCOUNTER — Other Ambulatory Visit: Payer: PPO

## 2020-02-06 DIAGNOSIS — N5201 Erectile dysfunction due to arterial insufficiency: Secondary | ICD-10-CM | POA: Diagnosis not present

## 2020-02-06 DIAGNOSIS — N400 Enlarged prostate without lower urinary tract symptoms: Secondary | ICD-10-CM | POA: Diagnosis not present

## 2020-02-06 DIAGNOSIS — C61 Malignant neoplasm of prostate: Secondary | ICD-10-CM | POA: Diagnosis not present

## 2020-02-27 ENCOUNTER — Encounter: Payer: Self-pay | Admitting: Gastroenterology

## 2020-04-06 DIAGNOSIS — H26491 Other secondary cataract, right eye: Secondary | ICD-10-CM | POA: Diagnosis not present

## 2020-04-06 DIAGNOSIS — H04123 Dry eye syndrome of bilateral lacrimal glands: Secondary | ICD-10-CM | POA: Diagnosis not present

## 2020-04-06 DIAGNOSIS — H02839 Dermatochalasis of unspecified eye, unspecified eyelid: Secondary | ICD-10-CM | POA: Diagnosis not present

## 2020-04-21 DIAGNOSIS — Z961 Presence of intraocular lens: Secondary | ICD-10-CM | POA: Diagnosis not present

## 2020-06-10 DIAGNOSIS — L820 Inflamed seborrheic keratosis: Secondary | ICD-10-CM | POA: Diagnosis not present

## 2020-06-10 DIAGNOSIS — X32XXXD Exposure to sunlight, subsequent encounter: Secondary | ICD-10-CM | POA: Diagnosis not present

## 2020-06-10 DIAGNOSIS — L570 Actinic keratosis: Secondary | ICD-10-CM | POA: Diagnosis not present

## 2020-06-10 DIAGNOSIS — L508 Other urticaria: Secondary | ICD-10-CM | POA: Diagnosis not present

## 2020-08-12 ENCOUNTER — Encounter: Payer: Self-pay | Admitting: Gastroenterology

## 2020-10-14 ENCOUNTER — Ambulatory Visit: Payer: PPO | Admitting: Gastroenterology

## 2020-10-14 ENCOUNTER — Encounter: Payer: Self-pay | Admitting: Gastroenterology

## 2020-10-14 DIAGNOSIS — Z800 Family history of malignant neoplasm of digestive organs: Secondary | ICD-10-CM | POA: Diagnosis not present

## 2020-10-14 DIAGNOSIS — Z86010 Personal history of colonic polyps: Secondary | ICD-10-CM | POA: Diagnosis not present

## 2020-10-14 MED ORDER — NA SULFATE-K SULFATE-MG SULF 17.5-3.13-1.6 GM/177ML PO SOLN: 1.0000 | ORAL | 0 refills | Status: DC

## 2020-10-14 NOTE — Patient Instructions (Signed)
If you are age 80 or older, your body mass index should be between 23-30. Your Body mass index is 26.4 kg/m. If this is out of the aforementioned range listed, please consider follow up with your Primary Care Provider. __________________________________________________________  The [facility] providers would like to encourage you to use [facility] to communicate with providers for non-urgent requests or questions.  Due to long hold times on the telephone, sending your provider a message by [facility] may be a faster and more efficient way to get a response.  Please allow 48 business hours for a response.  Please remember that this is for non-urgent requests.   You have been scheduled for a colonoscopy. Please follow written instructions given to you at your visit today.  Please pick up your prep supplies at the pharmacy within the next 1-3 days. If you use inhalers (even only as needed), please bring them with you on the day of your procedure.  Due to recent changes in healthcare laws, you may see the results of your imaging and laboratory studies on MyChart before your provider has had a chance to review them.  We understand that in some cases there may be results that are confusing or concerning to you. Not all laboratory results come back in the same time frame and the provider may be waiting for multiple results in order to interpret others.  Please give [place] 48 hours in order for your provider to thoroughly review all the results before contacting the office for clarification of your results.   Thank you for entrusting me with your care and choosing [facility].  Dr Ardis Hughs

## 2020-10-14 NOTE — Progress Notes (Addendum)
Review of pertinent gastrointestinal problems: 1.  Increased risk for colon cancer.  Family history of colon cancer, his brother died of colon cancer in his 20s.  Colonoscopy Dr. Verl Blalock 2011 several adenomatous polyps.  Colonoscopy Dr. Ardis Hughs November 2016 was completely normal.   HPI: This is a very pleasant 80 year old man whom I last saw at the time of a colonoscopy November 2016.  See the results summarized above.  Since then he has had really no GI issues.  No bleeding, no serious constipation or diarrhea, no significant abdominal pains.  His weight is overall stable  He is in generally good health, he mows his lawn.  He helps with his wife.   ROS: complete GI ROS as described in HPI, all other review negative.  Constitutional:  No unintentional weight loss   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   no evidence of prostate cancer per pt 11-13-14   Allergy    BRADYCARDIA 09/20/2007   CAD (coronary artery disease)    CEREBROVASCULAR ACCIDENT, HX OF 09/20/2007   COLONIC POLYPS, HX OF 12/18/2006   DIVERTICULOSIS, COLON 09/20/2007   HYPERLIPIDEMIA 12/18/2006   PERIPHERAL VASCULAR DISEASE 09/20/2007   TIA 12/03/2007   Varicose veins     Past Surgical History:  Procedure Laterality Date   COLONOSCOPY  06-05-2009   last colon 06-05-2009 w/ patterson-polyps   CORONARY ARTERY BYPASS GRAFT  02/10/2012   Procedure: CORONARY ARTERY BYPASS GRAFTING (CABG);  Surgeon: Ivin Poot, MD;  Location: [place];  Service: Open Heart Surgery;  Laterality: N/A;  CABG x three, using Bilateral mammary artery harvest and left arm radial harvest   INTRAOPERATIVE TRANSESOPHAGEAL ECHOCARDIOGRAM  02/10/2012   Procedure: INTRAOPERATIVE TRANSESOPHAGEAL ECHOCARDIOGRAM;  Surgeon: Ivin Poot, MD;  Location: [place];  Service: Open Heart Surgery;  Laterality: N/A;   LEFT HEART CATHETERIZATION WITH CORONARY ANGIOGRAM N/A 02/10/2012   Procedure: LEFT HEART CATHETERIZATION WITH  CORONARY ANGIOGRAM;  Surgeon: Burnell Blanks, MD;  Location: [facility] CATH LAB;  Service: Cardiovascular;  Laterality: N/A;   POLYPECTOMY     hx TA colon polyps   RADIAL ARTERY HARVEST  02/10/2012   Procedure: RADIAL ARTERY HARVEST;  Surgeon: Ivin Poot, MD;  Location: [place];  Service: Open Heart Surgery;  Laterality: Left;    Current Outpatient Medications  Medication Sig Dispense Refill   aspirin EC 81 MG tablet Take 81 mg by mouth once a week. Swallow whole.     atorvastatin (LIPITOR) 10 MG tablet Take 1 tablet (10 mg total) by mouth daily. 90 tablet 3   augmented betamethasone dipropionate (DIPROLENE-AF) 0.05 % cream Use as directed     BEE POLLEN PO Take by mouth daily.      doxycycline (VIBRAMYCIN) 100 MG capsule Take 100 mg by mouth 2 (two) times daily.     ezetimibe (ZETIA) 10 MG tablet Take 1 tablet (10 mg total) by mouth daily. 90 tablet 3   Multiple Vitamin (MULTIVITAMIN) tablet Take 1 tablet by mouth daily. Geritol- takes 5 days a week     Red Yeast Rice Extract (RED YEAST RICE PO) Take by mouth.     Saw Palmetto, Serenoa repens, (SAW PALMETTO PO) Take 1 tablet by mouth daily.     triamcinolone cream (KENALOG) 0.1 % Use as directed     vitamin C (ASCORBIC ACID) 500 MG tablet Take 500 mg by mouth daily. 5 days a week     Vitamin D, Ergocalciferol, (DRISDOL) 1.25 MG (  50000 UT) CAPS capsule Take 1 capsule (50,000 Units total) by mouth every 7 (seven) days. 12 capsule 0   No current facility-administered medications for this visit.    Allergies as of 10/14/2020 - Review Complete 10/14/2020  Allergen Reaction Noted   Niacin  05/22/2009   Simvastatin Other (See Comments) 12/23/2010    Family History  Problem Relation Age of Onset   Cancer Brother    Colon cancer Brother 82       died age 80-found out 6 months before passing he had colon cancer   Heart disease Father     Social History   Socioeconomic History   Marital status: Married    Spouse name: Not on file    Number of children: Not on file   Years of education: Not on file   Highest education level: Not on file  Occupational History   Not on file  Tobacco Use   Smoking status: Never   Smokeless tobacco: Never  Substance and Sexual Activity   Alcohol use: No    Alcohol/week: 0.0 standard drinks   Drug use: No   Sexual activity: Not on file  Other Topics Concern   Not on file  Social History Narrative   Not on file   Social Determinants of Health   Financial Resource Strain: Not on file  Food Insecurity: Not on file  Transportation Needs: Not on file  Physical Activity: Not on file  Stress: Not on file  Social Connections: Not on file  Intimate Partner Violence: Not on file     Physical Exam: Ht 5\' 10"  (1.778 m)   Wt 184 lb (83.5 kg)   BMI 26.40 kg/m  Constitutional: generally well-appearing Psychiatric: alert and oriented x3 Abdomen: soft, nontender, nondistended, no obvious ascites, no peritoneal signs, normal bowel sounds No peripheral edema noted in lower extremities  Assessment and plan: 80 y.o. male with increased risk for colon cancer  He has a family history of colon cancer personal history of precancerous colon polyps.  He is 62 but is in generally very good health and so we discussed repeat colonoscopy for increased risk of colon cancer.  I see no reason for any further blood tests or imaging studies prior to then.  Please see the "Patient Instructions" section for addition details about the plan.  Owens Loffler, MD [facility] 10/14/2020, 8:31 AM   Total time on date of encounter was 35 minutes (this included time spent preparing to see the patient reviewing records; obtaining and/or reviewing separately obtained history; performing a medically appropriate exam and/or evaluation; counseling and educating the patient and family if present; ordering medications, tests or procedures if applicable; and documenting clinical information in the health  record).

## 2020-11-03 ENCOUNTER — Ambulatory Visit (AMBULATORY_SURGERY_CENTER): Payer: PPO | Admitting: Gastroenterology

## 2020-11-03 ENCOUNTER — Encounter: Payer: Self-pay | Admitting: Gastroenterology

## 2020-11-03 VITALS — BP 139/61 | HR 59 | Temp 98.0°F | Resp 9 | Ht 70.0 in | Wt 184.0 lb

## 2020-11-03 DIAGNOSIS — Z800 Family history of malignant neoplasm of digestive organs: Secondary | ICD-10-CM | POA: Diagnosis not present

## 2020-11-03 DIAGNOSIS — I2510 Atherosclerotic heart disease of native coronary artery without angina pectoris: Secondary | ICD-10-CM | POA: Diagnosis not present

## 2020-11-03 DIAGNOSIS — Z86010 Personal history of colonic polyps: Secondary | ICD-10-CM | POA: Diagnosis not present

## 2020-11-03 DIAGNOSIS — D123 Benign neoplasm of transverse colon: Secondary | ICD-10-CM

## 2020-11-03 HISTORY — PX: COLONOSCOPY WITH PROPOFOL: SHX5780

## 2020-11-03 MED ORDER — SODIUM CHLORIDE 0.9 % IV SOLN: 500.0000 mL | Freq: Once | INTRAVENOUS | Status: DC

## 2020-11-03 NOTE — Progress Notes (Signed)
  The recent H&P (dated 10/14/2020) was reviewed, the patient was examined and there is no change in the patients condition since that H&P was completed.   Milus Banister  11/03/2020, 1:17 PM

## 2020-11-03 NOTE — Op Note (Signed)
[facility] Patient Name: [name] [name] Procedure Date: 11/03/2020 1:27 PM MRN: [id number] Endoscopist: Milus Banister , MD Age: 80 Referring MD:  Date of Birth: 1940/01/24 Gender: Male Account #: [id number] Procedure:                Colonoscopy Indications:              Screening in patient at increased risk: Family                            history of colon cancer, his brother died of colon                            cancer in his 58s. Colonoscopy Dr. Verl Blalock                            2011 several adenomatous polyps. Colonoscopy Dr.                            Ardis Hughs November 2016 was completely normal Medicines:                Monitored Anesthesia Care Procedure:                Pre-Anesthesia Assessment:                           - Prior to the procedure, a History and Physical                            was performed, and patient medications and                            allergies were reviewed. The patient's tolerance of                            previous anesthesia was also reviewed. The risks                            and benefits of the procedure and the sedation                            options and risks were discussed with the patient.                            All questions were answered, and informed consent                            was obtained. Prior Anticoagulants: The patient has                            taken no previous anticoagulant or antiplatelet                            agents. ASA Grade Assessment: III - A patient with  severe systemic disease. After reviewing the risks                            and benefits, the patient was deemed in                            satisfactory condition to undergo the procedure.                           After obtaining informed consent, the colonoscope                            was passed under direct vision. Throughout the                            procedure, the  patient's blood pressure, pulse, and                            oxygen saturations were monitored continuously. The                            Olympus CF-HQ190L [id number]) Colonoscope was                            introduced through the anus and advanced to the the                            cecum, identified by appendiceal orifice and                            ileocecal valve. The colonoscopy was performed                            without difficulty. The patient tolerated the                            procedure well. The quality of the bowel                            preparation was good. The ileocecal valve,                            appendiceal orifice, and rectum were photographed. Scope In: 1:30:58 PM Scope Out: 1:43:05 PM Scope Withdrawal Time: 0 hours 6 minutes 19 seconds  Total Procedure Duration: 0 hours 12 minutes 7 seconds  Findings:                 A 5 mm polyp was found in the transverse colon. The                            polyp was sessile. The polyp was removed with a                            cold snare. Resection and retrieval were  complete.                           A few small and large-mouthed diverticula were                            found in the left colon.                           The exam was otherwise without abnormality on                            direct and retroflexion views. Complications:            No immediate complications. Estimated blood loss:                            None. Estimated Blood Loss:     Estimated blood loss: none. Impression:               - One 5 mm polyp in the transverse colon, removed                            with a cold snare. Resected and retrieved.                           - Diverticulosis in the left colon.                           - The examination was otherwise normal on direct                            and retroflexion views. Recommendation:           - Patient has a contact number available for                             emergencies. The signs and symptoms of potential                            delayed complications were discussed with the                            patient. Return to normal activities tomorrow.                            Written discharge instructions were provided to the                            patient.                           - Resume previous diet.                           - Continue present medications.                           -  Await pathology results. Milus Banister, MD 11/03/2020 1:45:55 PM This report has been signed electronically.

## 2020-11-03 NOTE — Progress Notes (Signed)
Pt's states no medical or surgical changes since previsit or office visit.  Vitals DT 

## 2020-11-03 NOTE — Progress Notes (Signed)
Report to PACU, RN, vss, BBS= Clear.  

## 2020-11-03 NOTE — Patient Instructions (Signed)
YOU HAD AN ENDOSCOPIC PROCEDURE TODAY AT THE [facility]:   Refer to the procedure report that was given to you for any specific questions about what was found during the examination.  If the procedure report does not answer your questions, please call your gastroenterologist to clarify.  If you requested that your care partner not be given the details of your procedure findings, then the procedure report has been included in a sealed envelope for you to review at your convenience later. ° °**Handouts given on polyps and diverticulosis** ° °YOU SHOULD EXPECT: Some feelings of bloating in the abdomen. Passage of more gas than usual.  Walking can help get rid of the air that was put into your GI tract during the procedure and reduce the bloating. If you had a lower endoscopy (such as a colonoscopy or flexible sigmoidoscopy) you may notice spotting of blood in your stool or on the toilet paper. If you underwent a bowel prep for your procedure, you may not have a normal bowel movement for a few days. ° °Please Note:  You might notice some irritation and congestion in your nose or some drainage.  This is from the oxygen used during your procedure.  There is no need for concern and it should clear up in a day or so. ° °SYMPTOMS TO REPORT IMMEDIATELY: ° °Following lower endoscopy (colonoscopy or flexible sigmoidoscopy): ° Excessive amounts of blood in the stool ° Significant tenderness or worsening of abdominal pains ° Swelling of the abdomen that is new, acute ° Fever of 100°F or higher ° °For urgent or emergent issues, a gastroenterologist can be reached at any hour by calling [phone]. °Do not use MyChart messaging for urgent concerns.  ° ° °DIET:  We do recommend a small meal at first, but then you may proceed to your regular diet.  Drink plenty of fluids but you should avoid alcoholic beverages for 24 hours. ° °ACTIVITY:  You should plan to take it easy for the rest of today and you should NOT DRIVE  or use heavy machinery until tomorrow (because of the sedation medicines used during the test).   ° °FOLLOW UP: °Our staff will call the number listed on your records 48-72 hours following your procedure to check on you and address any questions or concerns that you may have regarding the information given to you following your procedure. If we do not reach you, we will leave a message.  We will attempt to reach you two times.  During this call, we will ask if you have developed any symptoms of COVID 19. If you develop any symptoms (ie: fever, flu-like symptoms, shortness of breath, cough etc.) before then, please call [phone].  If you test positive for Covid 19 in the 2 weeks post procedure, please call and report this information to us.   ° °If any biopsies were taken you will be contacted by phone or by letter within the next 1-3 weeks.  Please call us at [phone] if you have not heard about the biopsies in 3 weeks.  ° ° °SIGNATURES/CONFIDENTIALITY: °You and/or your care partner have signed paperwork which will be entered into your electronic medical record.  These signatures attest to the fact that that the information above on your After Visit Summary has been reviewed and is understood.  Full responsibility of the confidentiality of this discharge information lies with you and/or your care-partner.  °

## 2020-11-03 NOTE — Progress Notes (Signed)
Called to room to assist during endoscopic procedure.  Patient ID and intended procedure confirmed with present staff. Received instructions for my participation in the procedure from the performing physician.  

## 2020-11-05 ENCOUNTER — Telehealth: Payer: Self-pay | Admitting: *Deleted

## 2020-11-05 ENCOUNTER — Telehealth: Payer: Self-pay

## 2020-11-05 NOTE — Telephone Encounter (Signed)
First follow up call attempt.  LVM. 

## 2020-11-05 NOTE — Telephone Encounter (Signed)
Attempted to reach pt. With follow-up call following endoscopic procedure 11/03/2020.  LM on  pt. Voice mail to call if he has any questions or concerns.

## 2020-11-10 ENCOUNTER — Encounter: Payer: Self-pay | Admitting: Gastroenterology

## 2021-01-05 ENCOUNTER — Encounter: Payer: PPO | Admitting: Internal Medicine

## 2021-01-14 ENCOUNTER — Encounter: Payer: PPO | Admitting: Internal Medicine

## 2021-01-19 DIAGNOSIS — Z961 Presence of intraocular lens: Secondary | ICD-10-CM | POA: Diagnosis not present

## 2021-01-22 ENCOUNTER — Ambulatory Visit (INDEPENDENT_AMBULATORY_CARE_PROVIDER_SITE_OTHER): Payer: PPO | Admitting: Internal Medicine

## 2021-01-22 ENCOUNTER — Encounter: Payer: Self-pay | Admitting: Internal Medicine

## 2021-01-22 ENCOUNTER — Other Ambulatory Visit: Payer: Self-pay

## 2021-01-22 VITALS — BP 190/84 | HR 49 | Temp 97.7°F | Ht 70.0 in | Wt 185.0 lb

## 2021-01-22 DIAGNOSIS — R030 Elevated blood-pressure reading, without diagnosis of hypertension: Secondary | ICD-10-CM

## 2021-01-22 DIAGNOSIS — T466X5A Adverse effect of antihyperlipidemic and antiarteriosclerotic drugs, initial encounter: Secondary | ICD-10-CM

## 2021-01-22 DIAGNOSIS — R739 Hyperglycemia, unspecified: Secondary | ICD-10-CM

## 2021-01-22 DIAGNOSIS — G720 Drug-induced myopathy: Secondary | ICD-10-CM | POA: Diagnosis not present

## 2021-01-22 DIAGNOSIS — E559 Vitamin D deficiency, unspecified: Secondary | ICD-10-CM

## 2021-01-22 DIAGNOSIS — E538 Deficiency of other specified B group vitamins: Secondary | ICD-10-CM

## 2021-01-22 DIAGNOSIS — E7800 Pure hypercholesterolemia, unspecified: Secondary | ICD-10-CM

## 2021-01-22 DIAGNOSIS — Z0000 Encounter for general adult medical examination without abnormal findings: Secondary | ICD-10-CM

## 2021-01-22 NOTE — Progress Notes (Signed)
Patient ID: Randi College, male   DOB: 13-Feb-1940, 81 y.o.   MRN: [id number]         Chief Complaint:: wellness exam and Annual Exam  Low vit d, hld, elevated BP, hx of prostate ca       HPI:  [name] [name] is a 81 y.o. male here for wellness exam; declines covid booster, shingrix, o/w up to date  Still working, mowed his own grass                        Also not taking Vit D.  Declines f/u psa, plans to f/u with urology as planned.  Trhing to follow lower chol diet, declines statin.  BP at home < 140/90.  Has chronic bradycardia no change recently  Pt denies chest pain, increased sob or doe, wheezing, orthopnea, PND, increased LE swelling, palpitations, dizziness or syncope.    Wt Readings from Last 3 Encounters:  01/22/21 185 lb (83.9 kg)  11/03/20 184 lb (83.5 kg)  10/14/20 184 lb (83.5 kg)   BP Readings from Last 3 Encounters:  01/22/21 (!) 190/84  11/03/20 139/61  10/14/20 (!) 186/80   Immunization History  Administered Date(s) Administered   Influenza Split 12/23/2010   Influenza Whole 11/16/2007, 10/23/2009   Influenza, High Dose Seasonal PF 09/23/2014, 12/25/2015, 11/02/2017, 10/31/2018   Influenza-Unspecified 10/17/2013, 10/17/2016, 12/28/2020   PFIZER Comirnaty(Gray Top)Covid-19 Tri-Sucrose Vaccine 11/25/2019, 01/27/2020   Pneumococcal Conjugate-13 03/21/2017   Pneumococcal Polysaccharide-23 09/23/2014   Td 01/18/1996, 01/18/2004, 09/23/2014   There are no preventive care reminders to display for this patient.     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   no evidence of prostate cancer per pt 11-13-14   Allergy    BRADYCARDIA 09/20/2007   CAD (coronary artery disease)    CEREBROVASCULAR ACCIDENT, HX OF 09/20/2007   COLONIC POLYPS, HX OF 12/18/2006   DIVERTICULOSIS, COLON 09/20/2007   HYPERLIPIDEMIA 12/18/2006   PERIPHERAL VASCULAR DISEASE 09/20/2007   TIA 12/03/2007   Varicose veins    Past Surgical History:  Procedure Laterality  Date   COLONOSCOPY  06/05/2009   last colon 06-05-2009 w/ patterson-polyps   COLONOSCOPY WITH PROPOFOL  11/03/2020   CORONARY ARTERY BYPASS GRAFT  02/10/2012   Procedure: CORONARY ARTERY BYPASS GRAFTING (CABG);  Surgeon: Ivin Poot, MD;  Location: [facility];  Service: Open Heart Surgery;  Laterality: N/A;  CABG x three, using Bilateral mammary artery harvest and left arm radial harvest   INTRAOPERATIVE TRANSESOPHAGEAL ECHOCARDIOGRAM  02/10/2012   Procedure: INTRAOPERATIVE TRANSESOPHAGEAL ECHOCARDIOGRAM;  Surgeon: Ivin Poot, MD;  Location: [facility];  Service: Open Heart Surgery;  Laterality: N/A;   LEFT HEART CATHETERIZATION WITH CORONARY ANGIOGRAM N/A 02/10/2012   Procedure: LEFT HEART CATHETERIZATION WITH CORONARY ANGIOGRAM;  Surgeon: Burnell Blanks, MD;  Location: [facility] CATH LAB;  Service: Cardiovascular;  Laterality: N/A;   POLYPECTOMY     hx TA colon polyps   RADIAL ARTERY HARVEST  02/10/2012   Procedure: RADIAL ARTERY HARVEST;  Surgeon: Ivin Poot, MD;  Location: [facility];  Service: Open Heart Surgery;  Laterality: Left;    reports that he has never smoked. He has never used smokeless tobacco. He reports that he does not drink alcohol and does not use drugs. family history includes Cancer in his brother; Colon cancer (age of onset: 80) in his brother; Heart disease in his father. Allergies  Allergen Reactions   Niacin     REACTION:  hives   Simvastatin Other (See Comments)    myalgias   Current Outpatient Medications on File Prior to Visit  Medication Sig Dispense Refill   BEE POLLEN PO Take by mouth daily.     Multiple Vitamin (MULTIVITAMIN) tablet Take 1 tablet by mouth daily. Geritol- takes 5 days a week     Red Yeast Rice Extract (RED YEAST RICE PO) Take by mouth.     Saw Palmetto, Serenoa repens, (SAW PALMETTO PO) Take 1 tablet by mouth daily.     triamcinolone cream (KENALOG) 0.1 % Use as directed     vitamin C (ASCORBIC ACID) 500 MG tablet Take 500 mg by mouth  daily. 5 days a week     Vitamin D, Ergocalciferol, (DRISDOL) 1.25 MG (50000 UT) CAPS capsule Take 1 capsule (50,000 Units total) by mouth every 7 (seven) days. 12 capsule 0   aspirin EC 81 MG tablet Take 81 mg by mouth once a week. Swallow whole. (Patient not taking: Reported on 10/14/2020)     No current facility-administered medications on file prior to visit.        ROS:  All others reviewed and negative.  Objective        PE:  BP (!) 190/84 (BP Location: Right Arm, Patient Position: Sitting, Cuff Size: Large)    Pulse (!) 49    Temp 97.7 F (36.5 C) (Oral)    Ht 5\' 10"  (1.778 m)    Wt 185 lb (83.9 kg)    SpO2 99%    BMI 26.54 kg/m                 Constitutional: Pt appears in NAD               HENT: Head: NCAT.                Right Ear: External ear normal.                 Left Ear: External ear normal.                Eyes: . Pupils are equal, round, and reactive to light. Conjunctivae and EOM are normal               Nose: without d/c or deformity               Neck: Neck supple. Gross normal ROM               Cardiovascular: Normal rate and regular rhythm.                 Pulmonary/Chest: Effort normal and breath sounds without rales or wheezing.                Abd:  Soft, NT, ND, + BS, no organomegaly               Neurological: Pt is alert. At baseline orientation, motor grossly intact               Skin: Skin is warm. No rashes, no other new lesions, LE edema - none               Psychiatric: Pt behavior is normal without agitation   Micro: none  Cardiac tracings I have personally interpreted today:  none  Pertinent Radiological findings (summarize): none   Lab Results  Component Value Date   WBC 6.1 11/15/2018   HGB 16.7 11/15/2018   HCT 48.9 11/15/2018  PLT 231.0 11/15/2018   GLUCOSE 102 (H) 11/15/2018   CHOL 173 09/06/2019   TRIG 52 09/06/2019   HDL 57 09/06/2019   LDLDIRECT 179.4 12/23/2010   LDLCALC 106 (H) 09/06/2019   ALT 16 09/06/2019   AST 24  09/06/2019   NA 136 11/15/2018   K 4.5 11/15/2018   CL 99 11/15/2018   CREATININE 1.03 11/15/2018   BUN 13 11/15/2018   CO2 31 11/15/2018   TSH 2.49 11/15/2018   PSA 12.89 (H) 11/15/2018   INR 1.40 02/11/2012   Assessment/Plan:  [name] [name] is a 81 y.o. White or Caucasian [1] male with  has a past medical history of ADENOCARCINOMA, PROSTATE (09/20/2007), Allergy, BRADYCARDIA (09/20/2007), CAD (coronary artery disease), CEREBROVASCULAR ACCIDENT, HX OF (09/20/2007), COLONIC POLYPS, HX OF (12/18/2006), DIVERTICULOSIS, COLON (09/20/2007), HYPERLIPIDEMIA (12/18/2006), PERIPHERAL VASCULAR DISEASE (09/20/2007), TIA (12/03/2007), and Varicose veins.  Preventative health care Age and sex appropriate education and counseling updated with regular exercise and diet Referrals for preventative services - none needed Immunizations addressed - declines shingrix, covid booster Smoking counseling  - none needed Evidence for depression or other mood disorder - none significant Most recent labs reviewed. I have personally reviewed and have noted: 1) the patient's medical and social history 2) The patient's current medications and supplements 3) The patient's height, weight, and BMI have been recorded in the chart   Blood pressure elevated without history of HTN BP < 140/90 per pt at home, continue to follow  Hyperlipidemia Lab Results  Component Value Date   [place] 106 (H) 09/06/2019   uncontrolled, pt to continue current low chol diet, declines statin or zetia  Statin myopathy Declines statin or lipid clinic referral  Vitamin D deficiency Last vitamin D Lab Results  Component Value Date   VD25OH 28.15 (L) 11/15/2018   Low, to start oral replacement  Followup: Return in about 1 year (around 01/22/2022).  Cathlean Cower, MD 01/24/2021 3:50 PM [facility]

## 2021-01-22 NOTE — Patient Instructions (Signed)

## 2021-01-24 ENCOUNTER — Encounter: Payer: Self-pay | Admitting: Internal Medicine

## 2021-01-24 DIAGNOSIS — E559 Vitamin D deficiency, unspecified: Secondary | ICD-10-CM | POA: Insufficient documentation

## 2021-01-24 NOTE — Assessment & Plan Note (Signed)
Age and sex appropriate education and counseling updated with regular exercise and diet Referrals for preventative services - none needed Immunizations addressed - declines shingrix, covid booster Smoking counseling  - none needed Evidence for depression or other mood disorder - none significant Most recent labs reviewed. I have personally reviewed and have noted: 1) the patient's medical and social history 2) The patient's current medications and supplements 3) The patient's height, weight, and BMI have been recorded in the chart

## 2021-01-24 NOTE — Assessment & Plan Note (Signed)
BP < 140/90 per pt at home, continue to follow

## 2021-01-24 NOTE — Assessment & Plan Note (Signed)
Last vitamin D Lab Results  Component Value Date   VD25OH 28.15 (L) 11/15/2018   Low, to start oral replacement

## 2021-01-24 NOTE — Assessment & Plan Note (Signed)
Declines statin or lipid clinic referral

## 2021-01-24 NOTE — Assessment & Plan Note (Addendum)
Lab Results  Component Value Date   LDLCALC 106 (H) 09/06/2019   uncontrolled, pt to continue current low chol diet, declines statin or zetia

## 2021-01-27 ENCOUNTER — Encounter: Payer: Self-pay | Admitting: Internal Medicine

## 2021-01-27 ENCOUNTER — Other Ambulatory Visit (INDEPENDENT_AMBULATORY_CARE_PROVIDER_SITE_OTHER): Payer: PPO

## 2021-01-27 DIAGNOSIS — E559 Vitamin D deficiency, unspecified: Secondary | ICD-10-CM

## 2021-01-27 DIAGNOSIS — R739 Hyperglycemia, unspecified: Secondary | ICD-10-CM

## 2021-01-27 DIAGNOSIS — E538 Deficiency of other specified B group vitamins: Secondary | ICD-10-CM

## 2021-01-27 DIAGNOSIS — E7800 Pure hypercholesterolemia, unspecified: Secondary | ICD-10-CM | POA: Diagnosis not present

## 2021-01-27 LAB — URINALYSIS, ROUTINE W REFLEX MICROSCOPIC
Bilirubin Urine: NEGATIVE
Hgb urine dipstick: NEGATIVE
Ketones, ur: NEGATIVE
Leukocytes,Ua: NEGATIVE
Nitrite: NEGATIVE
RBC / HPF: NONE SEEN (ref 0–?)
Specific Gravity, Urine: 1.015 (ref 1.000–1.030)
Total Protein, Urine: NEGATIVE
Urine Glucose: NEGATIVE
Urobilinogen, UA: 0.2 (ref 0.0–1.0)
pH: 6 (ref 5.0–8.0)

## 2021-01-27 LAB — LIPID PANEL
Cholesterol: 249 mg/dL — ABNORMAL HIGH (ref 0–200)
HDL: 53.4 mg/dL (ref >39.00)
LDL Cholesterol: 177 mg/dL — ABNORMAL HIGH (ref 0–99)
NonHDL: 195.73
Total CHOL/HDL Ratio: 5
Triglycerides: 94 mg/dL (ref 0.0–149.0)
VLDL: 18.8 mg/dL (ref 0.0–40.0)

## 2021-01-27 LAB — CBC WITH DIFFERENTIAL/PLATELET
Basophils Absolute: 0 10*3/uL (ref 0.0–0.1)
Basophils Relative: 0.6 % (ref 0.0–3.0)
Eosinophils Absolute: 0.2 10*3/uL (ref 0.0–0.7)
Eosinophils Relative: 3.5 % (ref 0.0–5.0)
HCT: 48 % (ref 39.0–52.0)
Hemoglobin: 16.1 g/dL (ref 13.0–17.0)
Lymphocytes Relative: 17.4 % (ref 12.0–46.0)
Lymphs Abs: 1 10*3/uL (ref 0.7–4.0)
MCHC: 33.5 g/dL (ref 30.0–36.0)
MCV: 97.4 fl (ref 78.0–100.0)
Monocytes Absolute: 0.7 10*3/uL (ref 0.1–1.0)
Monocytes Relative: 12.1 % — ABNORMAL HIGH (ref 3.0–12.0)
Neutro Abs: 3.8 10*3/uL (ref 1.4–7.7)
Neutrophils Relative %: 66.4 % (ref 43.0–77.0)
Platelets: 211 10*3/uL (ref 150.0–400.0)
RBC: 4.93 Mil/uL (ref 4.22–5.81)
RDW: 12.6 % (ref 11.5–15.5)
WBC: 5.7 10*3/uL (ref 4.0–10.5)

## 2021-01-27 LAB — HEPATIC FUNCTION PANEL
ALT: 16 U/L (ref 0–53)
AST: 21 U/L (ref 0–37)
Albumin: 4.5 g/dL (ref 3.5–5.2)
Alkaline Phosphatase: 65 U/L (ref 39–117)
Bilirubin, Direct: 0.1 mg/dL (ref 0.0–0.3)
Total Bilirubin: 0.7 mg/dL (ref 0.2–1.2)
Total Protein: 7.2 g/dL (ref 6.0–8.3)

## 2021-01-27 LAB — BASIC METABOLIC PANEL
BUN: 15 mg/dL (ref 6–23)
CO2: 24 mEq/L (ref 19–32)
Calcium: 9.6 mg/dL (ref 8.4–10.5)
Chloride: 104 mEq/L (ref 96–112)
Creatinine, Ser: 1.21 mg/dL (ref 0.40–1.50)
GFR: 56.64 mL/min — ABNORMAL LOW (ref >60.00)
Glucose, Bld: 95 mg/dL (ref 70–99)
Potassium: 4.2 mEq/L (ref 3.5–5.1)
Sodium: 142 mEq/L (ref 135–145)

## 2021-01-27 LAB — VITAMIN D 25 HYDROXY (VIT D DEFICIENCY, FRACTURES): VITD: 32.82 ng/mL (ref 30.00–100.00)

## 2021-01-27 LAB — HEMOGLOBIN A1C: Hgb A1c MFr Bld: 5.7 % (ref 4.6–6.5)

## 2021-01-27 LAB — VITAMIN B12: Vitamin B-12: 566 pg/mL (ref 211–911)

## 2021-01-27 LAB — TSH: TSH: 3.53 u[IU]/mL (ref 0.35–5.50)

## 2021-02-01 DIAGNOSIS — L820 Inflamed seborrheic keratosis: Secondary | ICD-10-CM | POA: Diagnosis not present

## 2021-02-01 DIAGNOSIS — L570 Actinic keratosis: Secondary | ICD-10-CM | POA: Diagnosis not present

## 2021-02-01 DIAGNOSIS — X32XXXD Exposure to sunlight, subsequent encounter: Secondary | ICD-10-CM | POA: Diagnosis not present

## 2021-04-15 NOTE — Progress Notes (Deleted)
? ?No chief complaint on file. ? ?History of Present Illness: 81 yo male with history of CAD s/p 3V CABG 02/10/12 (LIMA to LAD, RIMA to RCA, free radial to OM), hyperlipidemia, prostate cancer, CVA here today for cardiac follow up. He presented with unstable angina in January 2014. Cardiac cath 02/10/12 with 99% ostial left main stenosis, 99% ostial RCA stenosis. 3V CABG on 02/10/12. He has done well following his CABG. He has not tolerated statins. Echo April 2018 with normal LV function, mild AI, mild MR.  ? ?He is here today for follow up. The patient denies any chest pain, dyspnea, palpitations, lower extremity edema, orthopnea, PND, dizziness, near syncope or syncope.  ? ?Primary Care Physician: Biagio Borg, MD ? ?Past Medical History:  ?Diagnosis Date  ? ADENOCARCINOMA, 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  ? no evidence of prostate cancer per pt 11-13-14  ? Allergy   ? BRADYCARDIA 09/20/2007  ? CAD (coronary artery disease)   ? CEREBROVASCULAR ACCIDENT, HX OF 09/20/2007  ? COLONIC POLYPS, HX OF 12/18/2006  ? DIVERTICULOSIS, COLON 09/20/2007  ? HYPERLIPIDEMIA 12/18/2006  ? PERIPHERAL VASCULAR DISEASE 09/20/2007  ? TIA 12/03/2007  ? Varicose veins   ? ? ?Past Surgical History:  ?Procedure Laterality Date  ? COLONOSCOPY  06/05/2009  ? last colon 06-05-2009 w/ patterson-polyps  ? COLONOSCOPY WITH PROPOFOL  11/03/2020  ? CORONARY ARTERY BYPASS GRAFT  02/10/2012  ? Procedure: CORONARY ARTERY BYPASS GRAFTING (CABG);  Surgeon: Ivin Poot, MD;  Location: [place];  Service: Open Heart Surgery;  Laterality: N/A;  CABG x three, using Bilateral mammary artery harvest and left arm radial harvest  ? INTRAOPERATIVE TRANSESOPHAGEAL ECHOCARDIOGRAM  02/10/2012  ? Procedure: INTRAOPERATIVE TRANSESOPHAGEAL ECHOCARDIOGRAM;  Surgeon: Ivin Poot, MD;  Location: [place];  Service: Open Heart Surgery;  Laterality: N/A;  ? LEFT HEART CATHETERIZATION WITH CORONARY ANGIOGRAM N/A 02/10/2012  ? Procedure: LEFT HEART CATHETERIZATION WITH CORONARY ANGIOGRAM;   Surgeon: Burnell Blanks, MD;  Location: [facility] CATH LAB;  Service: Cardiovascular;  Laterality: N/A;  ? POLYPECTOMY    ? hx TA colon polyps  ? RADIAL ARTERY HARVEST  02/10/2012  ? Procedure: RADIAL ARTERY HARVEST;  Surgeon: Ivin Poot, MD;  Location: [place];  Service: Open Heart Surgery;  Laterality: Left;  ? ? ?Current Outpatient Medications  ?Medication Sig Dispense Refill  ? aspirin EC 81 MG tablet Take 81 mg by mouth once a week. Swallow whole. (Patient not taking: Reported on 10/14/2020)    ? BEE POLLEN PO Take by mouth daily.    ? Multiple Vitamin (MULTIVITAMIN) tablet Take 1 tablet by mouth daily. Geritol- takes 5 days a week    ? Red Yeast Rice Extract (RED YEAST RICE PO) Take by mouth.    ? Saw Palmetto, Serenoa repens, (SAW PALMETTO PO) Take 1 tablet by mouth daily.    ? triamcinolone cream (KENALOG) 0.1 % Use as directed    ? vitamin C (ASCORBIC ACID) 500 MG tablet Take 500 mg by mouth daily. 5 days a week    ? Vitamin D, Ergocalciferol, (DRISDOL) 1.25 MG (50000 UT) CAPS capsule Take 1 capsule (50,000 Units total) by mouth every 7 (seven) days. 12 capsule 0  ? ?No current facility-administered medications for this visit.  ? ? ?Allergies  ?Allergen Reactions  ? Niacin   ?  REACTION: hives  ? Simvastatin Other (See Comments)  ?  myalgias  ? ? ?Social History  ? ?Socioeconomic History  ? Marital status: Married  ?  Spouse  name: Not on file  ? Number of children: Not on file  ? Years of education: Not on file  ? Highest education level: Not on file  ?Occupational History  ? Not on file  ?Tobacco Use  ? Smoking status: Never  ? Smokeless tobacco: Never  ?Vaping Use  ? Vaping Use: Never used  ?Substance and Sexual Activity  ? Alcohol use: No  ?  Alcohol/week: 0.0 standard drinks  ? Drug use: No  ? Sexual activity: Not on file  ?Other Topics Concern  ? Not on file  ?Social History Narrative  ? Not on file  ? ?Social Determinants of Health  ? ?Financial Resource Strain: Not on file  ?Food Insecurity: Not  on file  ?Transportation Needs: Not on file  ?Physical Activity: Not on file  ?Stress: Not on file  ?Social Connections: Not on file  ?Intimate Partner Violence: Not on file  ? ? ?Family History  ?Problem Relation Age of Onset  ? Cancer Brother   ? Colon cancer Brother 53  ?     died age 23-found out 6 months before passing he had colon cancer  ? Heart disease Father   ? ? ?Review of Systems:  As stated in the HPI and otherwise negative.  ? ?There were no vitals taken for this visit. ? ?Physical Examination: ?General: Well developed, well nourished, NAD  ?HEENT: OP clear, mucus membranes moist  ?SKIN: warm, dry. No rashes. ?Neuro: No focal deficits  ?Musculoskeletal: Muscle strength 5/5 all ext  ?Psychiatric: Mood and affect normal  ?Neck: No JVD, no carotid bruits, no thyromegaly, no lymphadenopathy.  ?Lungs:Clear bilaterally, no wheezes, rhonci, crackles ?Cardiovascular: Regular rate and rhythm. No murmurs, gallops or rubs. ?Abdomen:Soft. Bowel sounds present. Non-tender.  ?Extremities: No lower extremity edema. Pulses are 2 + in the bilateral DP/PT. ? ?Echo 05/16/16: ?- Left ventricle: The cavity size was normal. There was mild focal ?  basal hypertrophy of the septum. Systolic function was normal. ?  The estimated ejection fraction was in the range of 55% to 60%. ?  Wall motion was normal; there were no regional wall motion ?  abnormalities. Features are consistent with a pseudonormal left ?  ventricular filling pattern, with concomitant abnormal relaxation ?  and increased filling pressure (grade 2 diastolic dysfunction). ?- Aortic valve: There was mild regurgitation. ?- Aortic root: The aortic root was mildly dilated. ?- Mitral valve: There was mild regurgitation. ?- Left atrium: The atrium was mildly dilated. ?- Pulmonary arteries: Systolic pressure was mildly increased. PA ?  peak pressure: 36 mm Hg (S). ?  ?Impressions: ?  ?- Normal LV systolic function; moderate diastolic dysfunction; ?  sclerotic aortic  valve with mild AI; mildly dilated aortic root; ?  mild MR; mild LAE; mild TR with mildly elevated pulmonary ?  pressure. ? ?Cardiac cath 02/10/12:  ?Left main: Distal 99% stenosis.  ?Left Anterior Descending Artery: Large caliber vessel that courses to the apex. There is calcification in the proximal vessel. The proximal vessel has a 90% stenosis. The mid vessel has serial 50% stenoses. The distal vessel has mild plaque disease. The diagonal branch is small to moderate sized and has mild plaque disease.  ?Circumflex Artery: Large caliber, calcified vessel with 30% stenosis in the proximal vessel. The obtuse marginal is moderate in caliber and has 30% serial stenoses.  ?Right Coronary Artery: Large, dominant vessel with 99% ostial stenosis. The mid vessel has serial 30% stenoses with moderate calcification. The distal vessel has a 40-50%  stenosis just before the bifurcation. The PDA and posterolateral branches are moderate in caliber with diffuse plaque but no focally obstructive lesions.  ?Left Ventricular Angiogram: LVEF=60-65%.  ? ?EKG:  EKG is *** ordered today. ?The ekg ordered today demonstrates  ? ?Recent Labs: ?01/27/2021: ALT 16; BUN 15; Creatinine, Ser 1.21; Hemoglobin 16.1; Platelets 211.0; Potassium 4.2; Sodium 142; TSH 3.53  ? ?Lipid Panel ?   ?Component Value Date/Time  ? CHOL 249 (H) 01/27/2021 8338  ? CHOL 173 09/06/2019 0817  ? TRIG 94.0 01/27/2021 0811  ? TRIG 61 12/16/2005 0753  ? HDL 53.40 01/27/2021 0811  ? HDL 57 09/06/2019 0817  ? CHOLHDL 5 01/27/2021 0811  ? VLDL 18.8 01/27/2021 0811  ? LDLCALC 177 (H) 01/27/2021 2505  ? [place] 106 (H) 09/06/2019 0817  ? LDLDIRECT 179.4 12/23/2010 0935  ? ?  ?Wt Readings from Last 3 Encounters:  ?01/22/21 185 lb (83.9 kg)  ?11/03/20 184 lb (83.5 kg)  ?10/14/20 184 lb (83.5 kg)  ?  ? ?Other studies Reviewed: ?Additional studies/ records that were reviewed today include: . ?Review of the above records demonstrates:  ? ? ?Assessment and Plan:  ? ?1. CAD without  angina: No chest pain. LV function normal by echo in 2018. He is on ASA but has not tolerated beta blockers due to bradycardia. Continue ASA. *** ? Statin (LDL high Jan 2023).   ? ?2. Hyperlipidemia: LDL is 17

## 2021-04-16 ENCOUNTER — Ambulatory Visit: Payer: PPO | Admitting: Cardiovascular Disease

## 2021-06-03 ENCOUNTER — Ambulatory Visit: Payer: PPO | Admitting: Cardiovascular Disease

## 2021-06-03 NOTE — Progress Notes (Deleted)
No chief complaint on file.  History of Present Illness: 81 yo male with history of CAD s/p 3V CABG 02/10/12 (LIMA to LAD, RIMA to RCA, free radial to OM), hyperlipidemia, prostate cancer, CVA here today for cardiac follow up. He presented with unstable angina in January 2014. Cardiac cath 02/10/12 with 99% ostial left main stenosis, 99% ostial RCA stenosis. 3V CABG on 02/10/12. He has done well following his CABG. He has not tolerated statins. Echo April 2018 with normal LV function, mild AI, mild MR. He has not tolerated statins.   He is here today for follow up. The patient denies any chest pain, dyspnea, palpitations, lower extremity edema, orthopnea, PND, dizziness, near syncope or syncope.   *** ? Repatha ***  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   no evidence of prostate cancer per pt 11-13-14   Allergy    BRADYCARDIA 09/20/2007   CAD (coronary artery disease)    CEREBROVASCULAR ACCIDENT, HX OF 09/20/2007   COLONIC POLYPS, HX OF 12/18/2006   DIVERTICULOSIS, COLON 09/20/2007   HYPERLIPIDEMIA 12/18/2006   PERIPHERAL VASCULAR DISEASE 09/20/2007   TIA 12/03/2007   Varicose veins     Past Surgical History:  Procedure Laterality Date   COLONOSCOPY  06/05/2009   last colon 06-05-2009 w/ patterson-polyps   COLONOSCOPY WITH PROPOFOL  11/03/2020   CORONARY ARTERY BYPASS GRAFT  02/10/2012   Procedure: CORONARY ARTERY BYPASS GRAFTING (CABG);  Surgeon: Ivin Poot, MD;  Location: [place];  Service: Open Heart Surgery;  Laterality: N/A;  CABG x three, using Bilateral mammary artery harvest and left arm radial harvest   INTRAOPERATIVE TRANSESOPHAGEAL ECHOCARDIOGRAM  02/10/2012   Procedure: INTRAOPERATIVE TRANSESOPHAGEAL ECHOCARDIOGRAM;  Surgeon: Ivin Poot, MD;  Location: [place];  Service: Open Heart Surgery;  Laterality: N/A;   LEFT HEART CATHETERIZATION WITH CORONARY ANGIOGRAM N/A 02/10/2012   Procedure: LEFT  HEART CATHETERIZATION WITH CORONARY ANGIOGRAM;  Surgeon: Burnell Blanks, MD;  Location: [facility] CATH LAB;  Service: Cardiovascular;  Laterality: N/A;   POLYPECTOMY     hx TA colon polyps   RADIAL ARTERY HARVEST  02/10/2012   Procedure: RADIAL ARTERY HARVEST;  Surgeon: Ivin Poot, MD;  Location: [place];  Service: Open Heart Surgery;  Laterality: Left;    Current Outpatient Medications  Medication Sig Dispense Refill   aspirin EC 81 MG tablet Take 81 mg by mouth once a week. Swallow whole. (Patient not taking: Reported on 10/14/2020)     BEE POLLEN PO Take by mouth daily.     Multiple Vitamin (MULTIVITAMIN) tablet Take 1 tablet by mouth daily. Geritol- takes 5 days a week     Red Yeast Rice Extract (RED YEAST RICE PO) Take by mouth.     Saw Palmetto, Serenoa repens, (SAW PALMETTO PO) Take 1 tablet by mouth daily.     triamcinolone cream (KENALOG) 0.1 % Use as directed     vitamin C (ASCORBIC ACID) 500 MG tablet Take 500 mg by mouth daily. 5 days a week     Vitamin D, Ergocalciferol, (DRISDOL) 1.25 MG (50000 UT) CAPS capsule Take 1 capsule (50,000 Units total) by mouth every 7 (seven) days. 12 capsule 0   No current facility-administered medications for this visit.    Allergies  Allergen Reactions   Niacin     REACTION: hives   Simvastatin Other (See Comments)    myalgias    Social History   Socioeconomic  History   Marital status: Married    Spouse name: Not on file   Number of children: Not on file   Years of education: Not on file   Highest education level: Not on file  Occupational History   Not on file  Tobacco Use   Smoking status: Never   Smokeless tobacco: Never  Vaping Use   Vaping Use: Never used  Substance and Sexual Activity   Alcohol use: No    Alcohol/week: 0.0 standard drinks   Drug use: No   Sexual activity: Not on file  Other Topics Concern   Not on file  Social History Narrative   Not on file   Social Determinants of Health   Financial  Resource Strain: Not on file  Food Insecurity: Not on file  Transportation Needs: Not on file  Physical Activity: Not on file  Stress: Not on file  Social Connections: Not on file  Intimate Partner Violence: Not on file    Family History  Problem Relation Age of Onset   Cancer Brother    Colon cancer Brother 56       died age 11-found out 6 months before passing he had colon cancer   Heart disease Father     Review of Systems:  As stated in the HPI and otherwise negative.   There were no vitals taken for this visit.  Physical Examination: General: Well developed, well nourished, NAD  HEENT: OP clear, mucus membranes moist  SKIN: warm, dry. No rashes. Neuro: No focal deficits  Musculoskeletal: Muscle strength 5/5 all ext  Psychiatric: Mood and affect normal  Neck: No JVD, no carotid bruits, no thyromegaly, no lymphadenopathy.  Lungs:Clear bilaterally, no wheezes, rhonci, crackles Cardiovascular: Regular rate and rhythm. No murmurs, gallops or rubs. Abdomen:Soft. Bowel sounds present. Non-tender.  Extremities: No lower extremity edema. Pulses are 2 + in the bilateral DP/PT.  Echo 05/16/16: - Left ventricle: The cavity size was normal. There was mild focal   basal hypertrophy of the septum. Systolic function was normal.   The estimated ejection fraction was in the range of 55% to 60%.   Wall motion was normal; there were no regional wall motion   abnormalities. Features are consistent with a pseudonormal left   ventricular filling pattern, with concomitant abnormal relaxation   and increased filling pressure (grade 2 diastolic dysfunction). - Aortic valve: There was mild regurgitation. - Aortic root: The aortic root was mildly dilated. - Mitral valve: There was mild regurgitation. - Left atrium: The atrium was mildly dilated. - Pulmonary arteries: Systolic pressure was mildly increased. PA   peak pressure: 36 mm Hg (S).   Impressions:   - Normal LV systolic function;  moderate diastolic dysfunction;   sclerotic aortic valve with mild AI; mildly dilated aortic root;   mild MR; mild LAE; mild TR with mildly elevated pulmonary   pressure.  Cardiac cath 02/10/12:  Left main: Distal 99% stenosis.  Left Anterior Descending Artery: Large caliber vessel that courses to the apex. There is calcification in the proximal vessel. The proximal vessel has a 90% stenosis. The mid vessel has serial 50% stenoses. The distal vessel has mild plaque disease. The diagonal branch is small to moderate sized and has mild plaque disease.  Circumflex Artery: Large caliber, calcified vessel with 30% stenosis in the proximal vessel. The obtuse marginal is moderate in caliber and has 30% serial stenoses.  Right Coronary Artery: Large, dominant vessel with 99% ostial stenosis. The mid vessel has serial 30%  stenoses with moderate calcification. The distal vessel has a 40-50% stenosis just before the bifurcation. The PDA and posterolateral branches are moderate in caliber with diffuse plaque but no focally obstructive lesions.  Left Ventricular Angiogram: LVEF=60-65%.   EKG:  EKG is *** ordered today. The ekg ordered today demonstrates   Recent Labs: 01/27/2021: ALT 16; BUN 15; Creatinine, Ser 1.21; Hemoglobin 16.1; Platelets 211.0; Potassium 4.2; Sodium 142; TSH 3.53   Lipid Panel    Component Value Date/Time   CHOL 249 (H) 01/27/2021 0811   CHOL 173 09/06/2019 0817   TRIG 94.0 01/27/2021 0811   TRIG 61 12/16/2005 0753   HDL 53.40 01/27/2021 0811   HDL 57 09/06/2019 0817   CHOLHDL 5 01/27/2021 0811   VLDL 18.8 01/27/2021 0811   LDLCALC 177 (H) 01/27/2021 0811   LDLCALC 106 (H) 09/06/2019 0817   LDLDIRECT 179.4 12/23/2010 0935     Wt Readings from Last 3 Encounters:  01/22/21 185 lb (83.9 kg)  11/03/20 184 lb (83.5 kg)  10/14/20 184 lb (83.5 kg)     Other studies Reviewed: Additional studies/ records that were reviewed today include: . Review of the above records  demonstrates:    Assessment and Plan:   1. CAD without angina: No chest pain. LV function normal by echo in 2018. He is on ASA but has not tolerated beta blockers due to bradycardia. Will continue ASA.   2. Hyperlipidemia: LDL 177 in January 2023 off of statins. *** lipid clinic referral.    3. HTN: BP is controlled at home in the 130s [facility] 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 No changes today  4. Lower extremity edema: This is consistent with dependent edema.   Current medicines are reviewed at length with the patient today.  The patient does not have concerns regarding medicines.  The following changes have been made:  no change  Labs/ tests ordered today include:   No orders of the defined types were placed in this encounter.    Disposition:   FU with me in 12  months   Signed, Lauree Chandler, MD 06/03/2021 7:35 AM    [facility] [address], [place], [place]  [address] Phone: [phone]; Fax: [phone]

## 2021-09-10 ENCOUNTER — Ambulatory Visit: Payer: PPO | Admitting: Cardiovascular Disease

## 2021-10-05 DIAGNOSIS — L570 Actinic keratosis: Secondary | ICD-10-CM | POA: Diagnosis not present

## 2021-10-05 DIAGNOSIS — X32XXXD Exposure to sunlight, subsequent encounter: Secondary | ICD-10-CM | POA: Diagnosis not present

## 2021-10-06 ENCOUNTER — Ambulatory Visit: Payer: PPO | Admitting: Cardiovascular Disease

## 2021-10-06 NOTE — Progress Notes (Deleted)
No chief complaint on file.  History of Present Illness: 81 yo male with history of CAD s/p 3V CABG 02/10/12 (LIMA to LAD, RIMA to RCA, free radial to OM), hyperlipidemia, prostate cancer, CVA here today for cardiac follow up. He presented with unstable angina in January 2014. Cardiac cath 02/10/12 with 99% ostial left main stenosis, 99% ostial RCA stenosis. 3V CABG on 02/10/12. He has done well following his CABG. He has not tolerated statins. Echo April 2018 with normal LV function, mild AI, mild MR.   He is here today for follow up. The patient denies any chest pain, dyspnea, palpitations, lower extremity edema, orthopnea, PND, dizziness, near syncope or syncope.   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   no evidence of prostate cancer per pt 11-13-14   Allergy    BRADYCARDIA 09/20/2007   CAD (coronary artery disease)    CEREBROVASCULAR ACCIDENT, HX OF 09/20/2007   COLONIC POLYPS, HX OF 12/18/2006   DIVERTICULOSIS, COLON 09/20/2007   HYPERLIPIDEMIA 12/18/2006   PERIPHERAL VASCULAR DISEASE 09/20/2007   TIA 12/03/2007   Varicose veins     Past Surgical History:  Procedure Laterality Date   COLONOSCOPY  06/05/2009   last colon 06-05-2009 w/ patterson-polyps   COLONOSCOPY WITH PROPOFOL  11/03/2020   CORONARY ARTERY BYPASS GRAFT  02/10/2012   Procedure: CORONARY ARTERY BYPASS GRAFTING (CABG);  Surgeon: Ivin Poot, MD;  Location: [facility];  Service: Open Heart Surgery;  Laterality: N/A;  CABG x three, using Bilateral mammary artery harvest and left arm radial harvest   INTRAOPERATIVE TRANSESOPHAGEAL ECHOCARDIOGRAM  02/10/2012   Procedure: INTRAOPERATIVE TRANSESOPHAGEAL ECHOCARDIOGRAM;  Surgeon: Ivin Poot, MD;  Location: [facility];  Service: Open Heart Surgery;  Laterality: N/A;   LEFT HEART CATHETERIZATION WITH CORONARY ANGIOGRAM N/A 02/10/2012   Procedure: LEFT HEART CATHETERIZATION WITH CORONARY ANGIOGRAM;   Surgeon: Burnell Blanks, MD;  Location[facility];  Service: Cardiovascular;  Laterality: N/A;   POLYPECTOMY     hx TA colon polyps   RADIAL ARTERY HARVEST  02/10/2012   Procedure: RADIAL ARTERY HARVEST;  Surgeon: Ivin Poot, MD;  Location: [facility];  Service: Open Heart Surgery;  Laterality: Left;    Current Outpatient Medications  Medication Sig Dispense Refill   aspirin EC 81 MG tablet Take 81 mg by mouth once a week. Swallow whole. (Patient not taking: Reported on 10/14/2020)     BEE POLLEN PO Take by mouth daily.     Multiple Vitamin (MULTIVITAMIN) tablet Take 1 tablet by mouth daily. Geritol- takes 5 days a week     Red Yeast Rice Extract (RED YEAST RICE PO) Take by mouth.     Saw Palmetto, Serenoa repens, (SAW PALMETTO PO) Take 1 tablet by mouth daily.     triamcinolone cream (KENALOG) 0.1 % Use as directed     vitamin C (ASCORBIC ACID) 500 MG tablet Take 500 mg by mouth daily. 5 days a week     Vitamin D, Ergocalciferol, (DRISDOL) 1.25 MG (50000 UT) CAPS capsule Take 1 capsule (50,000 Units total) by mouth every 7 (seven) days. 12 capsule 0   No current facility-administered medications for this visit.    Allergies  Allergen Reactions   Niacin     REACTION: hives   Simvastatin Other (See Comments)    myalgias    Social History   Socioeconomic History   Marital status: Married    Spouse  name: Not on file   Number of children: Not on file   Years of education: Not on file   Highest education level: Not on file  Occupational History   Not on file  Tobacco Use   Smoking status: Never   Smokeless tobacco: Never  Vaping Use   Vaping Use: Never used  Substance and Sexual Activity   Alcohol use: No    Alcohol/week: 0.0 standard drinks of alcohol   Drug use: No   Sexual activity: Not on file  Other Topics Concern   Not on file  Social History Narrative   Not on file   Social Determinants of Health   Financial Resource Strain: Not on file  Food  Insecurity: Not on file  Transportation Needs: Not on file  Physical Activity: Not on file  Stress: Not on file  Social Connections: Not on file  Intimate Partner Violence: Not on file    Family History  Problem Relation Age of Onset   Cancer Brother    Colon cancer Brother 16       died age 47-found out 6 months before passing he had colon cancer   Heart disease Father     Review of Systems:  As stated in the HPI and otherwise negative.   There were no vitals taken for this visit.  Physical Examination: General: Well developed, well nourished, NAD  HEENT: OP clear, mucus membranes moist  SKIN: warm, dry. No rashes. Neuro: No focal deficits  Musculoskeletal: Muscle strength 5/5 all ext  Psychiatric: Mood and affect normal  Neck: No JVD, no carotid bruits, no thyromegaly, no lymphadenopathy.  Lungs:Clear bilaterally, no wheezes, rhonci, crackles Cardiovascular: Regular rate and rhythm. No murmurs, gallops or rubs. Abdomen:Soft. Bowel sounds present. Non-tender.  Extremities: No lower extremity edema. Pulses are 2 + in the bilateral DP/PT.  Echo 05/16/16: - Left ventricle: The cavity size was normal. There was mild focal   basal hypertrophy of the septum. Systolic function was normal.   The estimated ejection fraction was in the range of 55% to 60%.   Wall motion was normal; there were no regional wall motion   abnormalities. Features are consistent with a pseudonormal left   ventricular filling pattern, with concomitant abnormal relaxation   and increased filling pressure (grade 2 diastolic dysfunction). - Aortic valve: There was mild regurgitation. - Aortic root: The aortic root was mildly dilated. - Mitral valve: There was mild regurgitation. - Left atrium: The atrium was mildly dilated. - Pulmonary arteries: Systolic pressure was mildly increased. PA   peak pressure: 36 mm Hg (S).   Impressions:   - Normal LV systolic function; moderate diastolic dysfunction;    sclerotic aortic valve with mild AI; mildly dilated aortic root;   mild MR; mild LAE; mild TR with mildly elevated pulmonary   pressure.  Cardiac cath 02/10/12:  Left main: Distal 99% stenosis.  Left Anterior Descending Artery: Large caliber vessel that courses to the apex. There is calcification in the proximal vessel. The proximal vessel has a 90% stenosis. The mid vessel has serial 50% stenoses. The distal vessel has mild plaque disease. The diagonal branch is small to moderate sized and has mild plaque disease.  Circumflex Artery: Large caliber, calcified vessel with 30% stenosis in the proximal vessel. The obtuse marginal is moderate in caliber and has 30% serial stenoses.  Right Coronary Artery: Large, dominant vessel with 99% ostial stenosis. The mid vessel has serial 30% stenoses with moderate calcification. The distal vessel has  a 40-50% stenosis just before the bifurcation. The PDA and posterolateral branches are moderate in caliber with diffuse plaque but no focally obstructive lesions.  Left Ventricular Angiogram: LVEF=60-65%.   EKG:  EKG is *** ordered today. The ekg ordered today demonstrates   Recent Labs: 01/27/2021: ALT 16; BUN 15; Creatinine, Ser 1.21; Hemoglobin 16.1; Platelets 211.0; Potassium 4.2; Sodium 142; TSH 3.53   Lipid Panel    Component Value Date/Time   CHOL 249 (H) 01/27/2021 0811   CHOL 173 09/06/2019 0817   TRIG 94.0 01/27/2021 0811   TRIG 61 12/16/2005 0753   HDL 53.40 01/27/2021 0811   HDL 57 09/06/2019 0817   CHOLHDL 5 01/27/2021 0811   VLDL 18.8 01/27/2021 0811   LDLCALC 177 (H) 01/27/2021 0811   LDLCALC 106 (H) 09/06/2019 0817   LDLDIRECT 179.4 12/23/2010 0935     Wt Readings from Last 3 Encounters:  01/22/21 185 lb (83.9 kg)  11/03/20 184 lb (83.5 kg)  10/14/20 184 lb (83.5 kg)     Other studies Reviewed: Additional studies/ records that were reviewed today include: . Review of the above records demonstrates:    Assessment and Plan:   *** 1. CAD without angina: No chest pain. LV function normal by echo in 2018. He is on ASA but has not tolerated beta blockers due to bradycardia. Will continue ASA and statin.   2. Hyperlipidemia:  LDL 177 in January 2023.  We have discussed referral to the lipid clinic to consider Praluent or Repatha at his prior visit. He has not wished to be seen there. *Currently on no therapy for his HLD.    3. HTN: BP is controlled. NO changes. 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   4. Lower extremity edema: This is consistent with dependent edema. NO changes  Current medicines are reviewed at length with the patient today.  The patient does not have concerns regarding medicines.  The following changes have been made:  no change  Labs/ tests ordered today include:   No orders of the defined types were placed in this encounter.    Disposition:   F/U with me in 12  months   Signed, Lauree Chandler, MD 10/06/2021 11:25 AM    [facility], [place], [place]  [address] Phone: [phone]; Fax: [phone]

## 2021-10-10 NOTE — Progress Notes (Deleted)
No chief complaint on file.  History of Present Illness: 81 yo male with history of CAD s/p 3V CABG 02/10/12 (LIMA to LAD, RIMA to RCA, free radial to OM), hyperlipidemia, prostate cancer, CVA here today for cardiac follow up. He presented with unstable angina in January 2014. Cardiac cath 02/10/12 with 99% ostial left main stenosis, 99% ostial RCA stenosis. 3V CABG on 02/10/12. He has done well following his CABG. He has not tolerated statins. Echo April 2018 with normal LV function, mild AI, mild MR.   He is here today for follow up. The patient denies any chest pain, dyspnea, palpitations, lower extremity edema, orthopnea, PND, dizziness, near syncope or syncope.    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   no evidence of prostate cancer per pt 11-13-14   Allergy    BRADYCARDIA 09/20/2007   CAD (coronary artery disease)    CEREBROVASCULAR ACCIDENT, HX OF 09/20/2007   COLONIC POLYPS, HX OF 12/18/2006   DIVERTICULOSIS, COLON 09/20/2007   HYPERLIPIDEMIA 12/18/2006   PERIPHERAL VASCULAR DISEASE 09/20/2007   TIA 12/03/2007   Varicose veins     Past Surgical History:  Procedure Laterality Date   COLONOSCOPY  06/05/2009   last colon 06-05-2009 w/ patterson-polyps   COLONOSCOPY WITH PROPOFOL  11/03/2020   CORONARY ARTERY BYPASS GRAFT  02/10/2012   Procedure: CORONARY ARTERY BYPASS GRAFTING (CABG);  Surgeon: Ivin Poot, MD;  Location: [place];  Service: Open Heart Surgery;  Laterality: N/A;  CABG x three, using Bilateral mammary artery harvest and left arm radial harvest   INTRAOPERATIVE TRANSESOPHAGEAL ECHOCARDIOGRAM  02/10/2012   Procedure: INTRAOPERATIVE TRANSESOPHAGEAL ECHOCARDIOGRAM;  Surgeon: Ivin Poot, MD;  Location: [place];  Service: Open Heart Surgery;  Laterality: N/A;   LEFT HEART CATHETERIZATION WITH CORONARY ANGIOGRAM N/A 02/10/2012   Procedure: LEFT HEART CATHETERIZATION WITH CORONARY ANGIOGRAM;   Surgeon: Burnell Blanks, MD;  Location: [facility] CATH LAB;  Service: Cardiovascular;  Laterality: N/A;   POLYPECTOMY     hx TA colon polyps   RADIAL ARTERY HARVEST  02/10/2012   Procedure: RADIAL ARTERY HARVEST;  Surgeon: Ivin Poot, MD;  Location: [facility];  Service: Open Heart Surgery;  Laterality: Left;    Current Outpatient Medications  Medication Sig Dispense Refill   aspirin EC 81 MG tablet Take 81 mg by mouth once a week. Swallow whole. (Patient not taking: Reported on 10/14/2020)     BEE POLLEN PO Take by mouth daily.     Multiple Vitamin (MULTIVITAMIN) tablet Take 1 tablet by mouth daily. Geritol- takes 5 days a week     Red Yeast Rice Extract (RED YEAST RICE PO) Take by mouth.     Saw Palmetto, Serenoa repens, (SAW PALMETTO PO) Take 1 tablet by mouth daily.     triamcinolone cream (KENALOG) 0.1 % Use as directed     vitamin C (ASCORBIC ACID) 500 MG tablet Take 500 mg by mouth daily. 5 days a week     Vitamin D, Ergocalciferol, (DRISDOL) 1.25 MG (50000 UT) CAPS capsule Take 1 capsule (50,000 Units total) by mouth every 7 (seven) days. 12 capsule 0   No current facility-administered medications for this visit.    Allergies  Allergen Reactions   Niacin     REACTION: hives   Simvastatin Other (See Comments)    myalgias    Social History   Socioeconomic History   Marital status: Married  Spouse name: Not on file   Number of children: Not on file   Years of education: Not on file   Highest education level: Not on file  Occupational History   Not on file  Tobacco Use   Smoking status: Never   Smokeless tobacco: Never  Vaping Use   Vaping Use: Never used  Substance and Sexual Activity   Alcohol use: No    Alcohol/week: 0.0 standard drinks of alcohol   Drug use: No   Sexual activity: Not on file  Other Topics Concern   Not on file  Social History Narrative   Not on file   Social Determinants of Health   Financial Resource Strain: Not on file  Food  Insecurity: Not on file  Transportation Needs: Not on file  Physical Activity: Not on file  Stress: Not on file  Social Connections: Not on file  Intimate Partner Violence: Not on file    Family History  Problem Relation Age of Onset   Cancer Brother    Colon cancer Brother 79       died age 53-found out 6 months before passing he had colon cancer   Heart disease Father     Review of Systems:  As stated in the HPI and otherwise negative.   There were no vitals taken for this visit.  Physical Examination: General: Well developed, well nourished, NAD  HEENT: OP clear, mucus membranes moist  SKIN: warm, dry. No rashes. Neuro: No focal deficits  Musculoskeletal: Muscle strength 5/5 all ext  Psychiatric: Mood and affect normal  Neck: No JVD, no carotid bruits, no thyromegaly, no lymphadenopathy.  Lungs:Clear bilaterally, no wheezes, rhonci, crackles Cardiovascular: Regular rate and rhythm. No murmurs, gallops or rubs. Abdomen:Soft. Bowel sounds present. Non-tender.  Extremities: No lower extremity edema. Pulses are 2 + in the bilateral DP/PT.  Echo 05/16/16: - Left ventricle: The cavity size was normal. There was mild focal   basal hypertrophy of the septum. Systolic function was normal.   The estimated ejection fraction was in the range of 55% to 60%.   Wall motion was normal; there were no regional wall motion   abnormalities. Features are consistent with a pseudonormal left   ventricular filling pattern, with concomitant abnormal relaxation   and increased filling pressure (grade 2 diastolic dysfunction). - Aortic valve: There was mild regurgitation. - Aortic root: The aortic root was mildly dilated. - Mitral valve: There was mild regurgitation. - Left atrium: The atrium was mildly dilated. - Pulmonary arteries: Systolic pressure was mildly increased. PA   peak pressure: 36 mm Hg (S).   Impressions:   - Normal LV systolic function; moderate diastolic dysfunction;    sclerotic aortic valve with mild AI; mildly dilated aortic root;   mild MR; mild LAE; mild TR with mildly elevated pulmonary   pressure.  Cardiac cath 02/10/12:  Left main: Distal 99% stenosis.  Left Anterior Descending Artery: Large caliber vessel that courses to the apex. There is calcification in the proximal vessel. The proximal vessel has a 90% stenosis. The mid vessel has serial 50% stenoses. The distal vessel has mild plaque disease. The diagonal branch is small to moderate sized and has mild plaque disease.  Circumflex Artery: Large caliber, calcified vessel with 30% stenosis in the proximal vessel. The obtuse marginal is moderate in caliber and has 30% serial stenoses.  Right Coronary Artery: Large, dominant vessel with 99% ostial stenosis. The mid vessel has serial 30% stenoses with moderate calcification. The distal vessel  has a 40-50% stenosis just before the bifurcation. The PDA and posterolateral branches are moderate in caliber with diffuse plaque but no focally obstructive lesions.  Left Ventricular Angiogram: LVEF=60-65%.   EKG:  EKG is *** ordered today. The ekg ordered today demonstrates   Recent Labs: 01/27/2021: ALT 16; BUN 15; Creatinine, Ser 1.21; Hemoglobin 16.1; Platelets 211.0; Potassium 4.2; Sodium 142; TSH 3.53   Lipid Panel    Component Value Date/Time   CHOL 249 (H) 01/27/2021 0811   CHOL 173 09/06/2019 0817   TRIG 94.0 01/27/2021 0811   TRIG 61 12/16/2005 0753   HDL 53.40 01/27/2021 0811   HDL 57 09/06/2019 0817   CHOLHDL 5 01/27/2021 0811   VLDL 18.8 01/27/2021 0811   LDLCALC 177 (H) 01/27/2021 0811   LDLCALC 106 (H) 09/06/2019 0817   LDLDIRECT 179.4 12/23/2010 0935     Wt Readings from Last 3 Encounters:  01/22/21 185 lb (83.9 kg)  11/03/20 184 lb (83.5 kg)  10/14/20 184 lb (83.5 kg)    Assessment and Plan:   1. CAD without angina: He has no chest pain. LV function normal by echo in 2018. He is on ASA but has not tolerated beta blockers due to  bradycardia. Continue ASA and *** statin.   2. Hyperlipidemia:  LDL 177 in January 2023.  We have discussed referral to the lipid clinic to consider Praluent or Repatha at his prior visit. He has not wished to be seen there. *** Currently on no therapy for his HLD.    3. HTN: BP is ***. 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   4. Lower extremity edema: This is consistent with dependent edema. No changes  Labs/ tests ordered today include:  No orders of the defined types were placed in this encounter.  Disposition:   F/U with me in 12  months   Signed, Lauree Chandler, MD 10/10/2021 10:04 AM    [facility] [address], [place], [place]  [address] Phone: [phone]; Fax: [phone]

## 2021-10-11 ENCOUNTER — Ambulatory Visit: Payer: PPO | Admitting: Cardiovascular Disease

## 2021-12-01 ENCOUNTER — Ambulatory Visit: Payer: PPO | Admitting: Cardiovascular Disease

## 2022-02-09 ENCOUNTER — Encounter: Payer: PPO | Admitting: Internal Medicine

## 2022-02-22 ENCOUNTER — Ambulatory Visit (INDEPENDENT_AMBULATORY_CARE_PROVIDER_SITE_OTHER): Payer: PPO | Admitting: Internal Medicine

## 2022-02-22 VITALS — BP 126/64 | HR 61 | Temp 98.5°F | Ht 70.0 in | Wt 188.0 lb

## 2022-02-22 DIAGNOSIS — R030 Elevated blood-pressure reading, without diagnosis of hypertension: Secondary | ICD-10-CM | POA: Diagnosis not present

## 2022-02-22 DIAGNOSIS — T466X5A Adverse effect of antihyperlipidemic and antiarteriosclerotic drugs, initial encounter: Secondary | ICD-10-CM | POA: Diagnosis not present

## 2022-02-22 DIAGNOSIS — Z0000 Encounter for general adult medical examination without abnormal findings: Secondary | ICD-10-CM

## 2022-02-22 DIAGNOSIS — Z23 Encounter for immunization: Secondary | ICD-10-CM

## 2022-02-22 DIAGNOSIS — R739 Hyperglycemia, unspecified: Secondary | ICD-10-CM

## 2022-02-22 DIAGNOSIS — Z0001 Encounter for general adult medical examination with abnormal findings: Secondary | ICD-10-CM | POA: Diagnosis not present

## 2022-02-22 DIAGNOSIS — E559 Vitamin D deficiency, unspecified: Secondary | ICD-10-CM | POA: Diagnosis not present

## 2022-02-22 DIAGNOSIS — G720 Drug-induced myopathy: Secondary | ICD-10-CM

## 2022-02-22 DIAGNOSIS — E538 Deficiency of other specified B group vitamins: Secondary | ICD-10-CM

## 2022-02-22 DIAGNOSIS — E7800 Pure hypercholesterolemia, unspecified: Secondary | ICD-10-CM | POA: Diagnosis not present

## 2022-02-22 NOTE — Progress Notes (Signed)
Patient ID: [name] [name], male   DOB: 1940/08/25, 82 y.o.   MRN: [id number]         Chief Complaint:: wellness exam and low vit d, statin myopathy, hld, elevated BP wihtout htn,        HPI:  [name] [name] is a 82 y.o. male here for wellness exam, for flu shot today, for shingrix at pharmacy, o/w up to date                        Also Pt denies chest pain, increased sob or doe, wheezing, orthopnea, PND, increased LE swelling, palpitations, dizziness or syncope.   Pt denies polydipsia, polyuria, or new focal neuro s/s.    Pt denies fever, wt loss, night sweats, loss of appetite, or other constitutional symptoms  Has been statin intolerant in past, declines re try today.  BP has been < 14090 at home   Wt Readings from Last 3 Encounters:  02/22/22 188 lb (85.3 kg)  01/22/21 185 lb (83.9 kg)  11/03/20 184 lb (83.5 kg)   BP Readings from Last 3 Encounters:  02/22/22 126/64  01/22/21 (!) 190/84  11/03/20 139/61   Immunization History  Administered Date(s) Administered   Fluad Quad(high Dose 65+) 02/22/2022   Influenza Split 12/23/2010   Influenza Whole 11/16/2007, 10/23/2009   Influenza, High Dose Seasonal PF 09/23/2014, 12/25/2015, 11/02/2017, 10/31/2018   Influenza-Unspecified 10/17/2013, 10/17/2016, 12/28/2020   PFIZER Comirnaty(Gray Top)Covid-19 Tri-Sucrose Vaccine 11/25/2019, 01/27/2020   Pneumococcal Conjugate-13 03/21/2017   Pneumococcal Polysaccharide-23 09/23/2014   Td 01/18/1996, 01/18/2004, 09/23/2014  There are no preventive care reminders to display for this patient.    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   no evidence of prostate cancer per pt 11-13-14   Allergy    BRADYCARDIA 09/20/2007   CAD (coronary artery disease)    CEREBROVASCULAR ACCIDENT, HX OF 09/20/2007   COLONIC POLYPS, HX OF 12/18/2006   DIVERTICULOSIS, COLON 09/20/2007   HYPERLIPIDEMIA 12/18/2006   PERIPHERAL VASCULAR DISEASE 09/20/2007   TIA 12/03/2007   Varicose veins     Past Surgical History:  Procedure Laterality Date   COLONOSCOPY  06/05/2009   last colon 06-05-2009 w/ patterson-polyps   COLONOSCOPY WITH PROPOFOL  11/03/2020   CORONARY ARTERY BYPASS GRAFT  02/10/2012   Procedure: CORONARY ARTERY BYPASS GRAFTING (CABG);  Surgeon: Ivin Poot, MD;  Location: [place];  Service: Open Heart Surgery;  Laterality: N/A;  CABG x three, using Bilateral mammary artery harvest and left arm radial harvest   INTRAOPERATIVE TRANSESOPHAGEAL ECHOCARDIOGRAM  02/10/2012   Procedure: INTRAOPERATIVE TRANSESOPHAGEAL ECHOCARDIOGRAM;  Surgeon: Ivin Poot, MD;  Location: [place];  Service: Open Heart Surgery;  Laterality: N/A;   LEFT HEART CATHETERIZATION WITH CORONARY ANGIOGRAM N/A 02/10/2012   Procedure: LEFT HEART CATHETERIZATION WITH CORONARY ANGIOGRAM;  Surgeon: Burnell Blanks, MD;  Location: [facility] CATH LAB;  Service: Cardiovascular;  Laterality: N/A;   POLYPECTOMY     hx TA colon polyps   RADIAL ARTERY HARVEST  02/10/2012   Procedure: RADIAL ARTERY HARVEST;  Surgeon: Ivin Poot, MD;  Location: [place];  Service: Open Heart Surgery;  Laterality: Left;    reports that he has never smoked. He has never used smokeless tobacco. He reports that he does not drink alcohol and does not use drugs. family history includes Cancer in his brother; Colon cancer (age of onset: 62) in his brother; Heart disease in his father. Allergies  Allergen Reactions  Niacin     REACTION: hives   Simvastatin Other (See Comments)    myalgias   Current Outpatient Medications on File Prior to Visit  Medication Sig Dispense Refill   aspirin EC 81 MG tablet Take 81 mg by mouth once a week. Swallow whole.     BEE POLLEN PO Take by mouth daily.     Multiple Vitamin (MULTIVITAMIN) tablet Take 1 tablet by mouth daily. Geritol- takes 5 days a week     Red Yeast Rice Extract (RED YEAST RICE PO) Take by mouth.     Saw Palmetto, Serenoa repens, (SAW PALMETTO PO) Take 1 tablet by mouth daily.      triamcinolone cream (KENALOG) 0.1 % Use as directed     vitamin C (ASCORBIC ACID) 500 MG tablet Take 500 mg by mouth daily. 5 days a week     Vitamin D, Ergocalciferol, (DRISDOL) 1.25 MG (50000 UT) CAPS capsule Take 1 capsule (50,000 Units total) by mouth every 7 (seven) days. 12 capsule 0   No current facility-administered medications on file prior to visit.        ROS:  All others reviewed and negative.  Objective        PE:  BP 126/64 (BP Location: Right Arm, Patient Position: Sitting, Cuff Size: Large)   Pulse 61   Temp 98.5 F (36.9 C) (Oral)   Ht 5' 10"$  (1.778 m)   Wt 188 lb (85.3 kg)   SpO2 98%   BMI 26.98 kg/m                 Constitutional: Pt appears in NAD               HENT: Head: NCAT.                Right Ear: External ear normal.                 Left Ear: External ear normal.                Eyes: . Pupils are equal, round, and reactive to light. Conjunctivae and EOM are normal               Nose: without d/c or deformity               Neck: Neck supple. Gross normal ROM               Cardiovascular: Normal rate and regular rhythm.                 Pulmonary/Chest: Effort normal and breath sounds without rales or wheezing.                Abd:  Soft, NT, ND, + BS, no organomegaly               Neurological: Pt is alert. At baseline orientation, motor grossly intact               Skin: Skin is warm. No rashes, no other new lesions, LE edema - none               Psychiatric: Pt behavior is normal without agitation   Micro: none  Cardiac tracings I have personally interpreted today:  none  Pertinent Radiological findings (summarize): none   Lab Results  Component Value Date   WBC 5.7 01/27/2021   HGB 16.1 01/27/2021   HCT 48.0 01/27/2021   PLT 211.0 01/27/2021   GLUCOSE  95 01/27/2021   CHOL 249 (H) 01/27/2021   TRIG 94.0 01/27/2021   HDL 53.40 01/27/2021   LDLDIRECT 179.4 12/23/2010   LDLCALC 177 (H) 01/27/2021   ALT 16 01/27/2021   AST 21 01/27/2021    NA 142 01/27/2021   K 4.2 01/27/2021   CL 104 01/27/2021   CREATININE 1.21 01/27/2021   BUN 15 01/27/2021   CO2 24 01/27/2021   TSH 3.53 01/27/2021   PSA 12.89 (H) 11/15/2018   INR 1.40 02/11/2012   HGBA1C 5.7 01/27/2021   Assessment/Plan:  [name] [name] is a 82 y.o. White or Caucasian [1] male with  has a past medical history of ADENOCARCINOMA, PROSTATE (09/20/2007), Allergy, BRADYCARDIA (09/20/2007), CAD (coronary artery disease), CEREBROVASCULAR ACCIDENT, HX OF (09/20/2007), COLONIC POLYPS, HX OF (12/18/2006), DIVERTICULOSIS, COLON (09/20/2007), HYPERLIPIDEMIA (12/18/2006), PERIPHERAL VASCULAR DISEASE (09/20/2007), TIA (12/03/2007), and Varicose veins.  Encounter for well adult exam with abnormal findings Age and sex appropriate education and counseling updated with regular exercise and diet Referrals for preventative services - none needed Immunizations addressed - for flu shot today, and shingrix at pharmacy Smoking counseling  - none needed Evidence for depression or other mood disorder - none significant Most recent labs reviewed. 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  Cathlean Cower, MD 02/25/2022 5:09 AM [facility]

## 2022-02-22 NOTE — Patient Instructions (Signed)
You had the flu shot today  Please have your Shingrix (shingles) shots done at your local pharmacy.  Please continue all other medications as before, and refills have been done if requested.  Please have the pharmacy call with any other refills you may need.  Please continue your efforts at being more active, low cholesterol diet, and weight control.  You are otherwise up to date with prevention measures today.  Please keep your appointments with your specialists as you may have planned  Please go to the LAB at the blood drawing area for the tests to be done  You will be contacted by phone if any changes need to be made immediately.  Otherwise, you will receive a letter about your results with an explanation, but please check with MyChart first.  Please remember to sign up for MyChart if you have not done so, as this will be important to you in the future with finding out test results, communicating by private email, and scheduling acute appointments online when needed.  Please make an Appointment to return for your 1 year visit, or sooner if needed

## 2022-02-25 ENCOUNTER — Encounter: Payer: Self-pay | Admitting: Internal Medicine

## 2022-02-25 ENCOUNTER — Other Ambulatory Visit (INDEPENDENT_AMBULATORY_CARE_PROVIDER_SITE_OTHER): Payer: PPO

## 2022-02-25 DIAGNOSIS — R739 Hyperglycemia, unspecified: Secondary | ICD-10-CM | POA: Diagnosis not present

## 2022-02-25 DIAGNOSIS — E559 Vitamin D deficiency, unspecified: Secondary | ICD-10-CM

## 2022-02-25 DIAGNOSIS — E7800 Pure hypercholesterolemia, unspecified: Secondary | ICD-10-CM | POA: Diagnosis not present

## 2022-02-25 DIAGNOSIS — E538 Deficiency of other specified B group vitamins: Secondary | ICD-10-CM

## 2022-02-25 LAB — HEPATIC FUNCTION PANEL
ALT: 14 U/L (ref 0–53)
AST: 20 U/L (ref 0–37)
Albumin: 4.1 g/dL (ref 3.5–5.2)
Alkaline Phosphatase: 57 U/L (ref 39–117)
Bilirubin, Direct: 0.1 mg/dL (ref 0.0–0.3)
Total Bilirubin: 0.4 mg/dL (ref 0.2–1.2)
Total Protein: 6.9 g/dL (ref 6.0–8.3)

## 2022-02-25 LAB — CBC WITH DIFFERENTIAL/PLATELET
Basophils Absolute: 0 10*3/uL (ref 0.0–0.1)
Basophils Relative: 0.4 % (ref 0.0–3.0)
Eosinophils Absolute: 0.2 10*3/uL (ref 0.0–0.7)
Eosinophils Relative: 3.4 % (ref 0.0–5.0)
HCT: 45.7 % (ref 39.0–52.0)
Hemoglobin: 15.4 g/dL (ref 13.0–17.0)
Lymphocytes Relative: 14.8 % (ref 12.0–46.0)
Lymphs Abs: 0.9 10*3/uL (ref 0.7–4.0)
MCHC: 33.7 g/dL (ref 30.0–36.0)
MCV: 97.4 fl (ref 78.0–100.0)
Monocytes Absolute: 0.7 10*3/uL (ref 0.1–1.0)
Monocytes Relative: 11.6 % (ref 3.0–12.0)
Neutro Abs: 4.2 10*3/uL (ref 1.4–7.7)
Neutrophils Relative %: 69.8 % (ref 43.0–77.0)
Platelets: 228 10*3/uL (ref 150.0–400.0)
RBC: 4.7 Mil/uL (ref 4.22–5.81)
RDW: 12.3 % (ref 11.5–15.5)
WBC: 6 10*3/uL (ref 4.0–10.5)

## 2022-02-25 LAB — MICROALBUMIN / CREATININE URINE RATIO
Creatinine,U: 43.6 mg/dL
Microalb Creat Ratio: 1.6 mg/g (ref 0.0–30.0)
Microalb, Ur: <0.7 mg/dL (ref 0.0–1.9)

## 2022-02-25 LAB — BASIC METABOLIC PANEL
BUN: 13 mg/dL (ref 6–23)
CO2: 30 mEq/L (ref 19–32)
Calcium: 9.1 mg/dL (ref 8.4–10.5)
Chloride: 99 mEq/L (ref 96–112)
Creatinine, Ser: 1.07 mg/dL (ref 0.40–1.50)
GFR: 65.15 mL/min (ref >60.00)
Glucose, Bld: 92 mg/dL (ref 70–99)
Potassium: 3.9 mEq/L (ref 3.5–5.1)
Sodium: 137 mEq/L (ref 135–145)

## 2022-02-25 LAB — LIPID PANEL
Cholesterol: 239 mg/dL — ABNORMAL HIGH (ref 0–200)
HDL: 49.2 mg/dL (ref >39.00)
NonHDL: 189.57
Total CHOL/HDL Ratio: 5
Triglycerides: 212 mg/dL — ABNORMAL HIGH (ref 0.0–149.0)
VLDL: 42.4 mg/dL — ABNORMAL HIGH (ref 0.0–40.0)

## 2022-02-25 LAB — VITAMIN D 25 HYDROXY (VIT D DEFICIENCY, FRACTURES): VITD: 27.02 ng/mL — ABNORMAL LOW (ref 30.00–100.00)

## 2022-02-25 LAB — URINALYSIS, ROUTINE W REFLEX MICROSCOPIC
Bilirubin Urine: NEGATIVE
Hgb urine dipstick: NEGATIVE
Ketones, ur: NEGATIVE
Leukocytes,Ua: NEGATIVE
Nitrite: NEGATIVE
Specific Gravity, Urine: 1.01 (ref 1.000–1.030)
Total Protein, Urine: NEGATIVE
Urine Glucose: NEGATIVE
Urobilinogen, UA: 0.2 (ref 0.0–1.0)
pH: 6 (ref 5.0–8.0)

## 2022-02-25 LAB — TSH: TSH: 2.81 u[IU]/mL (ref 0.35–5.50)

## 2022-02-25 LAB — LDL CHOLESTEROL, DIRECT: Direct LDL: 152 mg/dL

## 2022-02-25 LAB — HEMOGLOBIN A1C: Hgb A1c MFr Bld: 5.7 % (ref 4.6–6.5)

## 2022-02-25 LAB — VITAMIN B12: Vitamin B-12: 640 pg/mL (ref 211–911)

## 2022-02-25 NOTE — Assessment & Plan Note (Signed)
Lab Results  Component Value Date   LDLCALC 177 (H) 01/27/2021   Stable, pt to continue current red yeast rice per pt preference, low chol diet - declines statin, zetia, repatha

## 2022-02-25 NOTE — Assessment & Plan Note (Signed)
BP Readings from Last 3 Encounters:  02/22/22 126/64  01/22/21 (!) 190/84  11/03/20 139/61   Stable currently, pt to continue medical treatment  - diet, wt control

## 2022-02-25 NOTE — Assessment & Plan Note (Signed)
Age and sex appropriate education and counseling updated with regular exercise and diet Referrals for preventative services - none needed Immunizations addressed - for flu shot today, and shingrix at pharmacy Smoking counseling  - none needed Evidence for depression or other mood disorder - none significant Most recent labs reviewed. I have personally reviewed and have noted: 1) the patient's medical and social history 2) The patient's current medications and supplements 3) The patient's height, weight, and BMI have been recorded in the chart

## 2022-02-25 NOTE — Assessment & Plan Note (Signed)
Unable to tolerate statin, declines restart, for lower chol diet

## 2022-02-25 NOTE — Assessment & Plan Note (Signed)
Last vitamin D Lab Results  Component Value Date   VD25OH 32.82 01/27/2021   Low to start oral replacement

## 2022-03-12 NOTE — Progress Notes (Unsigned)
No chief complaint on file.  History of Present Illness: 82 yo male with history of CAD s/p 3V CABG 02/10/12 (LIMA to LAD, RIMA to RCA, free radial to OM), hyperlipidemia, prostate cancer, CVA here today for cardiac follow up. He presented with unstable angina in January 2014. Cardiac cath 02/10/12 with 99% ostial left main stenosis, 99% ostial RCA stenosis. 3V CABG on 02/10/12. He has done well following his CABG. He has not tolerated statins. Echo April 2018 with normal LV function, mild AI, mild MR.   He is here today for follow up. The patient denies any chest pain, dyspnea, palpitations, lower extremity edema, orthopnea, PND, dizziness, near syncope or syncope.   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   no evidence of prostate cancer per pt 11-13-14   Allergy    BRADYCARDIA 09/20/2007   CAD (coronary artery disease)    CEREBROVASCULAR ACCIDENT, HX OF 09/20/2007   COLONIC POLYPS, HX OF 12/18/2006   DIVERTICULOSIS, COLON 09/20/2007   HYPERLIPIDEMIA 12/18/2006   PERIPHERAL VASCULAR DISEASE 09/20/2007   TIA 12/03/2007   Varicose veins     Past Surgical History:  Procedure Laterality Date   COLONOSCOPY  06/05/2009   last colon 06-05-2009 w/ patterson-polyps   COLONOSCOPY WITH PROPOFOL  11/03/2020   CORONARY ARTERY BYPASS GRAFT  02/10/2012   Procedure: CORONARY ARTERY BYPASS GRAFTING (CABG);  Surgeon: Ivin Poot, MD;  Location: [place];  Service: Open Heart Surgery;  Laterality: N/A;  CABG x three, using Bilateral mammary artery harvest and left arm radial harvest   INTRAOPERATIVE TRANSESOPHAGEAL ECHOCARDIOGRAM  02/10/2012   Procedure: INTRAOPERATIVE TRANSESOPHAGEAL ECHOCARDIOGRAM;  Surgeon: Ivin Poot, MD;  Location: [place];  Service: Open Heart Surgery;  Laterality: N/A;   LEFT HEART CATHETERIZATION WITH CORONARY ANGIOGRAM N/A 02/10/2012   Procedure: LEFT HEART CATHETERIZATION WITH CORONARY ANGIOGRAM;   Surgeon: Burnell Blanks, MD;  Location: [facility] CATH LAB;  Service: Cardiovascular;  Laterality: N/A;   POLYPECTOMY     hx TA colon polyps   RADIAL ARTERY HARVEST  02/10/2012   Procedure: RADIAL ARTERY HARVEST;  Surgeon: Ivin Poot, MD;  Location: [place];  Service: Open Heart Surgery;  Laterality: Left;    Current Outpatient Medications  Medication Sig Dispense Refill   aspirin EC 81 MG tablet Take 81 mg by mouth once a week. Swallow whole.     BEE POLLEN PO Take by mouth daily.     Multiple Vitamin (MULTIVITAMIN) tablet Take 1 tablet by mouth daily. Geritol- takes 5 days a week     Red Yeast Rice Extract (RED YEAST RICE PO) Take by mouth.     Saw Palmetto, Serenoa repens, (SAW PALMETTO PO) Take 1 tablet by mouth daily.     triamcinolone cream (KENALOG) 0.1 % Use as directed     vitamin C (ASCORBIC ACID) 500 MG tablet Take 500 mg by mouth daily. 5 days a week     Vitamin D, Ergocalciferol, (DRISDOL) 1.25 MG (50000 UT) CAPS capsule Take 1 capsule (50,000 Units total) by mouth every 7 (seven) days. 12 capsule 0   No current facility-administered medications for this visit.    Allergies  Allergen Reactions   Niacin     REACTION: hives   Simvastatin Other (See Comments)    myalgias    Social History   Socioeconomic History   Marital status: Married    Spouse name: Not on file  Number of children: Not on file   Years of education: Not on file   Highest education level: Not on file  Occupational History   Not on file  Tobacco Use   Smoking status: Never   Smokeless tobacco: Never  Vaping Use   Vaping Use: Never used  Substance and Sexual Activity   Alcohol use: No    Alcohol/week: 0.0 standard drinks of alcohol   Drug use: No   Sexual activity: Not on file  Other Topics Concern   Not on file  Social History Narrative   Not on file   Social Determinants of Health   Financial Resource Strain: Not on file  Food Insecurity: Not on file  Transportation Needs:  Not on file  Physical Activity: Not on file  Stress: Not on file  Social Connections: Not on file  Intimate Partner Violence: Not on file    Family History  Problem Relation Age of Onset   Cancer Brother    Colon cancer Brother 42       died age 49-found out 6 months before passing he had colon cancer   Heart disease Father     Review of Systems:  As stated in the HPI and otherwise negative.   There were no vitals taken for this visit.  Physical Examination: General: Well developed, well nourished, NAD  HEENT: OP clear, mucus membranes moist  SKIN: warm, dry. No rashes. Neuro: No focal deficits  Musculoskeletal: Muscle strength 5/5 all ext  Psychiatric: Mood and affect normal  Neck: No JVD, no carotid bruits, no thyromegaly, no lymphadenopathy.  Lungs:Clear bilaterally, no wheezes, rhonci, crackles Cardiovascular: Regular rate and rhythm. No murmurs, gallops or rubs. Abdomen:Soft. Bowel sounds present. Non-tender.  Extremities: No lower extremity edema. Pulses are 2 + in the bilateral DP/PT.  Echo 05/16/16: - Left ventricle: The cavity size was normal. There was mild focal   basal hypertrophy of the septum. Systolic function was normal.   The estimated ejection fraction was in the range of 55% to 60%.   Wall motion was normal; there were no regional wall motion   abnormalities. Features are consistent with a pseudonormal left   ventricular filling pattern, with concomitant abnormal relaxation   and increased filling pressure (grade 2 diastolic dysfunction). - Aortic valve: There was mild regurgitation. - Aortic root: The aortic root was mildly dilated. - Mitral valve: There was mild regurgitation. - Left atrium: The atrium was mildly dilated. - Pulmonary arteries: Systolic pressure was mildly increased. PA   peak pressure: 36 mm Hg (S).   Cardiac cath 02/10/12:  Left main: Distal 99% stenosis.  Left Anterior Descending Artery: Large caliber vessel that courses to the  apex. There is calcification in the proximal vessel. The proximal vessel has a 90% stenosis. The mid vessel has serial 50% stenoses. The distal vessel has mild plaque disease. The diagonal branch is small to moderate sized and has mild plaque disease.  Circumflex Artery: Large caliber, calcified vessel with 30% stenosis in the proximal vessel. The obtuse marginal is moderate in caliber and has 30% serial stenoses.  Right Coronary Artery: Large, dominant vessel with 99% ostial stenosis. The mid vessel has serial 30% stenoses with moderate calcification. The distal vessel has a 40-50% stenosis just before the bifurcation. The PDA and posterolateral branches are moderate in caliber with diffuse plaque but no focally obstructive lesions.  Left Ventricular Angiogram: LVEF=60-65%.   EKG:  EKG is *** ordered today. The ekg ordered today demonstrates  Recent Labs: 02/25/2022: ALT 14; BUN 13; Creatinine, Ser 1.07; Hemoglobin 15.4; Platelets 228.0; Potassium 3.9; Sodium 137; TSH 2.81   Lipid Panel    Component Value Date/Time   CHOL 239 (H) 02/25/2022 1503   CHOL 173 09/06/2019 0817   TRIG 212.0 (H) 02/25/2022 1503   TRIG 61 12/16/2005 0753   HDL 49.20 02/25/2022 1503   HDL 57 09/06/2019 0817   CHOLHDL 5 02/25/2022 1503   VLDL 42.4 (H) 02/25/2022 1503   LDLCALC 177 (H) 01/27/2021 0811   LDLCALC 106 (H) 09/06/2019 0817   LDLDIRECT 152.0 02/25/2022 1503     Wt Readings from Last 3 Encounters:  02/22/22 85.3 kg  01/22/21 83.9 kg  11/03/20 83.5 kg    Assessment and Plan:   1. CAD without angina: No chest pain. LV function normal by echo in 2018. He is on ASA but has not tolerated beta blockers due to bradycardia. Will continue ASA. He is statin intolerant  2. Hyperlipidemia: He does not tolerate statins. Last LDL 152 in February 2024. We have discussed referral to the lipid clinic to discuss Repatha but he does not wish to do this.    3. HTN: BP is controlled at home. He reports white coat  HTN  4. Lower extremity edema: This is consistent with dependent edema.   Labs/ tests ordered today include:  No orders of the defined types were placed in this encounter.  Disposition:   F/U with me in 12  months   Signed, Lauree Chandler, MD 03/12/2022 9:43 AM    [address], [place], [place]  [id number] Phone: [phone]; Fax: [phone]

## 2022-03-14 ENCOUNTER — Ambulatory Visit: Payer: PPO | Attending: Cardiovascular Disease | Admitting: Cardiovascular Disease

## 2022-03-14 ENCOUNTER — Encounter: Payer: Self-pay | Admitting: Cardiovascular Disease

## 2022-03-14 VITALS — BP 156/68 | HR 62 | Ht 70.0 in | Wt 187.4 lb

## 2022-03-14 DIAGNOSIS — I2510 Atherosclerotic heart disease of native coronary artery without angina pectoris: Secondary | ICD-10-CM

## 2022-03-14 DIAGNOSIS — E7800 Pure hypercholesterolemia, unspecified: Secondary | ICD-10-CM

## 2022-03-14 DIAGNOSIS — I10 Essential (primary) hypertension: Secondary | ICD-10-CM | POA: Diagnosis not present

## 2022-03-14 NOTE — Patient Instructions (Signed)
Medication Instructions:  No changes *If you need a refill on your cardiac medications before your next appointment, please call your pharmacy*   Lab Work: none If you have labs (blood work) drawn today and your tests are completely normal, you will receive your results only by: [place] (if you have MyChart) OR A paper copy in the mail If you have any lab test that is abnormal or we need to change your treatment, we will call you to review the results.   Testing/Procedures: none   Follow-Up: At [facility], you and your health needs are our priority.  As part of our continuing mission to provide you with exceptional heart care, we have created designated Provider Care Teams.  These Care Teams include your primary Cardiologist (physician) and Advanced Practice Providers (APPs -  Physician Assistants and Nurse Practitioners) who all work together to provide you with the care you need, when you need it.  We recommend signing up for the patient portal called "MyChart".  Sign up information is provided on this After Visit Summary.  MyChart is used to connect with patients for Virtual Visits (Telemedicine).  Patients are able to view lab/test results, encounter notes, upcoming appointments, etc.  Non-urgent messages can be sent to your provider as well.   To learn more about what you can do with MyChart, go to [email or web address].    Your next appointment:   12 month(s)  Provider:   Lauree Chandler, MD

## 2022-03-28 ENCOUNTER — Telehealth: Payer: Self-pay | Admitting: Internal Medicine

## 2022-03-28 MED ORDER — ROSUVASTATIN CALCIUM 20 MG PO TABS: 20.0000 mg | ORAL_TABLET | Freq: Every day | ORAL | 3 refills | Status: DC

## 2022-03-28 NOTE — Telephone Encounter (Signed)
Ok sure if he is willing -   Ok for crestor 20 mg per day - done erx

## 2022-03-28 NOTE — Telephone Encounter (Signed)
Please advise for cholesterol medication discussed at last visit

## 2022-03-28 NOTE — Telephone Encounter (Signed)
Patient's wife called stating that Dr. Jenny Reichmann was suppose to put her husband on a cholesterol medication but they haven't received anything yet or a call from the pharmacy. Patient stated he doesn't know what the name of the medication was suppose to be but that he remembers that Dr.John was suppose to prescribe him something new. Patient would like a call back from Dr. Gwynn Burly nurse. Best callback number is [phone].

## 2022-03-29 NOTE — Telephone Encounter (Signed)
Patient informed of cholesterol medication sent to pharmacy

## 2022-04-05 ENCOUNTER — Ambulatory Visit: Payer: PPO

## 2022-04-05 ENCOUNTER — Telehealth: Payer: Self-pay

## 2022-04-05 NOTE — Telephone Encounter (Signed)
Contacted Donnelly Stager to schedule their annual wellness visit. Appointment made for 04/06/22.  Norton Blizzard, [place] ([facility])  [facility] [phone]

## 2022-04-06 ENCOUNTER — Ambulatory Visit (INDEPENDENT_AMBULATORY_CARE_PROVIDER_SITE_OTHER): Payer: PPO

## 2022-04-06 VITALS — Ht 70.0 in | Wt 187.0 lb

## 2022-04-06 DIAGNOSIS — Z0000 Encounter for general adult medical examination without abnormal findings: Secondary | ICD-10-CM | POA: Diagnosis not present

## 2022-04-06 NOTE — Progress Notes (Signed)
I connected with  [name] [name] on 04/06/22 by a audio enabled telemedicine application and verified that I am speaking with the correct person using two identifiers.  Patient Location: Other:  office  Provider Location: Office/Clinic  I discussed the limitations of evaluation and management by telemedicine. The patient expressed understanding and agreed to proceed.  Subjective:   [name] [name] is a 82 y.o. male who presents for an Initial Medicare Annual Wellness Visit.  Review of Systems     Cardiac Risk Factors include: advanced age (>74men, >69 women);dyslipidemia;family history of premature cardiovascular disease;male gender     Objective:    Today's Vitals   04/06/22 1048  Weight: 187 lb (84.8 kg)  Height: 5\' 10"  (1.778 m)  PainSc: 0-No pain   Body mass index is 26.83 kg/m.     04/06/2022   11:06 AM 05/09/2017    8:49 AM 11/17/2016   11:36 AM 10/17/2016    2:33 PM 11/21/2014   12:39 PM 11/13/2014   10:11 AM 04/28/2014    2:10 PM  Advanced Directives  Does Patient Have a Medical Advance Directive? Yes Yes Yes Yes Yes Yes Yes  Type of Advance Directive Living will [employer];Out of facility DNR (pink MOST or yellow form) Living will;Healthcare Power of [place];Living will  [place];Living will [place];Living will  Copy of [place] in Chart?   No - copy requested  No - copy requested  No - copy requested    Current Medications (verified) Outpatient Encounter Medications as of 04/06/2022  Medication Sig   BEE POLLEN PO Take by mouth daily.   Multiple Vitamin (MULTIVITAMIN) tablet Take 1 tablet by mouth daily. Geritol- takes 5 days a week   Red Yeast Rice Extract (RED YEAST RICE PO) Take by mouth.   rosuvastatin (CRESTOR) 20 MG tablet Take 1 tablet (20 mg total) by mouth daily.   Saw Palmetto, Serenoa repens, (SAW PALMETTO PO) Take 1 tablet by mouth daily.    triamcinolone cream (KENALOG) 0.1 % Use as directed   vitamin C (ASCORBIC ACID) 500 MG tablet Take 500 mg by mouth daily. 5 days a week   No facility-administered encounter medications on file as of 04/06/2022.    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   no evidence of prostate cancer per pt 11-13-14   Allergy    BRADYCARDIA 09/20/2007   CAD (coronary artery disease)    CEREBROVASCULAR ACCIDENT, HX OF 09/20/2007   COLONIC POLYPS, HX OF 12/18/2006   DIVERTICULOSIS, COLON 09/20/2007   HYPERLIPIDEMIA 12/18/2006   PERIPHERAL VASCULAR DISEASE 09/20/2007   TIA 12/03/2007   Varicose veins    Past Surgical History:  Procedure Laterality Date   COLONOSCOPY  06/05/2009   last colon 06-05-2009 w/ patterson-polyps   COLONOSCOPY WITH PROPOFOL  11/03/2020   CORONARY ARTERY BYPASS GRAFT  02/10/2012   Procedure: CORONARY ARTERY BYPASS GRAFTING (CABG);  Surgeon: Ivin Poot, MD;  Location: [place];  Service: Open Heart Surgery;  Laterality: N/A;  CABG x three, using Bilateral mammary artery harvest and left arm radial harvest   INTRAOPERATIVE TRANSESOPHAGEAL ECHOCARDIOGRAM  02/10/2012   Procedure: INTRAOPERATIVE TRANSESOPHAGEAL ECHOCARDIOGRAM;  Surgeon: Ivin Poot, MD;  Location: [place];  Service: Open Heart Surgery;  Laterality: N/A;   LEFT HEART CATHETERIZATION WITH CORONARY ANGIOGRAM N/A 02/10/2012   Procedure: LEFT HEART CATHETERIZATION WITH CORONARY  ANGIOGRAM;  Surgeon: Burnell Blanks, MD;  Location[facility] CATH LAB;  Service: Cardiovascular;  Laterality: N/A;   POLYPECTOMY     hx TA colon polyps   RADIAL ARTERY HARVEST  02/10/2012   Procedure: RADIAL ARTERY HARVEST;  Surgeon: Ivin Poot, MD;  Location: [place];  Service: Open Heart Surgery;  Laterality: Left;   Family History  Problem Relation Age of Onset   Cancer Brother    Colon cancer Brother 42       died age 52-found out 6 months before  passing he had colon cancer   Heart disease Father    Social History   Socioeconomic History   Marital status: Married    Spouse name: Not on file   Number of children: Not on file   Years of education: Not on file   Highest education level: Not on file  Occupational History   Not on file  Tobacco Use   Smoking status: Never   Smokeless tobacco: Never  Vaping Use   Vaping Use: Never used  Substance and Sexual Activity   Alcohol use: No    Alcohol/week: 0.0 standard drinks of alcohol   Drug use: No   Sexual activity: Not on file  Other Topics Concern   Not on file  Social History Narrative   Not on file   Social Determinants of Health   Financial Resource Strain: Low Risk  (04/06/2022)   Overall Financial Resource Strain (CARDIA)    Difficulty of Paying Living Expenses: Not hard at all  Food Insecurity: No Food Insecurity (04/06/2022)   Hunger Vital Sign    Worried About Running Out of Food in the Last Year: Never true    [place] in the Last Year: Never true  Transportation Needs: No Transportation Needs (04/06/2022)   PRAPARE - Hydrologist (Medical): No    Lack of Transportation (Non-Medical): No  Physical Activity: Sufficiently Active (04/06/2022)   Exercise Vital Sign    Days of Exercise per Week: 5 days    Minutes of Exercise per Session: 30 min  Stress: No Stress Concern Present (04/06/2022)   [place]    Feeling of Stress : Not at all  Social Connections: [place] (04/06/2022)   Social Connection and Isolation Panel [NHANES]    Frequency of Communication with Friends and Family: More than three times a week    Frequency of Social Gatherings with Friends and Family: More than three times a week    Attends Religious Services: More than 4 times per year    Active Member of [employer] or Organizations: Yes    Attends Music therapist: More than 4  times per year    Marital Status: Married    Tobacco Counseling Counseling given: Not Answered   Clinical Intake:  Pre-visit preparation completed: Yes  Pain : No/denies pain Pain Score: 0-No pain     BMI - recorded: 26.83 Nutritional Status: BMI 25 -29 Overweight Nutritional Risks: None Diabetes: No  How often do you need to have someone help you when you read instructions, pamphlets, or other written materials from your doctor or pharmacy?: 1 - Never What is the last grade level you completed in school?: HSG  Diabetic? No  Interpreter Needed?: No  Information entered by :: Lisette Abu, LPN.   Activities of Daily Living    04/06/2022   11:07 AM  In your present state  of health, do you have any difficulty performing the following activities:  Hearing? 0  Vision? 0  Difficulty concentrating or making decisions? 0  Walking or climbing stairs? 0  Dressing or bathing? 0  Doing errands, shopping? 0  Preparing Food and eating ? N  Using the Toilet? N  In the past six months, have you accidently leaked urine? N  Do you have problems with loss of bowel control? N  Managing your Medications? N  Managing your Finances? N  Housekeeping or managing your Housekeeping? N    Patient Care Team: Biagio Borg, MD as PCP - General Angelena Form Annita Brod, MD as PCP - Cardiology (Cardiology) Hillary Bow, MD as Attending Physician (Cardiology) [place], Heather, [place] as Consulting Physician (Optometry) Lucita Ferrara, Peterson Ao, MD as Consulting Physician (Ophthalmology)  Indicate any recent Medical Services you may have received from other than [facility] providers in the past year (date may be approximate).     Assessment:   This is a routine wellness examination for [place].  Hearing/Vision screen Hearing Screening - Comments:: Patient wears hearing aids. Vision Screening - Comments:: Wears rx glasses - up to date with routine eye exams with [facility] and cataracts removed  by Dr. Vevelyn Royals   Dietary issues and exercise activities discussed: Current Exercise Habits: Home exercise routine, Type of exercise: walking;treadmill;stretching;exercise ball;strength training/weights, Time (Minutes): 30, Frequency (Times/Week): 5, Weekly Exercise (Minutes/Week): 150, Intensity: Moderate, Exercise limited by: None identified   Goals Addressed             This Visit's Progress    My goal for 2024 is to stay healthy and independent.        Depression Screen    04/06/2022   11:05 AM 02/22/2022    2:10 PM 01/22/2021    8:23 AM 01/22/2021    8:08 AM 11/15/2018    8:14 AM 03/21/2017   10:16 AM 12/25/2015   10:39 AM  PHQ 2/9 Scores  PHQ - 2 Score 0 0 0 0 0 0 0  PHQ- 9 Score 0 0         Fall Risk    04/06/2022   11:07 AM 02/22/2022    2:10 PM 01/22/2021    8:23 AM 01/22/2021    8:08 AM 11/15/2018    8:14 AM  Fall Risk   Falls in the past year? 0 0 0 0 0  Number falls in past yr: 0 0 0 0   Injury with Fall? 0 0 0 0   Risk for fall due to : No Fall Risks No Fall Risks     Follow up Falls prevention discussed Falls evaluation completed       FALL RISK PREVENTION PERTAINING TO THE HOME:  Any stairs in or around the home? Yes  If so, are there any without handrails? No  Home free of loose throw rugs in walkways, pet beds, electrical cords, etc? Yes  Adequate lighting in your home to reduce risk of falls? Yes   ASSISTIVE DEVICES UTILIZED TO PREVENT FALLS:  Life alert? No  Use of a cane, [place] or w/c? No  Grab bars in the bathroom? Yes  Shower chair or bench in shower? No  Elevated toilet seat or a handicapped toilet? Yes   TIMED UP AND GO:  Was the test performed? No . Telephonic Visit  Cognitive Function:        04/06/2022   11:07 AM  6CIT Screen  What Year? 0 points  What  month? 0 points  What time? 0 points  Count back from 20 0 points  Months in reverse 0 points  Repeat phrase 0 points  Total Score 0 points     Immunizations Immunization History  Administered Date(s) Administered   Fluad Quad(high Dose 65+) 02/22/2022   Influenza Split 12/23/2010   Influenza Whole 11/16/2007, 10/23/2009   Influenza, High Dose Seasonal PF 09/23/2014, 12/25/2015, 11/02/2017, 10/31/2018   Influenza-Unspecified 10/17/2013, 10/17/2016, 12/28/2020   PFIZER Comirnaty(Gray Top)Covid-19 Tri-Sucrose Vaccine 11/25/2019, 01/27/2020   Pneumococcal Conjugate-13 03/21/2017   Pneumococcal Polysaccharide-23 09/23/2014   Td 01/18/1996, 01/18/2004, 09/23/2014    TDAP status: Up to date  Flu Vaccine status: Up to date  Pneumococcal vaccine status: Up to date  Covid-19 vaccine status: Completed vaccines  Qualifies for Shingles Vaccine? Yes   Zostavax completed No   Shingrix Completed?: No.    Education has been provided regarding the importance of this vaccine. Patient has been advised to call insurance company to determine out of pocket expense if they have not yet received this vaccine. Advised may also receive vaccine at local pharmacy or Health Dept. Verbalized acceptance and understanding.  Screening Tests Health Maintenance  Topic Date Due   COVID-19 Vaccine (3 - Pfizer risk series) 02/24/2020   Zoster Vaccines- Shingrix (1 of 2) 05/23/2022 (Originally 10/17/1959)   Medicare Annual Wellness (AWV)  04/06/2023   DTaP/Tdap/Td (4 - Tdap) 09/22/2024   Pneumonia Vaccine 30+ Years old  Completed   INFLUENZA VACCINE  Completed   HPV VACCINES  Aged Out   COLONOSCOPY (Pts 45-61yrs Insurance coverage will need to be confirmed)  Discontinued    Health Maintenance  Health Maintenance Due  Topic Date Due   COVID-19 Vaccine (3 - Pfizer risk series) 02/24/2020    Colorectal cancer screening: No longer required.   Lung Cancer Screening: (Low Dose CT Chest recommended if Age 67-80 years, 30 pack-year currently smoking OR have quit w/in 15years.) does not qualify.   Lung Cancer Screening Referral: no  Additional  Screening:  Hepatitis C Screening: does not qualify; Completed: no  Vision Screening: Recommended annual ophthalmology exams for early detection of glaucoma and other disorders of the eye. Is the patient up to date with their annual eye exam?  Yes  Who is the provider or what is the name of the office in which the patient attends annual eye exams? [facility] If pt is not established with a provider, would they like to be referred to a provider to establish care? No .   Dental Screening: Recommended annual dental exams for proper oral hygiene  Community Resource Referral / Chronic Care Management: CRR required this visit?  No   CCM required this visit?  No      Plan:     I have personally reviewed and noted the following in the patient's chart:   Medical and social history Use of alcohol, tobacco or illicit drugs  Current medications and supplements including opioid prescriptions. Patient is not currently taking opioid prescriptions. Functional ability and status Nutritional status Physical activity Advanced directives List of other physicians Hospitalizations, surgeries, and ER visits in previous 12 months Vitals Screenings to include cognitive, depression, and falls Referrals and appointments  In addition, I have reviewed and discussed with patient certain preventive protocols, quality metrics, and best practice recommendations. A written personalized care plan for preventive services as well as general preventive health recommendations were provided to patient.     Sheral Flow, [place]   [id number]  Nurse Notes: Normal cognitive status assessed by direct observation by this Nurse Health Advisor. No abnormalities found.

## 2022-04-06 NOTE — Patient Instructions (Signed)
Mr. [name] [name] , Thank you for taking time to come for your Medicare Wellness Visit. I appreciate your ongoing commitment to your health goals. Please review the following plan we discussed and let me know if I can assist you in the future.   These are the goals we discussed:  Goals      My goal for 2024 is to stay healthy and independent.        This is a list of the screening recommended for you and due dates:  Health Maintenance  Topic Date Due   COVID-19 Vaccine (3 - Pfizer risk series) 02/24/2020   Zoster (Shingles) Vaccine (1 of 2) 05/23/2022*   Medicare Annual Wellness Visit  04/06/2023   DTaP/Tdap/Td vaccine (4 - Tdap) 09/22/2024   Pneumonia Vaccine  Completed   Flu Shot  Completed   HPV Vaccine  Aged Out   Colon Cancer Screening  Discontinued  *Topic was postponed. The date shown is not the original due date.    Advanced directives: Yes  Conditions/risks identified: Yes; Maintain my health and independence.  Next appointment: Follow up in one year for your annual wellness visit.   Preventive Care 25 Years and Older, Male  Preventive care refers to lifestyle choices and visits with your health care provider that can promote health and wellness. What does preventive care include? A yearly physical exam. This is also called an annual well check. Dental exams once or twice a year. Routine eye exams. Ask your health care provider how often you should have your eyes checked. Personal lifestyle choices, including: Daily care of your teeth and gums. Regular physical activity. Eating a healthy diet. Avoiding tobacco and drug use. Limiting alcohol use. Practicing safe sex. Taking low doses of aspirin every day. Taking vitamin and mineral supplements as recommended by your health care provider. What happens during an annual well check? The services and screenings done by your health care provider during your annual well check will depend on your age, overall health, lifestyle  risk factors, and family history of disease. Counseling  Your health care provider may ask you questions about your: Alcohol use. Tobacco use. Drug use. Emotional well-being. Home and relationship well-being. Sexual activity. Eating habits. History of falls. Memory and ability to understand (cognition). Work and work Statistician. Screening  You may have the following tests or measurements: Height, weight, and BMI. Blood pressure. Lipid and cholesterol levels. These may be checked every 5 years, or more frequently if you are over 66 years old. Skin check. Lung cancer screening. You may have this screening every year starting at age 28 if you have a 30-pack-year history of smoking and currently smoke or have quit within the past 15 years. Fecal occult blood test (FOBT) of the stool. You may have this test every year starting at age 72. Flexible sigmoidoscopy or colonoscopy. You may have a sigmoidoscopy every 5 years or a colonoscopy every 10 years starting at age 55. Prostate cancer screening. Recommendations will vary depending on your family history and other risks. Hepatitis C blood test. Hepatitis B blood test. Sexually transmitted disease (STD) testing. Diabetes screening. This is done by checking your blood sugar (glucose) after you have not eaten for a while (fasting). You may have this done every 1-3 years. Abdominal aortic aneurysm (AAA) screening. You may need this if you are a current or former smoker. Osteoporosis. You may be screened starting at age 75 if you are at high risk. Talk with your health care provider about  your test results, treatment options, and if necessary, the need for more tests. Vaccines  Your health care provider may recommend certain vaccines, such as: Influenza vaccine. This is recommended every year. Tetanus, diphtheria, and acellular pertussis (Tdap, Td) vaccine. You may need a Td booster every 10 years. Zoster vaccine. You may need this after age  31. Pneumococcal 13-valent conjugate (PCV13) vaccine. One dose is recommended after age 45. Pneumococcal polysaccharide (PPSV23) vaccine. One dose is recommended after age 35. Talk to your health care provider about which screenings and vaccines you need and how often you need them. This information is not intended to replace advice given to you by your health care provider. Make sure you discuss any questions you have with your health care provider. Document Released: 01/30/2015 Document Revised: 09/23/2015 Document Reviewed: 11/04/2014 Elsevier Interactive Patient Education  2017 [place] Prevention in the Home Falls can cause injuries. They can happen to people of all ages. There are many things you can do to make your home safe and to help prevent falls. What can I do on the outside of my home? Regularly fix the edges of walkways and driveways and fix any cracks. Remove anything that might make you trip as you walk through a door, such as a raised step or threshold. Trim any bushes or trees on the path to your home. Use bright outdoor lighting. Clear any walking paths of anything that might make someone trip, such as rocks or tools. Regularly check to see if handrails are loose or broken. Make sure that both sides of any steps have handrails. Any raised decks and porches should have guardrails on the edges. Have any leaves, snow, or ice cleared regularly. Use sand or salt on walking paths during winter. Clean up any spills in your garage right away. This includes oil or grease spills. What can I do in the bathroom? Use night lights. Install grab bars by the toilet and in the tub and shower. Do not use towel bars as grab bars. Use non-skid mats or decals in the tub or shower. If you need to sit down in the shower, use a plastic, non-slip stool. Keep the floor dry. Clean up any water that spills on the floor as soon as it happens. Remove soap buildup in the tub or shower  regularly. Attach bath mats securely with double-sided non-slip rug tape. Do not have throw rugs and other things on the floor that can make you trip. What can I do in the bedroom? Use night lights. Make sure that you have a light by your bed that is easy to reach. Do not use any sheets or blankets that are too big for your bed. They should not hang down onto the floor. Have a firm chair that has side arms. You can use this for support while you get dressed. Do not have throw rugs and other things on the floor that can make you trip. What can I do in the kitchen? Clean up any spills right away. Avoid walking on wet floors. Keep items that you use a lot in easy-to-reach places. If you need to reach something above you, use a strong step stool that has a grab bar. Keep electrical cords out of the way. Do not use floor polish or wax that makes floors slippery. If you must use wax, use non-skid floor wax. Do not have throw rugs and other things on the floor that can make you trip. What can I do with  my stairs? Do not leave any items on the stairs. Make sure that there are handrails on both sides of the stairs and use them. Fix handrails that are broken or loose. Make sure that handrails are as long as the stairways. Check any carpeting to make sure that it is firmly attached to the stairs. Fix any carpet that is loose or worn. Avoid having throw rugs at the top or bottom of the stairs. If you do have throw rugs, attach them to the floor with carpet tape. Make sure that you have a light switch at the top of the stairs and the bottom of the stairs. If you do not have them, ask someone to add them for you. What else can I do to help prevent falls? Wear shoes that: Do not have high heels. Have rubber bottoms. Are comfortable and fit you well. Are closed at the toe. Do not wear sandals. If you use a stepladder: Make sure that it is fully opened. Do not climb a closed stepladder. Make sure that  both sides of the stepladder are locked into place. Ask someone to hold it for you, if possible. Clearly mark and make sure that you can see: Any grab bars or handrails. First and last steps. Where the edge of each step is. Use tools that help you move around (mobility aids) if they are needed. These include: Canes. Walkers. Scooters. Crutches. Turn on the lights when you go into a dark area. Replace any light bulbs as soon as they burn out. Set up your furniture so you have a clear path. Avoid moving your furniture around. If any of your floors are uneven, fix them. If there are any pets around you, be aware of where they are. Review your medicines with your doctor. Some medicines can make you feel dizzy. This can increase your chance of falling. Ask your doctor what other things that you can do to help prevent falls. This information is not intended to replace advice given to you by your health care provider. Make sure you discuss any questions you have with your health care provider. Document Released: 10/30/2008 Document Revised: 06/11/2015 Document Reviewed: 02/07/2014 Elsevier Interactive Patient Education  2017 [employer].

## 2022-04-12 DIAGNOSIS — H40013 Open angle with borderline findings, low risk, bilateral: Secondary | ICD-10-CM | POA: Diagnosis not present

## 2022-04-27 DIAGNOSIS — Z961 Presence of intraocular lens: Secondary | ICD-10-CM | POA: Diagnosis not present

## 2022-04-27 DIAGNOSIS — H04123 Dry eye syndrome of bilateral lacrimal glands: Secondary | ICD-10-CM | POA: Diagnosis not present

## 2022-04-27 DIAGNOSIS — H40013 Open angle with borderline findings, low risk, bilateral: Secondary | ICD-10-CM | POA: Diagnosis not present

## 2022-04-28 DIAGNOSIS — H40023 Open angle with borderline findings, high risk, bilateral: Secondary | ICD-10-CM | POA: Diagnosis not present

## 2022-05-24 ENCOUNTER — Other Ambulatory Visit: Payer: Self-pay | Admitting: *Deleted

## 2022-05-24 DIAGNOSIS — I83891 Varicose veins of right lower extremities with other complications: Secondary | ICD-10-CM

## 2022-06-01 ENCOUNTER — Ambulatory Visit: Payer: PPO | Admitting: Vascular Surgery

## 2022-06-01 ENCOUNTER — Encounter: Payer: Self-pay | Admitting: Vascular Surgery

## 2022-06-01 ENCOUNTER — Ambulatory Visit (HOSPITAL_COMMUNITY)
Admission: RE | Admit: 2022-06-01 | Discharge: 2022-06-01 | Disposition: A | Discharge status: Home / Self Care | Payer: PPO | Source: Ambulatory Visit | Attending: Vascular Surgery | Admitting: Vascular Surgery

## 2022-06-01 VITALS — BP 204/86 | HR 55 | Temp 98.4°F | Resp 18 | Ht 70.0 in | Wt 184.5 lb

## 2022-06-01 DIAGNOSIS — I83813 Varicose veins of bilateral lower extremities with pain: Secondary | ICD-10-CM

## 2022-06-01 DIAGNOSIS — I872 Venous insufficiency (chronic) (peripheral): Secondary | ICD-10-CM | POA: Diagnosis not present

## 2022-06-01 DIAGNOSIS — I83891 Varicose veins of right lower extremities with other complications: Secondary | ICD-10-CM | POA: Insufficient documentation

## 2022-06-01 NOTE — Progress Notes (Signed)
ASSESSMENT & PLAN   CHRONIC VENOUS INSUFFICIENCY: This patient has had bilateral laser ablations of the great saphenous veins.  On his duplex scan today he has significant deep venous reflux bilaterally but no significant superficial venous reflux.  He does have lots of varicose veins in both legs but currently is not having symptoms.  We have again discussed the importance of trying to avoid prolonged sitting and standing.  We have also discussed the importance of exercise.  He stays very busy taking care of his yard and taking care of his wife.  We discussed the importance of daily leg elevation and the proper positioning for this.  He will continue to wear his compression stockings.  Currently I would not recommend excision of his varicose veins as they are not causing significant symptoms.  He does have an area on the right leg in the medial calf where he is concerned that he would have bleeding and we have instructed him on how to address this if he did have a bleeding problem.  He also knows to keep skin well-lubricated.  I will see him back as needed.  REASON FOR CONSULT:    Varicose veins the patient is self-referred.  HPI:   [name] [name] is a 82 y.o. male who I last saw back in 2020.  He has undergone previous laser ablation of both great saphenous veins in the past.  I saw the patient back in 2020 he was then lost to follow-up.  He presents today with varicose veins of both lower extremities.  His main concern was an area in his medial right calf where there are some small veins which protrude above the skin and he feels we put him at risk for bleeding.  He does not have any significant pain associated with his venous disease.  He denies aching pain or heaviness in his legs.  Has not had significant swelling.  He has no previous history of DVT.  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   no evidence of prostate cancer per pt 11-13-14   Allergy     BRADYCARDIA 09/20/2007   CAD (coronary artery disease)    CEREBROVASCULAR ACCIDENT, HX OF 09/20/2007   COLONIC POLYPS, HX OF 12/18/2006   DIVERTICULOSIS, COLON 09/20/2007   HYPERLIPIDEMIA 12/18/2006   PERIPHERAL VASCULAR DISEASE 09/20/2007   TIA 12/03/2007   Varicose veins     Family History  Problem Relation Age of Onset   Cancer Brother    Colon cancer Brother 23       died age 51-found out 6 months before passing he had colon cancer   Heart disease Father     SOCIAL HISTORY: Social History   Tobacco Use   Smoking status: Never   Smokeless tobacco: Never  Substance Use Topics   Alcohol use: No    Alcohol/week: 0.0 standard drinks of alcohol    Allergies  Allergen Reactions   Niacin     REACTION: hives   Simvastatin Other (See Comments)    myalgias    Current Outpatient Medications  Medication Sig Dispense Refill   BEE POLLEN PO Take by mouth daily.     Multiple Vitamin (MULTIVITAMIN) tablet Take 1 tablet by mouth daily. Geritol- takes 5 days a week     Red Yeast Rice Extract (RED YEAST RICE PO) Take by mouth.     rosuvastatin (CRESTOR) 20 MG tablet Take 1 tablet (20 mg total) by mouth daily. 90 tablet 3  Saw Palmetto, Serenoa repens, (SAW PALMETTO PO) Take 1 tablet by mouth daily.     triamcinolone cream (KENALOG) 0.1 % Use as directed     vitamin C (ASCORBIC ACID) 500 MG tablet Take 500 mg by mouth daily. 5 days a week     No current facility-administered medications for this visit.    REVIEW OF SYSTEMS:  [X]  denotes positive finding, [ ]  denotes negative finding Cardiac  Comments:  Chest pain or chest pressure:    Shortness of breath upon exertion:    Short of breath when lying flat:    Irregular heart rhythm:        Vascular    Pain in calf, thigh, or hip brought on by ambulation:    Pain in feet at night that wakes you up from your sleep:     Blood clot in your veins:    Leg swelling:         Pulmonary    Oxygen at home:    Productive cough:      Wheezing:         Neurologic    Sudden weakness in arms or legs:     Sudden numbness in arms or legs:     Sudden onset of difficulty speaking or slurred speech:    Temporary loss of vision in one eye:     Problems with dizziness:         Gastrointestinal    Blood in stool:     Vomited blood:         Genitourinary    Burning when urinating:     Blood in urine:        Psychiatric    Major depression:         Hematologic    Bleeding problems:    Problems with blood clotting too easily:        Skin    Rashes or ulcers:        Constitutional    Fever or chills:    -  PHYSICAL EXAM:   Vitals:   06/01/22 1418  BP: (!) 204/86  Pulse: (!) 55  Resp: 18  Temp: 98.4 F (36.9 C)  TempSrc: Temporal  SpO2: 100%  Weight: 184 lb 8 oz (83.7 kg)  Height: 5\' 10"  (1.778 m)   Body mass index is 26.47 kg/m. GENERAL: The patient is a well-nourished male, in no acute distress. The vital signs are documented above. CARDIAC: There is a regular rate and rhythm.  VASCULAR: I do not detect carotid bruits. He has a palpable right dorsalis pedis pulse and a left posterior tibial pulse. He has lots of varicose veins on both legs as documented in the photographs below.             PULMONARY: There is good air exchange bilaterally without wheezing or rales. ABDOMEN: Soft and non-tender with normal pitched bowel sounds.  MUSCULOSKELETAL: There are no major deformities. NEUROLOGIC: No focal weakness or paresthesias are detected. SKIN: There are no ulcers or rashes noted. PSYCHIATRIC: The patient has a normal affect.  DATA:    VENOUS DUPLEX: I have independently interpreted his venous duplex scan today.  On the right side, there is no evidence of DVT.  There was deep venous reflux involving the common femoral vein, femoral vein, and popliteal vein.  There is a segment of superficial venous reflux in the right great saphenous vein from the proximal calf to the mid calf.  Above  that the vein  has been ablated successfully.  There is no significant reflux in the small saphenous vein.  The results of the study can be summarized in the diagram below.    On the left side, there is no evidence of DVT.  There is deep venous reflux in the common femoral vein, femoral vein, and popliteal vein.  The left great saphenous vein has been previously ablated.  There is no other significant superficial venous reflux.  The results of this study can be summarized in the diagram below.      Waverly [facility]

## 2022-06-06 DIAGNOSIS — H401122 Primary open-angle glaucoma, left eye, moderate stage: Secondary | ICD-10-CM | POA: Diagnosis not present

## 2022-08-10 DIAGNOSIS — H401111 Primary open-angle glaucoma, right eye, mild stage: Secondary | ICD-10-CM | POA: Diagnosis not present

## 2022-08-10 DIAGNOSIS — H401122 Primary open-angle glaucoma, left eye, moderate stage: Secondary | ICD-10-CM | POA: Diagnosis not present

## 2022-08-15 DIAGNOSIS — X32XXXD Exposure to sunlight, subsequent encounter: Secondary | ICD-10-CM | POA: Diagnosis not present

## 2022-08-15 DIAGNOSIS — L570 Actinic keratosis: Secondary | ICD-10-CM | POA: Diagnosis not present

## 2022-08-15 DIAGNOSIS — L821 Other seborrheic keratosis: Secondary | ICD-10-CM | POA: Diagnosis not present

## 2022-08-15 DIAGNOSIS — L820 Inflamed seborrheic keratosis: Secondary | ICD-10-CM | POA: Diagnosis not present

## 2022-09-08 DIAGNOSIS — H401111 Primary open-angle glaucoma, right eye, mild stage: Secondary | ICD-10-CM | POA: Diagnosis not present

## 2022-11-30 DIAGNOSIS — H401122 Primary open-angle glaucoma, left eye, moderate stage: Secondary | ICD-10-CM | POA: Diagnosis not present

## 2022-11-30 DIAGNOSIS — H04123 Dry eye syndrome of bilateral lacrimal glands: Secondary | ICD-10-CM | POA: Diagnosis not present

## 2022-11-30 DIAGNOSIS — H31092 Other chorioretinal scars, left eye: Secondary | ICD-10-CM | POA: Diagnosis not present

## 2022-11-30 DIAGNOSIS — H524 Presbyopia: Secondary | ICD-10-CM | POA: Diagnosis not present

## 2022-11-30 DIAGNOSIS — H401111 Primary open-angle glaucoma, right eye, mild stage: Secondary | ICD-10-CM | POA: Diagnosis not present

## 2022-12-27 DIAGNOSIS — H401122 Primary open-angle glaucoma, left eye, moderate stage: Secondary | ICD-10-CM | POA: Diagnosis not present

## 2023-01-24 DIAGNOSIS — H6123 Impacted cerumen, bilateral: Secondary | ICD-10-CM | POA: Diagnosis not present

## 2023-02-10 DIAGNOSIS — H401111 Primary open-angle glaucoma, right eye, mild stage: Secondary | ICD-10-CM | POA: Diagnosis not present

## 2023-02-24 DIAGNOSIS — H401111 Primary open-angle glaucoma, right eye, mild stage: Secondary | ICD-10-CM | POA: Diagnosis not present

## 2023-02-24 DIAGNOSIS — H401122 Primary open-angle glaucoma, left eye, moderate stage: Secondary | ICD-10-CM | POA: Diagnosis not present

## 2023-04-10 ENCOUNTER — Encounter: Payer: Self-pay | Admitting: Emergency Medicine

## 2023-04-10 ENCOUNTER — Ambulatory Visit
Admission: EM | Admit: 2023-04-10 | Discharge: 2023-04-10 | Disposition: A | Discharge status: Home / Self Care | Attending: Physician Assistant | Admitting: Physician Assistant

## 2023-04-10 DIAGNOSIS — R053 Chronic cough: Secondary | ICD-10-CM | POA: Diagnosis not present

## 2023-04-10 DIAGNOSIS — R509 Fever, unspecified: Secondary | ICD-10-CM

## 2023-04-10 LAB — POC COVID19/FLU A&B COMBO
Covid Antigen, POC: NEGATIVE
Influenza A Antigen, POC: NEGATIVE
Influenza B Antigen, POC: NEGATIVE

## 2023-04-10 MED ORDER — BENZONATATE 100 MG PO CAPS: 100.0000 mg | ORAL_CAPSULE | Freq: Three times a day (TID) | ORAL | 0 refills | Status: DC

## 2023-04-10 MED ORDER — AMOXICILLIN-POT CLAVULANATE 875-125 MG PO TABS: 1.0000 | ORAL_TABLET | Freq: Two times a day (BID) | ORAL | 0 refills | Status: DC

## 2023-04-10 NOTE — ED Provider Notes (Signed)
 [name]    CSN: [id number] Arrival date & time: 04/10/23  1009      History   Chief Complaint Chief Complaint  Patient presents with   Cough    HPI [name] [name] is a 83 y.o. male.   Patient here today for evaluation of cough that started about 4-5 days ago that seems to be worse at night. Wife reports that cough seems to be worsening and is worse at night. He has had fever at night as well with Tmax 102 F. He has not had any vomiting or diarrhea.   The history is provided by the patient and the spouse.  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   no evidence of prostate cancer per pt 11-13-14   Allergy    BRADYCARDIA 09/20/2007   CAD (coronary artery disease)    CEREBROVASCULAR ACCIDENT, HX OF 09/20/2007   COLONIC POLYPS, HX OF 12/18/2006   DIVERTICULOSIS, COLON 09/20/2007   HYPERLIPIDEMIA 12/18/2006   PERIPHERAL VASCULAR DISEASE 09/20/2007   TIA 12/03/2007   Varicose veins     Patient Active Problem List   Diagnosis Date Noted   Vitamin D deficiency 01/24/2021   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   CAD, multiple vessel 05/28/2018   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/16/2017   Fatigue 08/26/2016   Erectile dysfunction 12/26/2015   Allergy    Varicose veins of right lower extremity with complications 11/19/2013   Atherosclerotic heart disease native coronary artery w/angina pectoris (HCC) 02/28/2012   SOB (shortness of breath) 02/03/2012   Chest pain 02/03/2012   Blood pressure elevated without history of HTN 02/03/2012   Encounter for well adult exam with abnormal findings 12/18/2010   Transient cerebral ischemia 12/03/2007   PARESTHESIA 12/03/2007   ADENOCARCINOMA, 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   BRADYCARDIA 09/20/2007   PERIPHERAL VASCULAR DISEASE 09/20/2007   Diverticulosis of colon 09/20/2007    History of cardiovascular disorder 09/20/2007   Hyperlipidemia 12/18/2006   History of colonic polyps 12/18/2006    Past Surgical History:  Procedure Laterality Date   COLONOSCOPY  06/05/2009   last colon 06-05-2009 w/ patterson-polyps   COLONOSCOPY WITH PROPOFOL  11/03/2020   CORONARY ARTERY BYPASS GRAFT  02/10/2012   Procedure: CORONARY ARTERY BYPASS GRAFTING (CABG);  Surgeon: Kerin Perna, MD;  Location: [facility] OR;  Service: Open Heart Surgery;  Laterality: N/A;  CABG x three, using Bilateral mammary artery harvest and left arm radial harvest   INTRAOPERATIVE TRANSESOPHAGEAL ECHOCARDIOGRAM  02/10/2012   Procedure: INTRAOPERATIVE TRANSESOPHAGEAL ECHOCARDIOGRAM;  Surgeon: Kerin Perna, MD;  Location: [facility] OR;  Service: Open Heart Surgery;  Laterality: N/A;   LEFT HEART CATHETERIZATION WITH CORONARY ANGIOGRAM N/A 02/10/2012   Procedure: LEFT HEART CATHETERIZATION WITH CORONARY ANGIOGRAM;  Surgeon: Kathleene Hazel, MD;  Location: [facility] CATH LAB;  Service: Cardiovascular;  Laterality: N/A;   POLYPECTOMY     hx TA colon polyps   RADIAL ARTERY HARVEST  02/10/2012   Procedure: RADIAL ARTERY HARVEST;  Surgeon: Kerin Perna, MD;  Location: [facility] OR;  Service: Open Heart Surgery;  Laterality: Left;       Home Medications    Prior to Admission medications   Medication Sig Start Date End Date Taking? Authorizing Provider  amoxicillin-clavulanate (AUGMENTIN) 875-125 MG tablet Take 1 tablet by mouth every 12 (twelve) hours. 04/10/23  Yes  Tomi Bamberger, PA-C  benzonatate (TESSALON) 100 MG capsule Take 1 capsule (100 mg total) by mouth every 8 (eight) hours. 04/10/23  Yes Tomi Bamberger, PA-C  BEE POLLEN PO Take by mouth daily.    [provider]  Multiple Vitamin (MULTIVITAMIN) tablet Take 1 tablet by mouth daily. Geritol- takes 5 days a week    [provider]  Red Yeast Rice Extract (RED YEAST RICE PO) Take by mouth.    [provider]  rosuvastatin  (CRESTOR) 20 MG tablet Take 1 tablet (20 mg total) by mouth daily. 03/28/22   Corwin Levins, MD  Saw Palmetto, Serenoa repens, (SAW PALMETTO PO) Take 1 tablet by mouth daily.    [provider]  triamcinolone cream (KENALOG) 0.1 % Use as directed 05/07/18   [provider]  vitamin C (ASCORBIC ACID) 500 MG tablet Take 500 mg by mouth daily. 5 days a week    [provider]    Family History Family History  Problem Relation Age of Onset   Cancer Brother    Colon cancer Brother 16       died age 47-found out 6 months before passing he had colon cancer   Heart disease Father     Social History Social History   Tobacco Use   Smoking status: Never   Smokeless tobacco: Never  Vaping Use   Vaping status: Never Used  Substance Use Topics   Alcohol use: No    Alcohol/week: 0.0 standard drinks of alcohol   Drug use: No     Allergies   Niacin and Simvastatin   Review of Systems Review of Systems  Constitutional:  Positive for chills and fever.  HENT:  Positive for congestion. Negative for ear pain and sore throat.   Eyes:  Negative for discharge and redness.  Respiratory:  Positive for cough. Negative for shortness of breath.   Gastrointestinal:  Negative for abdominal pain, diarrhea, nausea and vomiting.     Physical Exam Triage Vital Signs ED Triage Vitals  Encounter Vitals Group     BP 04/10/23 1013 (!) 167/79     Systolic BP Percentile --      Diastolic BP Percentile --      Pulse Rate 04/10/23 1013 71     Resp 04/10/23 1013 17     Temp 04/10/23 1013 97.6 F (36.4 C)     Temp Source 04/10/23 1013 Oral     SpO2 04/10/23 1013 96 %     Weight --      Height --      Head Circumference --      Peak Flow --      Pain Score 04/10/23 1016 0     Pain Loc --      Pain Education --      Exclude from Growth Chart --    No data found.  Updated Vital Signs BP (!) 167/79 (BP Location: Right Arm)   Pulse 71   Temp 97.6 F (36.4 C) (Oral)    Resp 17   SpO2 96%   Visual Acuity Right Eye Distance:   Left Eye Distance:   Bilateral Distance:    Right Eye Near:   Left Eye Near:    Bilateral Near:     Physical Exam Vitals and nursing note reviewed.  Constitutional:      General: He is not in acute distress.    Appearance: Normal appearance. He is not ill-appearing.  HENT:  Head: Normocephalic and atraumatic.     Nose: Congestion present.     Mouth/Throat:     Mouth: Mucous membranes are moist.     Pharynx: Oropharynx is clear. No oropharyngeal exudate or posterior oropharyngeal erythema.  Eyes:     Conjunctiva/sclera: Conjunctivae normal.  Cardiovascular:     Rate and Rhythm: Normal rate and regular rhythm.     Heart sounds: Normal heart sounds. No murmur heard. Pulmonary:     Effort: Pulmonary effort is normal. No respiratory distress.     Breath sounds: Normal breath sounds. No wheezing, rhonchi or rales.  Skin:    General: Skin is warm and dry.  Neurological:     Mental Status: He is alert.  Psychiatric:        Mood and Affect: Mood normal.        Thought Content: Thought content normal.      UC Treatments / Results  Labs (all labs ordered are listed, but only abnormal results are displayed) Labs Reviewed  POC COVID19/FLU A&B COMBO - Normal    EKG   Radiology No results found.  Procedures Procedures (including critical care time)  Medications Ordered in UC Medications - No data to display  Initial Impression / Assessment and Plan / UC Course  I have reviewed the triage vital signs and the nursing notes.  Pertinent labs & imaging results that were available during my care of the patient were reviewed by me and considered in my medical decision making (see chart for details).    Given persistent cough with fever will treat with Augmentin as well as Tessalon Perles for cough.  Advised further evaluation if no gradual improvement or with any further concerns.  Rapid COVID and flu testing  negative in office.  Final Clinical Impressions(s) / UC Diagnoses   Final diagnoses:  Persistent cough  Fever, unspecified fever cause   Discharge Instructions   None    ED Prescriptions     Medication Sig Dispense Auth. Provider   amoxicillin-clavulanate (AUGMENTIN) 875-125 MG tablet Take 1 tablet by mouth every 12 (twelve) hours. 14 tablet Erma Pinto F, PA-C   benzonatate (TESSALON) 100 MG capsule Take 1 capsule (100 mg total) by mouth every 8 (eight) hours. 21 capsule Tomi Bamberger, PA-C      PDMP not reviewed this encounter.   Tomi Bamberger, PA-C 04/10/23 [phone]

## 2023-04-10 NOTE — ED Triage Notes (Signed)
 Pt c/o cough since last Thursday and is worse at night.

## 2023-05-09 ENCOUNTER — Ambulatory Visit (INDEPENDENT_AMBULATORY_CARE_PROVIDER_SITE_OTHER)

## 2023-05-09 VITALS — BP 168/80 | HR 68 | Ht 65.0 in | Wt 178.0 lb

## 2023-05-09 DIAGNOSIS — Z0000 Encounter for general adult medical examination without abnormal findings: Secondary | ICD-10-CM

## 2023-05-09 NOTE — Progress Notes (Signed)
 Subjective:   [name] [name] is a 83 y.o. who presents for a Medicare Wellness preventive visit.  Visit Complete: In person  Persons Participating in Visit: Patient.  AWV Questionnaire: No: Patient Medicare AWV questionnaire was not completed prior to this visit.  Cardiac Risk Factors include: advanced age (>21men, >60 women);dyslipidemia;male gender     Objective:    Today's Vitals   05/09/23 0853 05/09/23 0859  BP: (!) 168/80 (!) 168/80  Pulse: 68   SpO2: 98%   Weight: 178 lb (80.7 kg)   Height: 5\' 5"  (1.651 m)    Body mass index is 29.62 kg/m.     05/09/2023    8:35 AM 04/06/2022   11:06 AM 05/09/2017    8:49 AM 11/17/2016   11:36 AM 10/17/2016    2:33 PM 11/21/2014   12:39 PM 11/13/2014   10:11 AM  Advanced Directives  Does Patient Have a Medical Advance Directive? No Yes Yes Yes Yes Yes Yes  Type of Advance Directive  Living will Healthcare Power of [place];Out of facility DNR (pink MOST or yellow form) Living will;Healthcare Power of [employer] of [place];Living will  Healthcare Power of [place];Living will  Copy of Healthcare Power of Attorney in Chart?    No - copy requested  No - copy requested   Would patient like information on creating a medical advance directive? Yes (MAU/Ambulatory/Procedural Areas - Information given)          Current Medications (verified) Outpatient Encounter Medications as of 05/09/2023  Medication Sig   BEE POLLEN PO Take by mouth daily.   Multiple Vitamin (MULTIVITAMIN) tablet Take 1 tablet by mouth daily. Geritol- takes 5 days a week   Red Yeast Rice Extract (RED YEAST RICE PO) Take by mouth.   rosuvastatin  (CRESTOR ) 20 MG tablet Take 1 tablet (20 mg total) by mouth daily.   Saw Palmetto, Serenoa repens, (SAW PALMETTO PO) Take 1 tablet by mouth daily.   triamcinolone cream (KENALOG) 0.1 % Use as directed   vitamin C (ASCORBIC ACID) 500 MG tablet Take 500 mg by mouth daily. 5 days a week   [DISCONTINUED]  amoxicillin -clavulanate (AUGMENTIN ) 875-125 MG tablet Take 1 tablet by mouth every 12 (twelve) hours.   [DISCONTINUED] benzonatate  (TESSALON ) 100 MG capsule Take 1 capsule (100 mg total) by mouth every 8 (eight) hours.   No facility-administered encounter medications on file as of 05/09/2023.    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   no evidence of prostate cancer per pt 11-13-14   Allergy    BRADYCARDIA 09/20/2007   CAD (coronary artery disease)    CEREBROVASCULAR ACCIDENT, HX OF 09/20/2007   COLONIC POLYPS, HX OF 12/18/2006   DIVERTICULOSIS, COLON 09/20/2007   HYPERLIPIDEMIA 12/18/2006   PERIPHERAL VASCULAR DISEASE 09/20/2007   TIA 12/03/2007   Varicose veins    Past Surgical History:  Procedure Laterality Date   COLONOSCOPY  06/05/2009   last colon 06-05-2009 w/ patterson-polyps   COLONOSCOPY WITH PROPOFOL   11/03/2020   CORONARY ARTERY BYPASS GRAFT  02/10/2012   Procedure: CORONARY ARTERY BYPASS GRAFTING (CABG);  Surgeon: Heriberto London, MD;  Location: [facility] OR;  Service: Open Heart Surgery;  Laterality: N/A;  CABG x three, using Bilateral mammary artery harvest and left arm radial harvest   INTRAOPERATIVE TRANSESOPHAGEAL ECHOCARDIOGRAM  02/10/2012   Procedure: INTRAOPERATIVE TRANSESOPHAGEAL ECHOCARDIOGRAM;  Surgeon: Heriberto London, MD;  Location: [facility] OR;  Service: Open Heart  Surgery;  Laterality: N/A;   LEFT HEART CATHETERIZATION WITH CORONARY ANGIOGRAM N/A 02/10/2012   Procedure: LEFT HEART CATHETERIZATION WITH CORONARY ANGIOGRAM;  Surgeon: Odie Benne, MD;  Location: [facility] CATH LAB;  Service: Cardiovascular;  Laterality: N/A;   POLYPECTOMY     hx TA colon polyps   RADIAL ARTERY HARVEST  02/10/2012   Procedure: RADIAL ARTERY HARVEST;  Surgeon: Heriberto London, MD;  Location: [facility] OR;  Service: Open Heart Surgery;  Laterality: Left;   Family History  Problem Relation Age of Onset    Cancer Brother    Colon cancer Brother 34       died age 48-found out 6 months before passing he had colon cancer   Heart disease Father    Social History   Socioeconomic History   Marital status: Married    Spouse name: Not on file   Number of children: Not on file   Years of education: Not on file   Highest education level: Not on file  Occupational History   Not on file  Tobacco Use   Smoking status: Never    Passive exposure: Never   Smokeless tobacco: Never  Vaping Use   Vaping status: Never Used  Substance and Sexual Activity   Alcohol use: No    Alcohol/week: 0.0 standard drinks of alcohol   Drug use: No   Sexual activity: Not Currently  Other Topics Concern   Not on file  Social History Narrative   Married   Social Drivers of Health   Financial Resource Strain: Low Risk  (05/09/2023)   Overall Financial Resource Strain (CARDIA)    Difficulty of Paying Living Expenses: Not hard at all  Food Insecurity: No Food Insecurity (05/09/2023)   Hunger Vital Sign    Worried About Running Out of Food in the Last Year: Never true    Ran Out of Food in the Last Year: Never true  Transportation Needs: No Transportation Needs (05/09/2023)   PRAPARE - Administrator, Civil Service (Medical): No    Lack of Transportation (Non-Medical): No  Physical Activity: Sufficiently Active (05/09/2023)   Exercise Vital Sign    Days of Exercise per Week: 5 days    Minutes of Exercise per Session: 30 min  Stress: No Stress Concern Present (05/09/2023)   [employer] of Occupational Health - Occupational Stress Questionnaire    Feeling of Stress : Not at all  Social Connections: Moderately Isolated (05/09/2023)   Social Connection and Isolation Panel [NHANES]    Frequency of Communication with Friends and Family: More than three times a week    Frequency of Social Gatherings with Friends and Family: More than three times a week    Attends Religious Services: Never    Automotive engineer or Organizations: Not on file    Attends Banker Meetings: Never    Marital Status: Married    Tobacco Counseling Counseling given: No    Clinical Intake:  Pre-visit preparation completed: Yes  Pain : No/denies pain     BMI - recorded: 29.62 Nutritional Risks: None Diabetes: No  Lab Results  Component Value Date   HGBA1C 5.7 02/25/2022   HGBA1C 5.7 01/27/2021     How often do you need to have someone help you when you read instructions, pamphlets, or other written materials from your doctor or pharmacy?: 1 - Never  Interpreter Needed?: No  Information entered by :: Kandy Orris, CMA   Activities of Daily Living  05/09/2023    8:38 AM  In your present state of health, do you have any difficulty performing the following activities:  Hearing? 0  Vision? 0  Difficulty concentrating or making decisions? 0  Walking or climbing stairs? 0  Dressing or bathing? 0  Doing errands, shopping? 0  Preparing Food and eating ? N  Using the Toilet? N  In the past six months, have you accidently leaked urine? N  Do you have problems with loss of bowel control? N  Managing your Medications? N  Managing your Finances? N  Housekeeping or managing your Housekeeping? N    Patient Care Team: Roslyn Coombe, MD as PCP - General Abel Hoe Coy Ditty, MD as PCP - Cardiology (Cardiology) Kristopher Pheasant, MD as Attending Physician (Cardiology)  Indicate any recent Medical Services you may have received from other than [facility] providers in the past year (date may be approximate).     Assessment:   This is a routine wellness examination for [place].  Hearing/Vision screen Hearing Screening - Comments:: Denies hearing difficulties   Vision Screening - Comments:: Wears rx glasses - up to date with routine eye exams    Goals Addressed               This Visit's Progress     Patient Stated (pt-stated)        Patient stated plans to stay  active.       Depression Screen     05/09/2023    8:40 AM 04/06/2022   11:05 AM 02/22/2022    2:10 PM 01/22/2021    8:23 AM 01/22/2021    8:08 AM 11/15/2018    8:14 AM 03/21/2017   10:16 AM  PHQ 2/9 Scores  PHQ - 2 Score 0 0 0 0 0 0 0  PHQ- 9 Score 0 0 0        Fall Risk     05/09/2023    8:42 AM 04/06/2022   11:07 AM 02/22/2022    2:10 PM 01/22/2021    8:23 AM 01/22/2021    8:08 AM  Fall Risk   Falls in the past year? 0 0 0 0 0  Number falls in past yr: 0 0 0 0 0  Injury with Fall? 0 0 0 0 0  Risk for fall due to : No Fall Risks No Fall Risks No Fall Risks    Follow up Falls prevention discussed;Falls evaluation completed Falls prevention discussed Falls evaluation completed      MEDICARE RISK AT HOME:  Medicare Risk at Home Any stairs in or around the home?: Yes If so, are there any without handrails?: No Home free of loose throw rugs in walkways, pet beds, electrical cords, etc?: Yes Adequate lighting in your home to reduce risk of falls?: Yes Life alert?: No Use of a cane, [place] or w/c?: No Grab bars in the bathroom?: Yes Shower chair or bench in shower?: No Elevated toilet seat or a handicapped toilet?: No  TIMED UP AND GO:  Was the test performed?  No  Cognitive Function: 6CIT completed        05/09/2023    8:45 AM 04/06/2022   11:07 AM  6CIT Screen  What Year? 0 points 0 points  What month? 0 points 0 points  What time? 0 points 0 points  Count back from 20 0 points 0 points  Months in reverse 0 points 0 points  Repeat phrase 2 points 0 points  Total Score 2  points 0 points    Immunizations Immunization History  Administered Date(s) Administered   Fluad Quad(high Dose 65+) 02/22/2022   Influenza Split 12/23/2010   Influenza Whole 11/16/2007, 10/23/2009   Influenza, High Dose Seasonal PF 09/23/2014, 12/25/2015, 11/02/2017, 10/31/2018   Influenza-Unspecified 10/17/2013, 10/17/2016, 12/28/2020   PFIZER Comirnaty(Gray Top)Covid-19 Tri-Sucrose Vaccine  11/25/2019, 01/27/2020   Pneumococcal Conjugate-13 03/21/2017   Pneumococcal Polysaccharide-23 09/23/2014   Td 01/18/1996, 01/18/2004, 09/23/2014    Screening Tests Health Maintenance  Topic Date Due   Zoster Vaccines- Shingrix (1 of 2) Never done   COVID-19 Vaccine (3 - Pfizer risk series) 02/24/2020   INFLUENZA VACCINE  08/18/2023   Medicare Annual Wellness (AWV)  05/08/2024   DTaP/Tdap/Td (4 - Tdap) 09/22/2024   Pneumonia Vaccine 4+ Years old  Completed   HPV VACCINES  Aged Out   Meningococcal B Vaccine  Aged Out   Colonoscopy  Discontinued    Health Maintenance  Health Maintenance Due  Topic Date Due   Zoster Vaccines- Shingrix (1 of 2) Never done   COVID-19 Vaccine (3 - Pfizer risk series) 02/24/2020   Health Maintenance Items Addressed: 05/09/2023   Additional Screening:  Vision Screening: Recommended annual ophthalmology exams for early detection of glaucoma and other disorders of the eye.  Pt stated does have a new Ophthalmologist (unable to recall name).  Dental Screening: Recommended annual dental exams for proper oral hygiene  Community Resource Referral / Chronic Care Management: CRR required this visit?  No   CCM required this visit?  No     Plan:     I have personally reviewed and noted the following in the patient's chart:   Medical and social history Use of alcohol, tobacco or illicit drugs  Current medications and supplements including opioid prescriptions. Patient is not currently taking opioid prescriptions. Functional ability and status Nutritional status Physical activity Advanced directives List of other physicians Hospitalizations, surgeries, and ER visits in previous 12 months Vitals Screenings to include cognitive, depression, and falls Referrals and appointments  In addition, I have reviewed and discussed with patient certain preventive protocols, quality metrics, and best practice recommendations. A written personalized care plan  for preventive services as well as general preventive health recommendations were provided to patient.     Patria Bookbinder, CMA   05/09/2023   After Visit Summary: (In Person-Declined) Patient declined AVS at this time.  Notes:  Scheduled an appt w/PCP for this Monday (Physical)

## 2023-05-09 NOTE — Patient Instructions (Signed)
 Mr. [name] [name] , Thank you for taking time to come for your Medicare Wellness Visit. I appreciate your ongoing commitment to your health goals. Please review the following plan we discussed and let me know if I can assist you in the future.   Referrals/Orders/Follow-Ups/Clinician Recommendations: Aim for 30 minutes of exercise or brisk walking, 6-8 glasses of water, and 5 servings of fruits and vegetables each day. Educated and advised on the Shingles vaccine.  This is a list of the screening recommended for you and due dates:  Health Maintenance  Topic Date Due   Zoster (Shingles) Vaccine (1 of 2) Never done   COVID-19 Vaccine (3 - Pfizer risk series) 02/24/2020   Flu Shot  08/18/2023   Medicare Annual Wellness Visit  05/08/2024   DTaP/Tdap/Td vaccine (4 - Tdap) 09/22/2024   Pneumonia Vaccine  Completed   HPV Vaccine  Aged Out   Meningitis B Vaccine  Aged Out   Colon Cancer Screening  Discontinued    Advanced directives: (Provided) Advance directive discussed with you today. I have provided a copy for you to complete at home and have notarized. Once this is complete, please bring a copy in to our office so we can scan it into your chart.   Next Medicare Annual Wellness Visit scheduled for next year: Yes

## 2023-05-10 DIAGNOSIS — D0472 Carcinoma in situ of skin of left lower limb, including hip: Secondary | ICD-10-CM | POA: Diagnosis not present

## 2023-05-10 DIAGNOSIS — Z129 Encounter for screening for malignant neoplasm, site unspecified: Secondary | ICD-10-CM | POA: Diagnosis not present

## 2023-05-10 DIAGNOSIS — D485 Neoplasm of uncertain behavior of skin: Secondary | ICD-10-CM | POA: Diagnosis not present

## 2023-05-10 DIAGNOSIS — L820 Inflamed seborrheic keratosis: Secondary | ICD-10-CM | POA: Diagnosis not present

## 2023-05-10 DIAGNOSIS — L821 Other seborrheic keratosis: Secondary | ICD-10-CM | POA: Diagnosis not present

## 2023-05-15 ENCOUNTER — Encounter: Admitting: Internal Medicine

## 2023-05-19 ENCOUNTER — Encounter: Payer: Self-pay | Admitting: Internal Medicine

## 2023-05-19 ENCOUNTER — Ambulatory Visit (INDEPENDENT_AMBULATORY_CARE_PROVIDER_SITE_OTHER): Admitting: Internal Medicine

## 2023-05-19 VITALS — BP 162/88 | HR 64 | Temp 97.9°F | Ht 65.0 in | Wt 183.0 lb

## 2023-05-19 DIAGNOSIS — N529 Male erectile dysfunction, unspecified: Secondary | ICD-10-CM | POA: Diagnosis not present

## 2023-05-19 DIAGNOSIS — R739 Hyperglycemia, unspecified: Secondary | ICD-10-CM

## 2023-05-19 DIAGNOSIS — E538 Deficiency of other specified B group vitamins: Secondary | ICD-10-CM | POA: Diagnosis not present

## 2023-05-19 DIAGNOSIS — Z0000 Encounter for general adult medical examination without abnormal findings: Secondary | ICD-10-CM

## 2023-05-19 DIAGNOSIS — E559 Vitamin D deficiency, unspecified: Secondary | ICD-10-CM

## 2023-05-19 DIAGNOSIS — I25119 Atherosclerotic heart disease of native coronary artery with unspecified angina pectoris: Secondary | ICD-10-CM | POA: Diagnosis not present

## 2023-05-19 DIAGNOSIS — Z0001 Encounter for general adult medical examination with abnormal findings: Secondary | ICD-10-CM

## 2023-05-19 DIAGNOSIS — E7800 Pure hypercholesterolemia, unspecified: Secondary | ICD-10-CM | POA: Diagnosis not present

## 2023-05-19 LAB — URINALYSIS, ROUTINE W REFLEX MICROSCOPIC
Bilirubin Urine: NEGATIVE
Hgb urine dipstick: NEGATIVE
Ketones, ur: NEGATIVE
Leukocytes,Ua: NEGATIVE
Nitrite: NEGATIVE
RBC / HPF: NONE SEEN (ref 0–?)
Specific Gravity, Urine: <=1.005 — AB (ref 1.000–1.030)
Total Protein, Urine: NEGATIVE
Urine Glucose: NEGATIVE
Urobilinogen, UA: 0.2 (ref 0.0–1.0)
pH: 6.5 (ref 5.0–8.0)

## 2023-05-19 LAB — BASIC METABOLIC PANEL WITH GFR
BUN: 12 mg/dL (ref 6–23)
CO2: 29 meq/L (ref 19–32)
Calcium: 9.1 mg/dL (ref 8.4–10.5)
Chloride: 101 meq/L (ref 96–112)
Creatinine, Ser: 1.09 mg/dL (ref 0.40–1.50)
GFR: 63.17 mL/min (ref >60.00)
Glucose, Bld: 100 mg/dL — ABNORMAL HIGH (ref 70–99)
Potassium: 4.6 meq/L (ref 3.5–5.1)
Sodium: 135 meq/L (ref 135–145)

## 2023-05-19 LAB — LIPID PANEL
Cholesterol: 199 mg/dL (ref 0–200)
HDL: 54.4 mg/dL (ref >39.00)
LDL Cholesterol: 131 mg/dL — ABNORMAL HIGH (ref 0–99)
NonHDL: 144.98
Total CHOL/HDL Ratio: 4
Triglycerides: 72 mg/dL (ref 0.0–149.0)
VLDL: 14.4 mg/dL (ref 0.0–40.0)

## 2023-05-19 LAB — HEPATIC FUNCTION PANEL
ALT: 12 U/L (ref 0–53)
AST: 20 U/L (ref 0–37)
Albumin: 4.1 g/dL (ref 3.5–5.2)
Alkaline Phosphatase: 56 U/L (ref 39–117)
Bilirubin, Direct: 0.1 mg/dL (ref 0.0–0.3)
Total Bilirubin: 0.6 mg/dL (ref 0.2–1.2)
Total Protein: 7.1 g/dL (ref 6.0–8.3)

## 2023-05-19 LAB — TSH: TSH: 2.56 u[IU]/mL (ref 0.35–5.50)

## 2023-05-19 LAB — HEMOGLOBIN A1C: Hgb A1c MFr Bld: 5.7 % (ref 4.6–6.5)

## 2023-05-19 LAB — VITAMIN B12: Vitamin B-12: 440 pg/mL (ref 211–911)

## 2023-05-19 LAB — VITAMIN D 25 HYDROXY (VIT D DEFICIENCY, FRACTURES): VITD: 39 ng/mL (ref 30.00–100.00)

## 2023-05-19 MED ORDER — SILDENAFIL CITRATE 100 MG PO TABS: 50.0000 mg | ORAL_TABLET | Freq: Every day | ORAL | 11 refills | Status: AC | PRN

## 2023-05-19 MED ORDER — EZETIMIBE 10 MG PO TABS: 10.0000 mg | ORAL_TABLET | Freq: Every day | ORAL | 3 refills | Status: AC

## 2023-05-19 NOTE — Patient Instructions (Addendum)
 Please have your Shingrix (shingles) shots done at your local pharmacy.\  Please take all new medication as prescribed  - the viagra as needed  Please continue all other medications as before, and refills have been done if requested.  Please have the pharmacy call with any other refills you may need.  Please continue your efforts at being more active, low cholesterol diet, and weight control.  You are otherwise up to date with prevention measures today.  Please keep your appointments with your specialists as you may have planned  You will be contacted regarding the referral for: Cardiology  Please go to the LAB at the blood drawing area for the tests to be done  You will be contacted by phone if any changes need to be made immediately.  Otherwise, you will receive a letter about your results with an explanation, but please check with MyChart first.  Please make an Appointment to return in 6 months, or sooner if needed

## 2023-05-19 NOTE — Progress Notes (Unsigned)
 Patient ID: [name] [name], male   DOB: 1940/07/07, 83 y.o.   MRN: [id number]         Chief Complaint:: wellness exam and htn, ED hld, low vit d, CAD       HPI:  [name] [name] is a 83 y.o. male here for wellness exam; for shingrix at pharmacy, o/w up to date                        Also Pt denies chest pain, increased sob or doe, wheezing, orthopnea, PND, increased LE swelling, palpitations, dizziness or syncope.  Has not been scheduled for cardiology f/u.  Bps have been controlled at home, gets nervous coming here.   Has worsening ED in last 6 mo, hoping for new start viagra prn.   Pt denies polydipsia, polyuria, or new focal neuro s/s.    Pt denies fever, wt loss, night sweats, loss of appetite, or other constitutional symptoms     Wt Readings from Last 3 Encounters:  05/19/23 183 lb (83 kg)  05/09/23 178 lb (80.7 kg)  06/01/22 184 lb 8 oz (83.7 kg)   BP Readings from Last 3 Encounters:  05/19/23 (!) 162/88  05/09/23 (!) 168/80  04/10/23 (!) 167/79   Immunization History  Administered Date(s) Administered   Fluad Quad(high Dose 65+) 02/22/2022   Influenza Split 12/23/2010   Influenza Whole 11/16/2007, 10/23/2009   Influenza, High Dose Seasonal PF 09/23/2014, 12/25/2015, 11/02/2017, 10/31/2018   Influenza-Unspecified 10/17/2013, 10/17/2016, 12/28/2020   PFIZER Comirnaty(Gray Top)Covid-19 Tri-Sucrose Vaccine 11/25/2019, 01/27/2020   Pneumococcal Conjugate-13 03/21/2017   Pneumococcal Polysaccharide-23 09/23/2014   Td 01/18/1996, 01/18/2004, 09/23/2014   Health Maintenance Due  Topic Date Due   Zoster Vaccines- Shingrix (1 of 2) Never done      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   no evidence of prostate cancer per pt 11-13-14   Allergy    BRADYCARDIA 09/20/2007   CAD (coronary artery disease)    CEREBROVASCULAR ACCIDENT, HX OF 09/20/2007   COLONIC POLYPS, HX OF 12/18/2006   DIVERTICULOSIS, COLON 09/20/2007   HYPERLIPIDEMIA 12/18/2006    PERIPHERAL VASCULAR DISEASE 09/20/2007   TIA 12/03/2007   Varicose veins    Past Surgical History:  Procedure Laterality Date   COLONOSCOPY  06/05/2009   last colon 06-05-2009 w/ patterson-polyps   COLONOSCOPY WITH PROPOFOL   11/03/2020   CORONARY ARTERY BYPASS GRAFT  02/10/2012   Procedure: CORONARY ARTERY BYPASS GRAFTING (CABG);  Surgeon: Heriberto London, MD;  Location: [facility] OR;  Service: Open Heart Surgery;  Laterality: N/A;  CABG x three, using Bilateral mammary artery harvest and left arm radial harvest   INTRAOPERATIVE TRANSESOPHAGEAL ECHOCARDIOGRAM  02/10/2012   Procedure: INTRAOPERATIVE TRANSESOPHAGEAL ECHOCARDIOGRAM;  Surgeon: Heriberto London, MD;  Location: [facility] OR;  Service: Open Heart Surgery;  Laterality: N/A;   LEFT HEART CATHETERIZATION WITH CORONARY ANGIOGRAM N/A 02/10/2012   Procedure: LEFT HEART CATHETERIZATION WITH CORONARY ANGIOGRAM;  Surgeon: Odie Benne, MD;  Location: [facility] CATH LAB;  Service: Cardiovascular;  Laterality: N/A;   POLYPECTOMY     hx TA colon polyps   RADIAL ARTERY HARVEST  02/10/2012   Procedure: RADIAL ARTERY HARVEST;  Surgeon: Heriberto London, MD;  Location: [facility] OR;  Service: Open Heart Surgery;  Laterality: Left;    reports that he has never smoked. He has never been exposed to tobacco smoke. He has never used smokeless tobacco. He reports that he does not drink alcohol and  does not use drugs. family history includes Cancer in his brother; Colon cancer (age of onset: 32) in his brother; Heart disease in his father. Allergies  Allergen Reactions   Niacin     REACTION: hives   Simvastatin Other (See Comments)    myalgias   Current Outpatient Medications on File Prior to Visit  Medication Sig Dispense Refill   BEE POLLEN PO Take by mouth daily.     dorzolamide-timolol (COSOPT) 2-0.5 % ophthalmic solution 1 drop 2 (two) times daily.     Multiple Vitamin (MULTIVITAMIN) tablet Take 1 tablet by mouth daily. Geritol- takes 5 days a week     Red Yeast  Rice Extract (RED YEAST RICE PO) Take by mouth.     Saw Palmetto, Serenoa repens, (SAW PALMETTO PO) Take 1 tablet by mouth daily.     triamcinolone cream (KENALOG) 0.1 % Use as directed     vitamin C (ASCORBIC ACID) 500 MG tablet Take 500 mg by mouth daily. 5 days a week     No current facility-administered medications on file prior to visit.        ROS:  All others reviewed and negative.  Objective        PE:  BP (!) 162/88 (BP Location: Right Arm, Patient Position: Sitting, Cuff Size: Normal)   Pulse 64   Temp 97.9 F (36.6 C) (Oral)   Ht 5\' 5"  (1.651 m)   Wt 183 lb (83 kg)   SpO2 98%   BMI 30.45 kg/m                 Constitutional: Pt appears in NAD               HENT: Head: NCAT.                Right Ear: External ear normal.                 Left Ear: External ear normal.                Eyes: . Pupils are equal, round, and reactive to light. Conjunctivae and EOM are normal               Nose: without d/c or deformity               Neck: Neck supple. Gross normal ROM               Cardiovascular: Normal rate and regular rhythm.                 Pulmonary/Chest: Effort normal and breath sounds without rales or wheezing.                Abd:  Soft, NT, ND, + BS, no organomegaly               Neurological: Pt is alert. At baseline orientation, motor grossly intact               Skin: Skin is warm. No rashes, no other new lesions, LE edema - none               Psychiatric: Pt behavior is normal without agitation   Micro: none  Cardiac tracings I have personally interpreted today:  none  Pertinent Radiological findings (summarize): none   Lab Results  Component Value Date   WBC 6.0 02/25/2022   HGB 15.4 02/25/2022   HCT 45.7 02/25/2022   PLT 228.0 02/25/2022  GLUCOSE 92 02/25/2022   CHOL 239 (H) 02/25/2022   TRIG 212.0 (H) 02/25/2022   HDL 49.20 02/25/2022   LDLDIRECT 152.0 02/25/2022   LDLCALC 177 (H) 01/27/2021   ALT 14 02/25/2022   AST 20 02/25/2022   NA 137  02/25/2022   K 3.9 02/25/2022   CL 99 02/25/2022   CREATININE 1.07 02/25/2022   BUN 13 02/25/2022   CO2 30 02/25/2022   TSH 2.81 02/25/2022   PSA 12.89 (H) 11/15/2018   INR 1.40 02/11/2012   HGBA1C 5.7 02/25/2022   MICROALBUR <0.7 02/25/2022   Assessment/Plan:  Jayvaughn Knippenberg is a 83 y.o. White or Caucasian [1] male with  has a past medical history of ADENOCARCINOMA, PROSTATE (09/20/2007), Allergy, BRADYCARDIA (09/20/2007), CAD (coronary artery disease), CEREBROVASCULAR ACCIDENT, HX OF (09/20/2007), COLONIC POLYPS, HX OF (12/18/2006), DIVERTICULOSIS, COLON (09/20/2007), HYPERLIPIDEMIA (12/18/2006), PERIPHERAL VASCULAR DISEASE (09/20/2007), TIA (12/03/2007), and Varicose veins.  Encounter for well adult exam with abnormal findings Age and sex appropriate education and counseling updated with regular exercise and diet Referrals for preventative services - none needed Immunizations addressed - for shingrix at pharmacy Smoking counseling  - none needed Evidence for depression or other mood disorder - none significant Most recent labs reviewed. 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   Vitamin D  deficiency Last vitamin D  Lab Results  Component Value Date   VD25OH 27.02 (L) 02/25/2022   Low, to start oral replacement   Erectile dysfunction Mild to mod, for viagra prn, to f/u any worsening symptoms or concerns Followup: Return in about 6 months (around 11/19/2023).  Rosalia Colonel, MD  05/20/2023 8:55 AM [facility]

## 2023-05-20 ENCOUNTER — Encounter: Payer: Self-pay | Admitting: Internal Medicine

## 2023-05-20 NOTE — Assessment & Plan Note (Signed)

## 2023-05-20 NOTE — Assessment & Plan Note (Signed)
 Stable overall, cont current med tx, for card referral for follow up

## 2023-05-20 NOTE — Assessment & Plan Note (Signed)
 Last vitamin D  Lab Results  Component Value Date   VD25OH 27.02 (L) 02/25/2022   Low, to start oral replacement

## 2023-05-20 NOTE — Assessment & Plan Note (Signed)
 Lab Results  Component Value Date   LDLCALC 177 (H) 01/27/2021   Severe, , pt has been statin intolerant, will take zetia  10 mg, but declines other tx such as repatha as well.

## 2023-05-20 NOTE — Assessment & Plan Note (Signed)
Mild to mod, for viagra prn,  to f/u any worsening symptoms or concerns 

## 2023-05-21 ENCOUNTER — Encounter: Payer: Self-pay | Admitting: Internal Medicine

## 2023-05-21 LAB — CBC WITH DIFFERENTIAL/PLATELET
Basophils Absolute: 0.1 10*3/uL (ref 0.0–0.1)
Basophils Relative: 0.7 % (ref 0.0–3.0)
Eosinophils Absolute: 0.2 10*3/uL (ref 0.0–0.7)
Eosinophils Relative: 2.7 % (ref 0.0–5.0)
HCT: 44.3 % (ref 39.0–52.0)
Hemoglobin: 15 g/dL (ref 13.0–17.0)
Lymphocytes Relative: 12.4 % (ref 12.0–46.0)
Lymphs Abs: 0.9 10*3/uL (ref 0.7–4.0)
MCHC: 33.8 g/dL (ref 30.0–36.0)
MCV: 99.9 fl (ref 78.0–100.0)
Monocytes Absolute: 0.8 10*3/uL (ref 0.1–1.0)
Monocytes Relative: 10.8 % (ref 3.0–12.0)
Neutro Abs: 5.5 10*3/uL (ref 1.4–7.7)
Neutrophils Relative %: 73.4 % (ref 43.0–77.0)
Platelets: 227 10*3/uL (ref 150.0–400.0)
RBC: 4.43 Mil/uL (ref 4.22–5.81)
RDW: 13.3 % (ref 11.5–15.5)
WBC: 7.4 10*3/uL (ref 4.0–10.5)

## 2023-06-22 ENCOUNTER — Encounter: Payer: Self-pay | Admitting: Internal Medicine

## 2023-06-22 DIAGNOSIS — H401111 Primary open-angle glaucoma, right eye, mild stage: Secondary | ICD-10-CM | POA: Diagnosis not present

## 2023-06-22 DIAGNOSIS — H401122 Primary open-angle glaucoma, left eye, moderate stage: Secondary | ICD-10-CM | POA: Diagnosis not present

## 2023-07-09 NOTE — Progress Notes (Unsigned)
 Cardiology Office Note    Date:  07/11/2023  ID:  Maikel Neisler, DOB [birth year], MRN [id number] PCP:  Norleen Lynwood ORN, MD  Cardiologist:  Lonni Cash, MD  Electrophysiologist:  None   Chief Complaint: f/u CABG  History of Present Illness: .    [name] [name] is a 83 y.o. 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 He has not tolerated statins and did not wish to pursue PCSK9i. Echo April 2018 showed normal LV function, G2DD, mildly dilated aortic root, mildly increased PASP, mild AI, mild MR. ASA was previously stopped due to GI bleeding per Dr. Santa notes and he has not wished to restart. He has not tolerated beta blockers due to bradycardia. He has a history of whitecoat HTN.  He is seen back for follow-up today doing well without angina, dyspnea, palpitations, syncope or any cardiac complaints. He is able to walk regularly in his neighborhood without symptoms. Blood pressure is mildly elevated today but as in prior visits he reports BP is normal at home, ie 120/70s. He prefers a simplified approach to his care. He confirms he is taking rosuvastatin  20mg  but not Zetia  due to muscle aches. He had prior myalgias on simvastatin as well as even lower doses of rosuvastatin  in the past therefore does not want to increase dose of rosuvastatin . He states the rosuvastatin  was added to his regimen last year (03/2022). Lipid panel in 05/2023 showed LDL 131 - he thinks he was taking rosuvastatin  at the time this was obtained (previous values 170s).   Labwork independently reviewed: 05/2023 A1c 5.7, LDL 131, trig 72, LFTs, CBC ok, TSH ok  ROS: .    Please see the history of present illness.  All other systems are reviewed and otherwise negative.  Studies Reviewed: SABRA     EKG:  EKG is ordered today, personally reviewed, demonstrating:  EKG Interpretation Date/Time:  Tuesday July 11 2023 08:15:46 EDT Ventricular Rate:  51 PR Interval:  210 QRS Duration:  88 QT Interval:  426 QTC Calculation: 392 R Axis:   58  Text Interpretation: Sinus bradycardia with 1st degree A-V block Inferior ST upsloping with new TWI I, avL Confirmed by Zenobia Kuennen [phone]) on 07/11/2023 9:18:46 AM     CV Studies: Cardiac studies reviewed are outlined and summarized above. Otherwise please see EMR for full report.   Current Reported Medications:.    Current Meds  Medication Sig   BEE POLLEN PO Take by mouth daily.   dorzolamide-timolol (COSOPT) 2-0.5 % ophthalmic solution 1 drop 2 (two) times daily.   Multiple Vitamin (MULTIVITAMIN) tablet Take 1 tablet by mouth daily. Geritol- takes 5 days a week   Red Yeast Rice Extract (RED YEAST RICE PO) Take by mouth.   rosuvastatin  (CRESTOR ) 20 MG tablet Take 20 mg by mouth daily.   Saw Palmetto, Serenoa repens, (SAW PALMETTO PO) Take 1 tablet by mouth daily.   sildenafil  (VIAGRA ) 100 MG tablet Take 0.5-1 tablets (50-100 mg total) by mouth daily as needed.   triamcinolone cream (KENALOG) 0.1 % Use as directed   VEVYE 0.1 % SOLN Apply 1 drop to eye 2 (two) times daily.   vitamin C (ASCORBIC ACID) 500 MG tablet Take 500 mg by mouth daily. 5 days a week    Physical Exam:  VS:  BP 136/88   Pulse (!) 51   Ht 5' 10 (1.778 m)   Wt 179 lb (81.2 kg)   SpO2 97%   BMI 25.68 kg/m    Wt Readings from Last 3 Encounters:  07/11/23 179 lb (81.2 kg)  05/19/23 183 lb (83 kg)  05/09/23 178 lb (80.7 kg)    GEN: Well nourished, well developed in no acute distress NECK: No JVD; No carotid bruits CARDIAC: RRR, no murmurs, rubs, gallops RESPIRATORY:  Clear to auscultation without rales, wheezing or rhonchi  ABDOMEN: Soft, non-tender, non-distended EXTREMITIES:  No edema; No acute deformity   Asessement and Plan:.    1. CAD s/p CABG,  abnormal EKG - symptomatically stable without angina, feeling well. Reviewed abnormal EKG with Dr. Mona with inferior ST upsloping, TWI I, avL. In absence of any cardiac symptoms, question LVH or electrolyte abnormality. We will obtain echo and BMET/Mg today. The patient confirms he does not wish to be on ASA. He is not on BB due to sinus bradycardia which is stable and asymptomatic. See below regarding lipid management.   2. HLD - LDL 131 in 05/2023 while on rosuvastatin  20mg . He does not want to increase dose due to h/o myalgias with rosuvastatin  at lower dose in the past, as well as myalgias on simvastatin. He is not taking Zetia  any longer. He is not interested in PCSK9i due to giving himself injections. We discussed potential other options like bempedoic acid and inclisiran. He is amenable to speaking with pharm team lipid clinic. Will refer.  3. HTN - BP above goal in OV today but actually improved compared to prior office values. As in previous visits, he reports completely normal blood pressure when checking regularly at home. He will notify for any new concerns.  4. Mild MR, mild AI, mildly dilated aortic root with mildly elevated PASP in 2018 - patient agreeable for surveillance echo, will obtain.    Disposition: F/u with Dr. Verlin in 1 year, sooner if echo warrants.  Signed, Valicia Rief N Xavia Kniskern, PA-C

## 2023-07-11 ENCOUNTER — Ambulatory Visit: Attending: Physician Assistant | Admitting: Physician Assistant

## 2023-07-11 ENCOUNTER — Encounter: Payer: Self-pay | Admitting: Physician Assistant

## 2023-07-11 VITALS — BP 136/88 | HR 51 | Ht 70.0 in | Wt 179.0 lb

## 2023-07-11 DIAGNOSIS — I10 Essential (primary) hypertension: Secondary | ICD-10-CM | POA: Diagnosis not present

## 2023-07-11 DIAGNOSIS — I77810 Thoracic aortic ectasia: Secondary | ICD-10-CM

## 2023-07-11 DIAGNOSIS — I351 Nonrheumatic aortic (valve) insufficiency: Secondary | ICD-10-CM

## 2023-07-11 DIAGNOSIS — I2510 Atherosclerotic heart disease of native coronary artery without angina pectoris: Secondary | ICD-10-CM

## 2023-07-11 DIAGNOSIS — I340 Nonrheumatic mitral (valve) insufficiency: Secondary | ICD-10-CM | POA: Diagnosis not present

## 2023-07-11 DIAGNOSIS — R9431 Abnormal electrocardiogram [ECG] [EKG]: Secondary | ICD-10-CM

## 2023-07-11 DIAGNOSIS — E785 Hyperlipidemia, unspecified: Secondary | ICD-10-CM | POA: Diagnosis not present

## 2023-07-11 NOTE — Patient Instructions (Addendum)
 Medication Instructions:  No changes *If you need a refill on your cardiac medications before your next appointment, please call your pharmacy*  Lab Work: Today we are going to draw a Bmet and Mag level If you have labs (blood work) drawn today and your tests are completely normal, you will receive your results only by: MyChart Message (if you have MyChart) OR A paper copy in the mail If you have any lab test that is abnormal or we need to change your treatment, we will call you to review the results.  Testing/Procedures: Your physician has requested that you have an echocardiogram. Echocardiography is a painless test that uses sound waves to create images of your heart. It provides your doctor with information about the size and shape of your heart and how well your heart's chambers and valves are working. This procedure takes approximately one hour. There are no restrictions for this procedure. Please do NOT wear cologne, perfume, aftershave, or lotions (deodorant is allowed). Please arrive 15 minutes prior to your appointment time.  Please note: We ask at that you not bring children with you during ultrasound (echo/ vascular) testing. Due to room size and safety concerns, children are not allowed in the ultrasound rooms during exams. Our front office staff cannot provide observation of children in our lobby area while testing is being conducted. An adult accompanying a patient to their appointment will only be allowed in the ultrasound room at the discretion of the ultrasound technician under special circumstances. We apologize for any inconvenience.  Follow-Up: At [facility], you and your health needs are our priority.  As part of our continuing mission to provide you with exceptional heart care, our providers are all part of one team.  This team includes your primary Cardiologist (physician) and Advanced Practice Providers or APPs (Physician Assistants and Nurse Practitioners) who  all work together to provide you with the care you need, when you need it.  Your next appointment:   1 year(s)  Provider:   Lonni Cash, MD    We recommend signing up for the patient portal called MyChart.  Sign up information is provided on this After Visit Summary.  MyChart is used to connect with patients for Virtual Visits (Telemedicine).  Patients are able to view lab/test results, encounter notes, upcoming appointments, etc.  Non-urgent messages can be sent to your provider as well.   To learn more about what you can do with MyChart, go to [email or web address].   Other Instructions We are going to refer you to the Pharm D Lipid clinic

## 2023-07-12 ENCOUNTER — Ambulatory Visit: Payer: Self-pay | Admitting: Physician Assistant

## 2023-07-12 DIAGNOSIS — I77810 Thoracic aortic ectasia: Secondary | ICD-10-CM

## 2023-07-12 DIAGNOSIS — I351 Nonrheumatic aortic (valve) insufficiency: Secondary | ICD-10-CM

## 2023-07-12 DIAGNOSIS — I340 Nonrheumatic mitral (valve) insufficiency: Secondary | ICD-10-CM

## 2023-07-12 LAB — BASIC METABOLIC PANEL WITH GFR
BUN/Creatinine Ratio: 11 (ref 10–24)
BUN: 12 mg/dL (ref 8–27)
CO2: 21 mmol/L (ref 20–29)
Calcium: 9.5 mg/dL (ref 8.6–10.2)
Chloride: 101 mmol/L (ref 96–106)
Creatinine, Ser: 1.13 mg/dL (ref 0.76–1.27)
Glucose: 79 mg/dL (ref 70–99)
Potassium: 4.5 mmol/L (ref 3.5–5.2)
Sodium: 138 mmol/L (ref 134–144)
eGFR: 65 mL/min/{1.73_m2} (ref >59)

## 2023-07-12 LAB — MAGNESIUM: Magnesium: 2.3 mg/dL (ref 1.6–2.3)

## 2023-07-14 NOTE — Telephone Encounter (Signed)
 Patient returned staff call regarding results.

## 2023-07-19 NOTE — Progress Notes (Signed)
 Pt has been made aware of normal result and verbalized understanding.  jw

## 2023-07-27 ENCOUNTER — Telehealth: Payer: Self-pay | Admitting: *Deleted

## 2023-07-27 ENCOUNTER — Other Ambulatory Visit: Payer: Self-pay | Admitting: *Deleted

## 2023-07-27 DIAGNOSIS — I83892 Varicose veins of left lower extremities with other complications: Secondary | ICD-10-CM

## 2023-07-27 NOTE — Telephone Encounter (Signed)
 Returning telephone call regarding left lower extremity varicose vein bleeding episode.  Spoke with Mr. Wares wife.  She states they have the bleeding stopped. Reviewed with her the steps to take to stop varicose vein bleeding if it should restart.  Venous reflux exam (left leg, order in EPIC) scheduled for tomorrow 07-28-2023 at 11:30 AM followed by an appointment with PA at 12:30 PM.  Mr. Tatar verbalized understanding of instructions and appointments.

## 2023-07-28 ENCOUNTER — Ambulatory Visit: Attending: Vascular Surgery | Admitting: Physician Assistant

## 2023-07-28 ENCOUNTER — Ambulatory Visit (HOSPITAL_COMMUNITY)
Admission: RE | Admit: 2023-07-28 | Discharge: 2023-07-28 | Disposition: A | Discharge status: Home / Self Care | Source: Ambulatory Visit | Attending: Vascular Surgery

## 2023-07-28 ENCOUNTER — Encounter: Payer: Self-pay | Admitting: Physician Assistant

## 2023-07-28 VITALS — BP 164/72 | HR 56 | Temp 98.1°F | Resp 18 | Ht 70.0 in | Wt 180.6 lb

## 2023-07-28 DIAGNOSIS — I83899 Varicose veins of unspecified lower extremities with other complications: Secondary | ICD-10-CM

## 2023-07-28 DIAGNOSIS — I83892 Varicose veins of left lower extremities with other complications: Secondary | ICD-10-CM | POA: Insufficient documentation

## 2023-07-28 NOTE — Progress Notes (Signed)
 [facility]           [facility] [place]  History and Physical   [name] [name] is a 83 y.o. male who presents with hx of previous laser ablation of both GSV in the past.  He was last seen by Dr. Eliza in 2024 at which time he had significant deep venous reflux but no significant superficial reflux.  He had lots of varicose veins in both legs but not having any sx.    He comes in today for evaluation of bleeding varicose vein on the left leg.  He state he held pressure and it took a good while for it to stop bleeding.  His wife of 64 years purchased a powder that is supposed to help stop bleeding and it did help.  He has other varicosities he and his wife are worried about bleeding.    The pt is on a statin for cholesterol management.  The pt is not on a daily aspirin .   Other AC:  none The pt is not on medication for hypertension.   The pt is not on medication for diabetes.   Tobacco hx:  never  Pt does not have family hx of AAA.  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   no evidence of prostate cancer per pt 11-13-14   Allergy    BRADYCARDIA 09/20/2007   CAD (coronary artery disease)    CEREBROVASCULAR ACCIDENT, HX OF 09/20/2007   COLONIC POLYPS, HX OF 12/18/2006   DIVERTICULOSIS, COLON 09/20/2007   HYPERLIPIDEMIA 12/18/2006   PERIPHERAL VASCULAR DISEASE 09/20/2007   TIA 12/03/2007   Varicose veins     Past Surgical History:  Procedure Laterality Date   COLONOSCOPY  06/05/2009   last colon 06-05-2009 w/ patterson-polyps   COLONOSCOPY WITH PROPOFOL   11/03/2020   CORONARY ARTERY BYPASS GRAFT  02/10/2012   Procedure: CORONARY ARTERY BYPASS GRAFTING (CABG);  Surgeon: Maude Fleeta Ochoa, MD;  Location: [facility] OR;  Service: Open Heart Surgery;  Laterality: N/A;  CABG x three, using Bilateral mammary artery harvest and left arm radial harvest   INTRAOPERATIVE TRANSESOPHAGEAL ECHOCARDIOGRAM  02/10/2012   Procedure: INTRAOPERATIVE  TRANSESOPHAGEAL ECHOCARDIOGRAM;  Surgeon: Maude Fleeta Ochoa, MD;  Location: [facility] OR;  Service: Open Heart Surgery;  Laterality: N/A;   LEFT HEART CATHETERIZATION WITH CORONARY ANGIOGRAM N/A 02/10/2012   Procedure: LEFT HEART CATHETERIZATION WITH CORONARY ANGIOGRAM;  Surgeon: Lonni JONETTA Cash, MD;  Location: [facility] CATH LAB;  Service: Cardiovascular;  Laterality: N/A;   POLYPECTOMY     hx TA colon polyps   RADIAL ARTERY HARVEST  02/10/2012   Procedure: RADIAL ARTERY HARVEST;  Surgeon: Maude Fleeta Ochoa, MD;  Location: [facility] OR;  Service: Open Heart Surgery;  Laterality: Left;    Social History   Socioeconomic History   Marital status: Married    Spouse name: Not on file   Number of children: Not on file   Years of education: Not on file   Highest education level: Not on file  Occupational History   Not on file  Tobacco Use   Smoking status: Never    Passive exposure: Never   Smokeless tobacco: Never  Vaping Use   Vaping status: Never Used  Substance and Sexual Activity   Alcohol use: No    Alcohol/week: 0.0 standard drinks of alcohol   Drug use: No   Sexual activity: Not Currently  Other Topics Concern   Not on file  Social History Narrative   Married   Social Drivers of Corporate investment banker Strain: Low Risk  (05/09/2023)   Overall Financial Resource Strain (CARDIA)    Difficulty of Paying Living Expenses: Not hard at all  Food Insecurity: No Food Insecurity (05/09/2023)   Hunger Vital Sign    Worried About Running Out of Food in the Last Year: Never true    Ran Out of Food in the Last Year: Never true  Transportation Needs: No Transportation Needs (05/09/2023)   PRAPARE - Administrator, Civil Service (Medical): No    Lack of Transportation (Non-Medical): No  Physical Activity: Sufficiently Active (05/09/2023)   Exercise Vital Sign    Days of Exercise per Week: 5 days    Minutes of Exercise per Session: 30 min  Stress: No Stress Concern Present (05/09/2023)    [employer] of Occupational Health - Occupational Stress Questionnaire    Feeling of Stress : Not at all  Social Connections: Moderately Isolated (05/09/2023)   Social Connection and Isolation Panel    Frequency of Communication with Friends and Family: More than three times a week    Frequency of Social Gatherings with Friends and Family: More than three times a week    Attends Religious Services: Never    Database administrator or Organizations: Not on file    Attends Banker Meetings: Never    Marital Status: Married  Catering manager Violence: Not At Risk (05/09/2023)   Humiliation, Afraid, Rape, and Kick questionnaire    Fear of Current or Ex-Partner: No    Emotionally Abused: No    Physically Abused: No    Sexually Abused: No     Family History  Problem Relation Age of Onset   Cancer Brother    Colon cancer Brother 89       died age 1-found out 6 months before passing he had colon cancer   Heart disease Father     Current Outpatient Medications  Medication Sig Dispense Refill   BEE POLLEN PO Take by mouth daily.     dorzolamide-timolol (COSOPT) 2-0.5 % ophthalmic solution 1 drop 2 (two) times daily.     ezetimibe  (ZETIA ) 10 MG tablet Take 1 tablet (10 mg total) by mouth daily. (Patient not taking: Reported on 07/11/2023) 90 tablet 3   Multiple Vitamin (MULTIVITAMIN) tablet Take 1 tablet by mouth daily. Geritol- takes 5 days a week     Red Yeast Rice Extract (RED YEAST RICE PO) Take by mouth.     rosuvastatin  (CRESTOR ) 20 MG tablet Take 20 mg by mouth daily.     Saw Palmetto, Serenoa repens, (SAW PALMETTO PO) Take 1 tablet by mouth daily.     sildenafil  (VIAGRA ) 100 MG tablet Take 0.5-1 tablets (50-100 mg total) by mouth daily as needed. 10 tablet 11   triamcinolone cream (KENALOG) 0.1 % Use as directed     VEVYE 0.1 % SOLN Apply 1 drop to eye 2 (two) times daily.     vitamin C (ASCORBIC ACID) 500 MG tablet Take 500 mg by mouth daily. 5 days a week      No current facility-administered medications for this visit.    Allergies  Allergen Reactions   Niacin     REACTION: hives   Simvastatin Other (See Comments)    myalgias    REVIEW OF SYSTEMS:   [X]  denotes positive finding, [ ]  denotes negative finding Cardiac  Comments:  Chest pain or chest  pressure:    Shortness of breath upon exertion:    Short of breath when lying flat:    Irregular heart rhythm:        Vascular    Pain in calf, thigh, or hip brought on by ambulation:    Pain in feet at night that wakes you up from your sleep:     Blood clot in your veins:    Leg swelling:  x       Pulmonary    Oxygen at home:    Productive cough:     Wheezing:         Neurologic    Sudden weakness in arms or legs:     Sudden numbness in arms or legs:     Sudden onset of difficulty speaking or slurred speech:    Temporary loss of vision in one eye:     Problems with dizziness:         Gastrointestinal    Blood in stool:     Vomited blood:         Genitourinary    Burning when urinating:     Blood in urine:        Psychiatric    Major depression:         Hematologic    Bleeding problems:    Problems with blood clotting too easily:        Skin    Rashes or ulcers:        Constitutional    Fever or chills:      PHYSICAL EXAMINATION:  Today's Vitals   07/28/23 1218  BP: (!) 164/72  Pulse: (!) 56  Resp: 18  Temp: 98.1 F (36.7 C)  TempSrc: Temporal  SpO2: 99%  Weight: 180 lb 9.6 oz (81.9 kg)  Height: 5' 10 (1.778 m)   Body mass index is 25.91 kg/m.   General:  WDWN in NAD; vital signs documented above Gait: Not observed HENT: WNL, normocephalic Pulmonary: normal non-labored breathing without wheezing Cardiac: regular HR; without carotid bruits Abdomen: soft, NT, aortic pulse is not palpable Skin: without rashes Vascular Exam/Pulses: Right radial pulse is palpable; left radial has been harvested DP pulses are palpable bilaterally Extremities:  large varicosities BLE as pictured.   Left lateral lower leg (area that bled)   Right foot   Right medial ankle   Left medial leg   Left lateral leg   Left medial leg   Right leg   Right lateral leg   BLE posterior    Neurologic: A&O X 3;  moving all extremities equally Psychiatric:  The pt has Normal affect.   Non-Invasive Vascular Imaging:   Venous duplex on 07/28/2023: +------------------+---------+------+-----------+------------+-------------  ----+  LEFT             Reflux NoRefluxReflux TimeDiameter cmsComments                                  Yes                                       +------------------+---------+------+-----------+------------+-------------  CFV              no                                                  +------------------+---------+------+-----------+------------+-------------  FV mid                      yes   >1 second                           +------------------+---------+------+-----------+------------+-------------  Popliteal                  yes   >1 second                           +------------------+---------+------+-----------+------------+-------------  GSV prox thigh                                           prior  ablation/stripping              +------------------+---------+------+-----------+------------+-------------  GSV mid thigh                                            prior ablation/stripping             +------------------+---------+------+-----------+------------+-------------  ----+ GSV dist thigh                                           prior ablation/stripping             +------------------+---------+------+-----------+------------+-------------  GSV at knee                 yes    >500 ms      0.56     Appears to Arise from posterior thigh         +------------------+---------+------+-----------+------------+-------------  GSV prox calf                yes    >500 ms              Partial  chronic thrombus      +------------------+---------+------+-----------+------------+-------------  GSV mid calf                                            total  chronic thrombus      +------------------+---------+------+-----------+------------+-------------  GSV dist calf                                           total  chronic thrombus      +------------------+---------+------+-----------+------------+-------------  SSV at [facility]        no                            0.24                  +------------------+---------+------+-----------+------------+-------------  SSV mid calf      no  0.18                  +------------------+---------+------+-----------+------------+-------------  distal lateral              yes    >500 ms                            thigh                                                                 +------------------+---------+------+-----------+------------+-------------    Summary:  Left:  - No evidence of deep vein thrombosis seen in the left lower extremity,  from the common femoral through the popliteal veins.  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   - Deep vein reflux in the FV and popliteal vein.   -Superficial vein reflux in the GSV at the knee and proximal calf, and in  the AASV distal lateral thigh. AASV is tortuous.     [name] [name] is a 83 y.o. male who presents with: recent bleeding from varicosity left lower lateral leg.     -pt has palpable pedal pulses -pt does not have evidence of DVT.  Pt does have venous reflux in the deep venous system on the left as well as the GSV at the knee and distal lateral thigh.   -pt currently unable to wear compression.  I wrapped his left lower leg from the foot to his knee with moderate compression.  Advised him to take off at night and put on in the morning.   -discussed  that if he should have any further bleeding, how to position himself and hold single digit pressure for 30 minutes.   -given he had some bleeding, will have In[facility] call him regarding sclerotherapy.  He also has other areas that are concerning for possible bleeding and therefore will also get him scheduled with vein MD for possible stab phlebectomy if they feel he is a good candidate.   -handout with recommendations given    Brode Sculley, [facility] [phone]  Clinic MD:  Serene on call MD

## 2023-08-09 ENCOUNTER — Encounter: Payer: Self-pay | Admitting: Pharmacist

## 2023-08-09 ENCOUNTER — Ambulatory Visit: Attending: Cardiovascular Disease | Admitting: Pharmacist

## 2023-08-09 DIAGNOSIS — E7849 Other hyperlipidemia: Secondary | ICD-10-CM

## 2023-08-09 MED ORDER — ATORVASTATIN CALCIUM 20 MG PO TABS: 20.0000 mg | ORAL_TABLET | Freq: Every day | ORAL | 3 refills | Status: AC

## 2023-08-09 NOTE — Progress Notes (Signed)
 Patient ID: [name] [name]                 DOB: 1941/01/16                    MRN: [id number]      HPI: [name] [name] is a 83 y.o. male patient referred to lipid clinic by Raphael Bring, PA-C. 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   He has not tolerated statins and did not wish to pursue PCSK9i. He has not tolerated beta blockers due to bradycardia. He has a history of whitecoat HTN.  he is taking rosuvastatin  20mg  but not Zetia  due to muscle aches. He had prior myalgias on simvastatin as well as even lower doses of rosuvastatin  in the past therefore does not want to increase dose of rosuvastatin . He states the rosuvastatin  was added to his regimen last year (03/2022). Lipid panel in 05/2023 showed LDL 131 - he thinks he was taking rosuvastatin  at the time this was obtained (previous values 170s).   The patient presented today for follow-up in the lipid clinic. He reported that he discontinued statin therapy due to experiencing sluggishness and muscle aches. As a result, his primary care provider initiated ezetimibe  (Zetia ) in May 2025, which he has been tolerating well. However, he is firm in his decision to take only one cholesterol-lowering medication and refuses to use injectable therapies. At nearly 83 years old, he expresses satisfaction with his current health and a desire to avoid taking multiple medications. We discussed various options for lowering LDL cholesterol, including statins, ezetimibe , PCSK9 inhibitors, bempedoic acid, and inclisiran. The conversation included a review of mechanisms of action, dosing schedules, potential side effects, expected LDL reductions, cost considerations, and available patient assistance programs. He is willing to try a different statin but prefers to discontinue ezetimibe  if a statin  is restarted. He remains engaged in shared decision-making and clear about his treatment preferences.  Current Medications: Crestor  20 mg daily, Zetia  10 mg daily  Intolerances: simvastatin and 40 mg rosuvastatin   Risk Factors: CAD s/p 3V CABG 2014,hx of CVA, chronic venous insufficiency, hypertension,  LDL goal: <70 mg/dl  Last lab: TC 800, TG 72, HDL 54.40, LDL 131   Diet: eats whatever he likes to eat   Exercise: walking - 1 mile 3-4 days a week if weather permits and takes steps when go to work   Family History:  Relation Problem Comments  Mother (Deceased)   Father (Deceased) Heart disease     Brother (Deceased) Cancer   Colon cancer (Age: 23) died age 57-found out 6 months before passing he had colon cancer     Social History:  Alcohol: none  Smoking: never   Labs:  Lipid Panel     Component Value Date/Time   CHOL 199 05/19/2023 1020   CHOL 173 09/06/2019 0817   TRIG 72.0 05/19/2023 1020   TRIG 61 12/16/2005 0753   HDL 54.40 05/19/2023 1020   HDL 57 09/06/2019 0817   CHOLHDL 4 05/19/2023 1020   VLDL 14.4 05/19/2023 1020   LDLCALC 131 (H) 05/19/2023 1020   LDLCALC 106 (H) 09/06/2019 0817   LDLDIRECT 152.0 02/25/2022 1503   LABVLDL 10 09/06/2019 0817    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   no evidence of  prostate cancer per pt 11-13-14   Allergy    BRADYCARDIA 09/20/2007   CAD (coronary artery disease)    CEREBROVASCULAR ACCIDENT, HX OF 09/20/2007   COLONIC POLYPS, HX OF 12/18/2006   DIVERTICULOSIS, COLON 09/20/2007   HYPERLIPIDEMIA 12/18/2006   PERIPHERAL VASCULAR DISEASE 09/20/2007   TIA 12/03/2007   Varicose veins     Current Outpatient Medications on File Prior to Visit  Medication Sig Dispense Refill   BEE POLLEN PO Take by mouth daily.     dorzolamide-timolol (COSOPT) 2-0.5 % ophthalmic solution 1 drop 2 (two) times daily.     ezetimibe  (ZETIA ) 10 MG tablet Take 1 tablet (10 mg total) by mouth daily. 90 tablet 3    Multiple Vitamin (MULTIVITAMIN) tablet Take 1 tablet by mouth daily. Geritol- takes 5 days a week     Saw Palmetto, Serenoa repens, (SAW PALMETTO PO) Take 1 tablet by mouth daily.     sildenafil  (VIAGRA ) 100 MG tablet Take 0.5-1 tablets (50-100 mg total) by mouth daily as needed. 10 tablet 11   triamcinolone cream (KENALOG) 0.1 % Use as directed     VEVYE 0.1 % SOLN Apply 1 drop to eye 2 (two) times daily.     vitamin C (ASCORBIC ACID) 500 MG tablet Take 500 mg by mouth daily. 5 days a week     No current facility-administered medications on file prior to visit.    Allergies  Allergen Reactions   Niacin     REACTION: hives   Simvastatin Other (See Comments)    myalgias    Assessment/Plan:  1. Hyperlipidemia -  Problem  Hyperlipidemia   Qualifier: Diagnosis of  By: Sherron CMA, Steen      Hyperlipidemia Assessment:  LDL goal: < 70 mg/dl last LDLc 868 mg/dl (94/7974) pt unsure he was on stain or not  Intolerance to rosuvastatin  20 mg due to sluggishness and muscle aches, statin was stopped and zeta was started, tolerates Zetia  well without any issue  Very firm on taking only one medication for cholesterol. Refuse to try any injectable therapies ( PCSK9i or inclisiran)  Discussed all potential options (Stains, Zetia , PCSK-9 inhibitors, bempedoic acid and inclisiran); cost, dosing efficacy, side effects  Eats whatever he likes to eat - eats home cooked meals, exercise 3-4 days a week and stays active around the house doing yard work  Plan: As patient wants to be on only one medication and willing to try different stain will switch Zetia  to atorvastatin  20 mg daily patient to self adjust dose to 10 mg if 20 mg dose is not tolerated well and inform us  the max tolerated dose Follow up lab in 3 months - LFT and FLP     Thank you,  Robbi Blanch, Pharm.D [name] [address], [place], [place] [address] Phone: [phone]; Fax: [phone]

## 2023-08-09 NOTE — Patient Instructions (Signed)
 Your Results:             Your most recent labs Goal  Total Cholesterol 199 < 200  Triglycerides 72 < 150  HDL (happy/good cholesterol) 54 > 40  LDL (lousy/bad cholesterol 131 < 70   Medication changes: stop taking Red Yeast Rice and ezetimibe  10 mg daily. Start taking atorvastatin  20 mg daily.   Lab orders: We want to repeat labs after 2-3 months.  We will send you a lab order to remind you once we get closer to that time.

## 2023-08-09 NOTE — Assessment & Plan Note (Signed)
 Assessment:  LDL goal: < 70 mg/dl last LDLc 868 mg/dl (94/7974) pt unsure he was on stain or not  Intolerance to rosuvastatin  20 mg due to sluggishness and muscle aches, statin was stopped and zeta was started, tolerates Zetia  well without any issue  Very firm on taking only one medication for cholesterol. Refuse to try any injectable therapies ( PCSK9i or inclisiran)  Discussed all potential options (Stains, Zetia , PCSK-9 inhibitors, bempedoic acid and inclisiran); cost, dosing efficacy, side effects  Eats whatever he likes to eat - eats home cooked meals, exercise 3-4 days a week and stays active around the house doing yard work  Plan: As patient wants to be on only one medication and willing to try different stain will switch Zetia  to atorvastatin  20 mg daily patient to self adjust dose to 10 mg if 20 mg dose is not tolerated well and inform us  the max tolerated dose Follow up lab in 3 months - LFT and FLP

## 2023-08-23 ENCOUNTER — Ambulatory Visit (HOSPITAL_COMMUNITY)

## 2023-09-11 ENCOUNTER — Ambulatory Visit: Attending: Surgery | Admitting: Surgery

## 2023-09-11 ENCOUNTER — Encounter: Payer: Self-pay | Admitting: Surgery

## 2023-09-11 VITALS — BP 171/77 | HR 51 | Temp 98.0°F | Ht 70.0 in | Wt 184.0 lb

## 2023-09-11 DIAGNOSIS — I83893 Varicose veins of bilateral lower extremities with other complications: Secondary | ICD-10-CM | POA: Diagnosis not present

## 2023-09-11 NOTE — Progress Notes (Signed)
 [facility] [place]  Patient name: [name] [name] MRN: [id number] DOB: 03-07-1940 Sex: male   REASON FOR VISIT:    Follow-up  HISOTRY OF PRESENT ILLNESS:    [name] [name] is a 83 y.o. male with history of bilateral saphenous vein ablation in the past.  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  He has had several episodes of bleeding.  Fortunately he was able to get these to stop before having to go to the hospital.  He is back today because of pain and concern of her future bleeds from the spider veins.   PAST MEDICAL HISTORY:   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   no evidence of prostate cancer per pt 11-13-14   Allergy    BRADYCARDIA 09/20/2007   CAD (coronary artery disease)    CEREBROVASCULAR ACCIDENT, HX OF 09/20/2007   COLONIC POLYPS, HX OF 12/18/2006   DIVERTICULOSIS, COLON 09/20/2007   HYPERLIPIDEMIA 12/18/2006   PERIPHERAL VASCULAR DISEASE 09/20/2007   TIA 12/03/2007   Varicose veins      FAMILY HISTORY:   Family History  Problem Relation Age of Onset   Cancer Brother    Colon cancer Brother 27       died age 40-found out 6 months before passing he had colon cancer   Heart disease Father     SOCIAL HISTORY:   Social History   Tobacco Use   Smoking status: Never    Passive exposure: Never   Smokeless tobacco: Never  Substance Use Topics   Alcohol use: No    Alcohol/week: 0.0 standard drinks of alcohol     ALLERGIES:   Allergies  Allergen Reactions   Niacin     REACTION: hives   Simvastatin Other (See Comments)    myalgias     CURRENT MEDICATIONS:   Current Outpatient Medications  Medication Sig Dispense Refill   atorvastatin  (LIPITOR) 20 MG tablet Take 1 tablet (20 mg total) by mouth daily. 90 tablet 3   BEE POLLEN PO Take by mouth daily.     dorzolamide-timolol (COSOPT) 2-0.5 % ophthalmic solution 1 drop 2  (two) times daily.     ezetimibe  (ZETIA ) 10 MG tablet Take 1 tablet (10 mg total) by mouth daily. 90 tablet 3   Multiple Vitamin (MULTIVITAMIN) tablet Take 1 tablet by mouth daily. Geritol- takes 5 days a week     Saw Palmetto, Serenoa repens, (SAW PALMETTO PO) Take 1 tablet by mouth daily.     sildenafil  (VIAGRA ) 100 MG tablet Take 0.5-1 tablets (50-100 mg total) by mouth daily as needed. 10 tablet 11   triamcinolone cream (KENALOG) 0.1 % Use as directed     VEVYE 0.1 % SOLN Apply 1 drop to eye 2 (two) times daily.     vitamin C (ASCORBIC ACID) 500 MG tablet Take 500 mg by mouth daily. 5 days a week     No current facility-administered medications for this visit.    REVIEW OF SYSTEMS:   [X]  denotes positive finding, [ ]  denotes negative finding Cardiac  Comments:  Chest pain or chest pressure:    Shortness of breath upon exertion:    Short of breath when lying flat:    Irregular heart rhythm:        Vascular    Pain in calf, thigh, or hip brought on by ambulation:    Pain in feet at night that wakes you up  from your sleep:     Blood clot in your veins:    Leg swelling:         Pulmonary    Oxygen at home:    Productive cough:     Wheezing:         Neurologic    Sudden weakness in arms or legs:     Sudden numbness in arms or legs:     Sudden onset of difficulty speaking or slurred speech:    Temporary loss of vision in one eye:     Problems with dizziness:         Gastrointestinal    Blood in stool:     Vomited blood:         Genitourinary    Burning when urinating:     Blood in urine:        Psychiatric    Major depression:         Hematologic    Bleeding problems:    Problems with blood clotting too easily:        Skin    Rashes or ulcers:        Constitutional    Fever or chills:      PHYSICAL EXAM:   Vitals:   09/11/23 0955  BP: (!) 171/77  Pulse: (!) 51  Temp: 98 F (36.7 C)  SpO2: 97%  Weight: 184 lb (83.5 kg)  Height: 5' 10 (1.778 m)     GENERAL: The patient is a well-nourished male, in no acute distress. The vital signs are documented above. CARDIAC: There is a regular rate and rhythm.  VASCULAR: Large bilateral varicosities and very prominent spider/reticular veins with signs of recent bleed PULMONARY: Non-labored respirations MUSCULOSKELETAL: There are no major deformities or cyanosis. NEUROLOGIC: No focal weakness or paresthesias are detected. SKIN: There are no ulcers or rashes noted. PSYCHIATRIC: The patient has a normal affect.  STUDIES:   I have reviewed the following: LEFT              Reflux NoRefluxReflux TimeDiameter cmsComments                                        Yes                                             +------------------+---------+------+-----------+------------+-------------  ----+  CFV              no                                                        +------------------+---------+------+-----------+------------+-------------  ----+  FV mid                      yes   >1 second                                 +------------------+---------+------+-----------+------------+-------------  ----+  Popliteal                  yes   >  1 second                                 +------------------+---------+------+-----------+------------+-------------  ----+  GSV prox thigh                                          prior                                                                        ablation/strippin                                                         g                   +------------------+---------+------+-----------+------------+-------------  ----+  GSV mid thigh                                           prior                                                                        ablation/strippin                                                         g                    +------------------+---------+------+-----------+------------+-------------  ----+  GSV dist thigh                                          prior                                                                        ablation/strippin  g                   +------------------+---------+------+-----------+------------+-------------  ----+  GSV at knee                 yes    >500 ms      0.56    Appears to  arise                                                           from  posterior                                                             thigh               +------------------+---------+------+-----------+------------+-------------  ----+  GSV prox calf               yes    >500 ms              Partial  chronic                                                            thrombus            +------------------+---------+------+-----------+------------+-------------  ----+  GSV mid calf                                            total  chronic                                                              thrombus            +------------------+---------+------+-----------+------------+-------------  ----+  GSV dist calf                                           total  chronic                                                              thrombus            +------------------+---------+------+-----------+------------+-------------  ----+  SSV at [facility]        no  0.24                        +------------------+---------+------+-----------+------------+-------------  ----+  SSV mid calf      no                            0.18                        +------------------+---------+------+-----------+------------+-------------  ----+  distal lateral              yes    >500 ms                                   thigh                                                                       +------------------+---------+------+-----------+------------+-------------   MEDICAL ISSUES:   Chronic venous insufficiency: The patient is status post saphenous vein ablation bilaterally.  He has numerous bilateral varicosities that are asymptomatic.  Unfortunately he has had several episodes of bleeding, that have been able to be controlled without going to the emergency department.  These areas are causing discomfort even to the point where he has had to change his shoes he wears.  He is also concerned about future bleeds.  I think step is to treat the concerning veins with sclerotherapy to minimize his risk for bleeding.  I had Inocente evaluate him today near future.    Malvina Serene CLORE, MD, FACS [facility] [phone] Pager [phone]

## 2023-09-29 ENCOUNTER — Ambulatory Visit (HOSPITAL_COMMUNITY)
Admission: RE | Admit: 2023-09-29 | Discharge: 2023-09-29 | Disposition: A | Discharge status: Home / Self Care | Source: Ambulatory Visit | Attending: Physician Assistant | Admitting: Physician Assistant

## 2023-09-29 DIAGNOSIS — R9431 Abnormal electrocardiogram [ECG] [EKG]: Secondary | ICD-10-CM | POA: Diagnosis not present

## 2023-09-29 LAB — ECHOCARDIOGRAM COMPLETE
Area-P 1/2: 2.05 cm2
P 1/2 time: 747 ms
S' Lateral: 3 cm

## 2023-09-29 NOTE — Addendum Note (Signed)
 Addended by: MEMORY DELON POUR on: 09/29/2023 03:12 PM   Modules accepted: Orders

## 2023-10-03 ENCOUNTER — Telehealth: Payer: Self-pay | Admitting: Cardiovascular Disease

## 2023-10-03 DIAGNOSIS — I77810 Thoracic aortic ectasia: Secondary | ICD-10-CM

## 2023-10-03 DIAGNOSIS — Z01812 Encounter for preprocedural laboratory examination: Secondary | ICD-10-CM

## 2023-10-03 NOTE — Telephone Encounter (Signed)
 [name] [name], RMA 09/29/2023  3:12 PM EDT     Pt made aware of Echocardiogram results / recommendations. Pt does decline CT Aorta and said will just plan a repeat Echo in 1 year.   Precautions given to pt re: Aneurysm precautions.    No DPR on file to speak with wife.  Attempted to contact pt who was not at home.  Advised he can call back with verbal approval.  Pt will also need to be reminded to complete and sign DPR at his next office visit.

## 2023-10-03 NOTE — Telephone Encounter (Signed)
 Wife is calling for someone to go over Echo results with her. She is wanting to set up the CTA.

## 2023-10-04 ENCOUNTER — Ambulatory Visit

## 2023-10-04 DIAGNOSIS — L570 Actinic keratosis: Secondary | ICD-10-CM | POA: Diagnosis not present

## 2023-10-04 DIAGNOSIS — X32XXXD Exposure to sunlight, subsequent encounter: Secondary | ICD-10-CM | POA: Diagnosis not present

## 2023-10-04 NOTE — Progress Notes (Signed)
 Pt had been approved for 3 units of sclerotherapy from 10/02/23-12/31/23. He has had bleeding varicosity of LLE. He came in today for treatment and after reading consent and changing into shorts, pt stated he did not feel comfortable with proceeding. He stated he had this done with another provider at our office in 2019 after his vein procedure and felt it made things worse. We talked through this a bit and was offered to speak with MD here. Pt felt that was not necessary and would not be worthwhile as his mind was made up. He was advised on how to hold pressure and elevate his leg, should it bleed again in the future. He will call us  if he has any further questions/concerns that arise.

## 2023-10-09 ENCOUNTER — Other Ambulatory Visit: Payer: Self-pay | Admitting: *Deleted

## 2023-10-09 DIAGNOSIS — Z01812 Encounter for preprocedural laboratory examination: Secondary | ICD-10-CM

## 2023-10-09 DIAGNOSIS — I77810 Thoracic aortic ectasia: Secondary | ICD-10-CM

## 2023-10-09 NOTE — Telephone Encounter (Signed)
 Patient is returning phone call to schedule his CT Aorta patient mentioned he would a DPR on file for his wife patient stated he does have difficulty coming into the office and would like to know if their is another option for him to sign the DPR. Please Advise

## 2023-10-09 NOTE — Telephone Encounter (Signed)
 I spoke with patient and he would like to have CT Scan done.  He will stop by [employer] on the first floor to have BMP done this week.  Patient will complete DPR paperwork when here for lab work or CT scan. Patient did give permission to speak with his wife.

## 2023-10-09 NOTE — Telephone Encounter (Signed)
 I returned call to patient and spoke with his  wife.  She reports he will be home around 5:30 today.  I told her I would call patient back later today after 5:30

## 2023-10-11 ENCOUNTER — Encounter: Payer: Self-pay | Admitting: Pharmacist

## 2023-10-11 ENCOUNTER — Ambulatory Visit: Payer: Self-pay | Admitting: Physician Assistant

## 2023-10-11 LAB — BASIC METABOLIC PANEL WITH GFR
BUN/Creatinine Ratio: 10 (ref 10–24)
BUN: 11 mg/dL (ref 8–27)
CO2: 21 mmol/L (ref 20–29)
Calcium: 8.9 mg/dL (ref 8.6–10.2)
Chloride: 102 mmol/L (ref 96–106)
Creatinine, Ser: 1.11 mg/dL (ref 0.76–1.27)
Glucose: 86 mg/dL (ref 70–99)
Potassium: 4.5 mmol/L (ref 3.5–5.2)
Sodium: 138 mmol/L (ref 134–144)
eGFR: 66 mL/min/1.73 (ref >59)

## 2023-10-11 NOTE — Progress Notes (Signed)
 Pt has been made aware of normal result and verbalized understanding.  jw

## 2023-10-11 NOTE — Progress Notes (Signed)
 Pharmacy Quality Measure Review  This patient is appearing on a report for being at risk of failing the adherence measure for cholesterol (statin) medications this calendar year.   Medication: Rosuvastatin  Last fill date: 02/11/23 for 90 day supply  Rosuvastatin  discontinued. Atorvastatin  started and filled 08/09/23 for 90DS. Insurance report was not up to date. No action needed at this time.    Darrelyn Drum, PharmD, BCPS, CPP Clinical Pharmacist Practitioner [facility] [phone]

## 2023-10-16 DIAGNOSIS — H04123 Dry eye syndrome of bilateral lacrimal glands: Secondary | ICD-10-CM | POA: Diagnosis not present

## 2023-10-16 DIAGNOSIS — H524 Presbyopia: Secondary | ICD-10-CM | POA: Diagnosis not present

## 2023-10-16 DIAGNOSIS — H401111 Primary open-angle glaucoma, right eye, mild stage: Secondary | ICD-10-CM | POA: Diagnosis not present

## 2023-10-16 DIAGNOSIS — H31092 Other chorioretinal scars, left eye: Secondary | ICD-10-CM | POA: Diagnosis not present

## 2023-10-16 DIAGNOSIS — H401122 Primary open-angle glaucoma, left eye, moderate stage: Secondary | ICD-10-CM | POA: Diagnosis not present

## 2023-10-16 LAB — OPHTHALMOLOGY REPORT-SCANNED

## 2023-12-07 ENCOUNTER — Telehealth: Payer: Self-pay | Admitting: *Deleted

## 2023-12-07 NOTE — Telephone Encounter (Signed)
 Scheduling has been trying to contact patient to schedule CTA that was ordered in September based on echo results.  Brittany was able to reach patient who requested call to explain reason for the test.  I placed call to patient and left message to call office

## 2023-12-12 ENCOUNTER — Telehealth: Payer: Self-pay | Admitting: Internal Medicine

## 2023-12-12 DIAGNOSIS — E782 Mixed hyperlipidemia: Secondary | ICD-10-CM

## 2023-12-12 NOTE — Telephone Encounter (Signed)
 Left message to call office.

## 2023-12-12 NOTE — Progress Notes (Signed)
 Pharmacy Quality Measure Review  This patient is appearing on a report for being at risk of failing the adherence measure for cholesterol (statin) medications this calendar year.   Medication: Atorvastatin  20mg  Last fill date: 10/28 for 90 day supply  Has enough supply based on last refill, but documentation in chart notes this med is ended, which is unclear. Spoke with patient, is taking a statin, and believe sit is atorvastatin . Does not need refills and does not have questions at this time.  Inquired about Dr. Norleen leaving his current practice. Would like to be seen at [facility], close to his work. Provided them with their phone number to inquire about next steps with establishing PCP care at this location.  Angela Baalmann, PharmD [facility] Pharmacist

## 2023-12-15 ENCOUNTER — Ambulatory Visit (HOSPITAL_COMMUNITY)

## 2023-12-18 ENCOUNTER — Ambulatory Visit (HOSPITAL_COMMUNITY)
Admission: RE | Admit: 2023-12-18 | Discharge: 2023-12-18 | Disposition: A | Source: Ambulatory Visit | Attending: Physician Assistant | Admitting: Physician Assistant

## 2023-12-18 DIAGNOSIS — K449 Diaphragmatic hernia without obstruction or gangrene: Secondary | ICD-10-CM | POA: Diagnosis not present

## 2023-12-18 DIAGNOSIS — I77810 Thoracic aortic ectasia: Secondary | ICD-10-CM | POA: Diagnosis not present

## 2023-12-18 DIAGNOSIS — I700 Atherosclerosis of aorta: Secondary | ICD-10-CM | POA: Diagnosis not present

## 2023-12-18 DIAGNOSIS — J929 Pleural plaque without asbestos: Secondary | ICD-10-CM | POA: Diagnosis not present

## 2023-12-18 DIAGNOSIS — I7121 Aneurysm of the ascending aorta, without rupture: Secondary | ICD-10-CM | POA: Diagnosis not present

## 2023-12-18 MED ORDER — IOHEXOL 350 MG/ML SOLN
75.0000 mL | Freq: Once | INTRAVENOUS | Status: AC | PRN
  Administered 2023-12-18: 75 mL via INTRAVENOUS

## 2023-12-18 NOTE — Telephone Encounter (Signed)
CT was done today

## 2024-05-16 ENCOUNTER — Encounter: Admitting: Internal Medicine

## 2024-05-16 ENCOUNTER — Ambulatory Visit
